# Patient Record
Sex: Male | Born: 1940
Health system: Southern US, Community
[De-identification: ages and names within clinical notes are randomized; demographics above are authoritative.]

## PROBLEM LIST (undated history)

## (undated) DIAGNOSIS — D126 Benign neoplasm of colon, unspecified: Secondary | ICD-10-CM

## (undated) DIAGNOSIS — M069 Rheumatoid arthritis, unspecified: Secondary | ICD-10-CM

## (undated) DIAGNOSIS — K449 Diaphragmatic hernia without obstruction or gangrene: Secondary | ICD-10-CM

## (undated) DIAGNOSIS — Z9889 Other specified postprocedural states: Secondary | ICD-10-CM

## (undated) DIAGNOSIS — K5790 Diverticulosis of intestine, part unspecified, without perforation or abscess without bleeding: Secondary | ICD-10-CM

## (undated) DIAGNOSIS — K219 Gastro-esophageal reflux disease without esophagitis: Secondary | ICD-10-CM

## (undated) DIAGNOSIS — I10 Essential (primary) hypertension: Secondary | ICD-10-CM

## (undated) DIAGNOSIS — G4733 Obstructive sleep apnea (adult) (pediatric): Secondary | ICD-10-CM

## (undated) DIAGNOSIS — J849 Interstitial pulmonary disease, unspecified: Secondary | ICD-10-CM

## (undated) DIAGNOSIS — K649 Unspecified hemorrhoids: Secondary | ICD-10-CM

## (undated) DIAGNOSIS — J9611 Chronic respiratory failure with hypoxia: Secondary | ICD-10-CM

## (undated) DIAGNOSIS — Z8719 Personal history of other diseases of the digestive system: Secondary | ICD-10-CM

## (undated) DIAGNOSIS — Z9989 Dependence on other enabling machines and devices: Secondary | ICD-10-CM

## (undated) HISTORY — DX: Gastro-esophageal reflux disease without esophagitis: K21.9

## (undated) HISTORY — PX: CIRCUMCISION: SUR203

## (undated) HISTORY — DX: Other specified postprocedural states: Z98.890

## (undated) HISTORY — DX: Personal history of other diseases of the digestive system: Z87.19

## (undated) HISTORY — DX: Essential (primary) hypertension: I10

## (undated) HISTORY — DX: Unspecified hemorrhoids: K64.9

## (undated) HISTORY — DX: Diaphragmatic hernia without obstruction or gangrene: K44.9

## (undated) HISTORY — DX: Benign neoplasm of colon, unspecified: D12.6

## (undated) HISTORY — DX: Rheumatoid arthritis, unspecified: M06.9

## (undated) HISTORY — DX: Diverticulosis of intestine, part unspecified, without perforation or abscess without bleeding: K57.90

---

## 1984-02-25 ENCOUNTER — Encounter: Payer: Self-pay | Admitting: Internal Medicine

## 1985-08-24 ENCOUNTER — Encounter: Payer: Self-pay | Admitting: Internal Medicine

## 1999-05-03 ENCOUNTER — Encounter: Payer: Self-pay | Admitting: General Surgery

## 1999-05-03 ENCOUNTER — Encounter: Admission: RE | Admit: 1999-05-03 | Discharge: 1999-05-03 | Payer: Self-pay | Admitting: General Surgery

## 1999-05-03 ENCOUNTER — Encounter: Payer: Self-pay | Admitting: Internal Medicine

## 1999-05-07 ENCOUNTER — Encounter: Payer: Self-pay | Admitting: General Surgery

## 1999-05-07 ENCOUNTER — Encounter: Admission: RE | Admit: 1999-05-07 | Discharge: 1999-05-07 | Payer: Self-pay | Admitting: General Surgery

## 1999-05-08 ENCOUNTER — Encounter (INDEPENDENT_AMBULATORY_CARE_PROVIDER_SITE_OTHER): Payer: Self-pay | Admitting: *Deleted

## 1999-05-08 ENCOUNTER — Ambulatory Visit (HOSPITAL_BASED_OUTPATIENT_CLINIC_OR_DEPARTMENT_OTHER): Admission: RE | Admit: 1999-05-08 | Discharge: 1999-05-08 | Payer: Self-pay | Admitting: General Surgery

## 2000-04-29 HISTORY — PX: HEMORRHOID SURGERY: SHX153

## 2001-02-09 ENCOUNTER — Encounter: Payer: Self-pay | Admitting: Internal Medicine

## 2001-02-10 ENCOUNTER — Other Ambulatory Visit: Admission: RE | Admit: 2001-02-10 | Discharge: 2001-02-10 | Payer: Self-pay | Admitting: Internal Medicine

## 2001-02-10 ENCOUNTER — Encounter (INDEPENDENT_AMBULATORY_CARE_PROVIDER_SITE_OTHER): Payer: Self-pay | Admitting: Specialist

## 2001-02-10 ENCOUNTER — Encounter: Payer: Self-pay | Admitting: Internal Medicine

## 2001-02-10 DIAGNOSIS — D126 Benign neoplasm of colon, unspecified: Secondary | ICD-10-CM | POA: Insufficient documentation

## 2001-02-18 ENCOUNTER — Ambulatory Visit (HOSPITAL_COMMUNITY): Admission: RE | Admit: 2001-02-18 | Discharge: 2001-02-18 | Payer: Self-pay | Admitting: Internal Medicine

## 2001-02-18 ENCOUNTER — Encounter: Payer: Self-pay | Admitting: Internal Medicine

## 2001-02-18 DIAGNOSIS — K210 Gastro-esophageal reflux disease with esophagitis: Secondary | ICD-10-CM

## 2004-10-14 ENCOUNTER — Emergency Department (HOSPITAL_COMMUNITY): Admission: EM | Admit: 2004-10-14 | Discharge: 2004-10-14 | Payer: Self-pay | Admitting: Emergency Medicine

## 2004-10-24 ENCOUNTER — Ambulatory Visit: Payer: Self-pay | Admitting: Internal Medicine

## 2005-12-18 ENCOUNTER — Ambulatory Visit: Payer: Self-pay | Admitting: Internal Medicine

## 2005-12-24 ENCOUNTER — Ambulatory Visit: Payer: Self-pay | Admitting: Internal Medicine

## 2005-12-24 DIAGNOSIS — K219 Gastro-esophageal reflux disease without esophagitis: Secondary | ICD-10-CM | POA: Insufficient documentation

## 2005-12-24 DIAGNOSIS — K5730 Diverticulosis of large intestine without perforation or abscess without bleeding: Secondary | ICD-10-CM | POA: Insufficient documentation

## 2005-12-24 DIAGNOSIS — K222 Esophageal obstruction: Secondary | ICD-10-CM

## 2005-12-24 DIAGNOSIS — K649 Unspecified hemorrhoids: Secondary | ICD-10-CM | POA: Insufficient documentation

## 2005-12-24 DIAGNOSIS — K449 Diaphragmatic hernia without obstruction or gangrene: Secondary | ICD-10-CM | POA: Insufficient documentation

## 2007-02-19 ENCOUNTER — Ambulatory Visit: Payer: Self-pay | Admitting: Internal Medicine

## 2007-06-25 DIAGNOSIS — M069 Rheumatoid arthritis, unspecified: Secondary | ICD-10-CM | POA: Insufficient documentation

## 2008-04-25 ENCOUNTER — Telehealth: Payer: Self-pay | Admitting: Internal Medicine

## 2008-04-27 ENCOUNTER — Encounter: Payer: Self-pay | Admitting: Internal Medicine

## 2008-05-02 ENCOUNTER — Encounter: Payer: Self-pay | Admitting: Internal Medicine

## 2008-06-08 ENCOUNTER — Telehealth: Payer: Self-pay | Admitting: Internal Medicine

## 2009-10-03 ENCOUNTER — Ambulatory Visit: Payer: Self-pay | Admitting: Internal Medicine

## 2009-10-03 DIAGNOSIS — R141 Gas pain: Secondary | ICD-10-CM

## 2009-10-03 DIAGNOSIS — R143 Flatulence: Secondary | ICD-10-CM

## 2009-10-03 DIAGNOSIS — R142 Eructation: Secondary | ICD-10-CM

## 2010-05-31 NOTE — Procedures (Signed)
Summary: Colonoscopy   Colonoscopy  Procedure date:  02/10/2001  Findings:      Location:  [facility].  Results: Polyp.  Results: Hemorrhoids.     Results: Diverticulosis.         Procedures Next Due Date:    Colonoscopy: 01/2006  Colonoscopy  Procedure date:  02/10/2001  Findings:      [facility]:   [facility]  Results: Polyp.  Results: Hemorrhoids.     Results: Diverticulosis.         Procedures Next Due Date:    Colonoscopy: 01/2006 Patient Name: [name], [name] MRN: [id number] Procedure Procedures: Colonoscopy CPT: 54098.    with polypectomy. CPT: A3573898.  Personnel: Endoscopist: Wilhemina Bonito. Marina Goodell, MD.  Exam Location: Exam performed in Outpatient Clinic. Outpatient  Patient Consent: Procedure, Alternatives, Risks and Benefits discussed, consent obtained, from patient.  Indications  Average Risk Screening Routine.  History  Pre-Exam Physical: Performed Feb 10, 2001. Cardio-pulmonary exam, Rectal exam, HEENT exam , Abdominal exam, Extremity exam, Neurological exam, Mental status exam WNL.  Exam Exam: Extent of exam reached: Cecum, extent intended: Cecum.  The cecum was identified by appendiceal orifice and IC valve. Patient position: on left side. Colon retroflexion performed. Images taken. ASA Classification: I. Tolerance: excellent.  Monitoring: Pulse and BP monitoring, Oximetry used. Supplemental O2 given.  Colon Prep Used Golytely for colon prep. Prep results: excellent.  Sedation Meds: Patient assessed and found to be appropriate for moderate (conscious) sedation. Fentanyl 100 mcg. Versed 5 mg.  Findings NORMAL EXAM: Cecum.  - DIVERTICULOSIS: Sigmoid Colon. ICD9: Diverticulosis, Colon: 562.10.  POLYP: Rectum, Maximum size: 3 mm. sessile polyp. Procedure:  snare with cautery, removed, retrieved, Polyp sent to pathology. ICD9: Colon Polyps: 211.3.  HEMORRHOIDS: Internal. ICD9: Hemorrhoids, Internal: 455.0.    Assessment Abnormal examination, see findings above.  Diagnoses: 562.10: Diverticulosis, Colon.  455.0: Hemorrhoids, Internal.  211.3: Colon Polyps.   Events  Unplanned Interventions: No intervention was required.  Unplanned Events: There were no complications. Plans Patient Education: Patient given standard instructions for: Polyps.  Disposition: After procedure patient sent to recovery. After recovery patient sent home.  Scheduling/Referral: Await pathology to schedule patient. Colonoscopy, to Wilhemina Bonito. Marina Goodell, MD, in 3 or 5 years pending path.,    This report was created from the original endoscopy report, which was reviewed and signed by the above listed endoscopist.   cc:  Andi Devon, MD

## 2010-05-31 NOTE — Assessment & Plan Note (Signed)
Summary: gas and belching    History of Present Illness Visit Type: Follow-up Visit Primary GI MD: Pedro Flemings MD Primary Provider: Arvella Nigh, MD Chief Complaint: Severe GERD, off/on for years, increased frequency and duration History of Present Illness:   70 year old with rheumatoid arthritis, dyslipidemia, hypertension, and GERD. He also has a history of peptic stricture of the esophagus requiring dilation. He presents today with the chief complaint of increased intestinal gas as manifested by belching and bloating. He reports having had this problem intermittently for years. However, worse over the past 5 months. Sometimes has soreness in the back. Reliably, symptoms are improved with belching. Niaspan and Diovan are new medications over the past 5 months. He cannot identify any particular factors that exacerbated or relieved gas.Marland Kitchen He takes omeprazole for his GERD. No pyrosis, dysphagia, or abdominal pain. His weight has fluctuated. For these symptoms, an abdominal ultrasound performed Sep 18, 2009(reviewed) was essentially unremarkable. Specifically, no gallstones or abnormalities of the visualized pancreas. He denies change in bowel habits. His screening colonoscopy is up-to-date.   GI Review of Systems    Reports acid reflux, belching, bloating, chest pain, and  nausea.      Denies abdominal pain, dysphagia with liquids, dysphagia with solids, heartburn, loss of appetite, vomiting, vomiting blood, weight loss, and  weight gain.      Reports diverticulosis.     Denies anal fissure, black tarry stools, change in bowel habit, constipation, diarrhea, fecal incontinence, heme positive stool, hemorrhoids, irritable bowel syndrome, jaundice, light color stool, liver problems, rectal bleeding, and  rectal pain. Preventive Screening-Counseling & Management  Alcohol-Tobacco     Smoking Status: never      Drug Use:  no.      Current Medications (verified): 1)  Omeprazole 20 Mg Cpdr  (Omeprazole) .Marland Kitchen.. 1 Capsule By Mouth Once Daily 2)  Methotrexate 2.5 Mg Tabs (Methotrexate Sodium) .... Take 8 Tablets Once Weekly 3)  Folic Acid 400 Mcg Tabs (Folic Acid) .... Once Daily 4)  Niaspan 500 Mg Cr-Tabs (Niacin (Antihyperlipidemic)) .... Once Daily 5)  Diovan 40 Mg Tabs (Valsartan) .... Take 1/2 Tablet By Mouth Daily  Allergies (verified): No Known Drug Allergies  Past History:  Past Medical History: Reviewed history from 06/25/2007 and no changes required. Current Problems:  ARTHRITIS, RHEUMATOID (ICD-714.0) COLONIC POLYPS, HYPERPLASTIC (ICD-211.3) ESOPHAGITIS, REFLUX (ICD-530.11) HEMORRHOIDS (ICD-455.6) DIVERTICULOSIS, COLON (ICD-562.10) ESOPHAGEAL STRICTURE (ICD-530.3) HIATAL HERNIA (ICD-553.3) GERD (ICD-530.81)  Past Surgical History: Unremarkable  Family History: No FH of Colon Cancer:  Social History: Occupation: Retired Patient has never smoked.  Alcohol Use - no Illicit Drug Use - no Smoking Status:  never Drug Use:  no  Review of Systems       The patient complains of allergy/sinus, arthritis/joint pain, back pain, change in vision, cough, and muscle pains/cramps.  The patient denies anemia, anxiety-new, blood in urine, breast changes/lumps, confusion, coughing up blood, depression-new, fainting, fatigue, fever, headaches-new, hearing problems, heart murmur, heart rhythm changes, itching, menstrual pain, night sweats, nosebleeds, pregnancy symptoms, shortness of breath, skin rash, sleeping problems, sore throat, swelling of feet/legs, swollen lymph glands, thirst - excessive , urination - excessive , urination changes/pain, urine leakage, vision changes, and voice change.    Vital Signs:  Patient profile:   70 year old male Height:      69 inches Weight:      202 pounds BMI:     29.94 Pulse rate:   68 / minute Pulse rhythm:   regular BP sitting:   120 /  74  (left arm) Cuff size:   regular  Vitals Entered By: [name] CMA Duncan Dull) (Pedro  7,  2011 8:37 AM)  Physical Exam  General:  Well developed, well nourished, no acute distress. Head:  Normocephalic and atraumatic. Eyes:  PERRLA, no icterus. Nose:  No deformity, discharge,  or lesions. Mouth:  No deformity or lesions,  Neck:  Supple; no masses or thyromegaly. Chest Wall:  Symmetrical,  no deformities . Lungs:  Clear throughout to auscultation. Heart:  Regular rate and rhythm; no murmurs, rubs,  or bruits. Abdomen:  Soft, nontender and nondistended. No masses, hepatosplenomegaly or hernias noted. Normal bowel sounds. Msk:  Symmetrical with no gross deformities. Normal posture. Pulses:  Normal pulses noted. Extremities:  No clubbing, cyanosis, edema or deformities noted. Neurologic:  Alert and  oriented x4;  Skin:  Intact without significant lesions or rashes. Psych:  Alert and cooperative. Normal mood and affect.   Impression & Recommendations:  Problem # 1:  FLATULENCE-GAS-BLOATING (ICD-787.3) chronic but somewhat worsening problems with intestinal gas as manifested principally by belching. No alarm features. Prior endoscopy and colonoscopy as documented as well as recent ultrasound. Symptoms may be secondary to diet or recent medication additions.  Plan: #1. Anti-gas and flatulence dietary sheet reviewed and then provided #2. Educational session on intestinal gas. In addition, gas brochure provided for review #3. Prescribe probiotic Align one daily for 2 weeks. Samples given to cover this treatment. If this is helpful, he may use the probiotic on demand #4. Followup p.r.n.  Problem # 2:  GERD (ICD-530.81) GERD. Symptoms controlled with omeprazole  Plan: #1. Continue omeprazole 20 mg daily #2. Continue reflux precautions  Problem # 3:  ESOPHAGEAL STRICTURE (ICD-530.3) history of esophageal stricture requiring dilation. Currently asymptomatic post dilation on PPI.  Plan: #1. Continue PPI #2. Repeat esophageal dilation p.r.n. recurrent dysphagia  Patient  Instructions: 1)  Take Align 1 by mouth once daily x 2 weeks.   2)  Gas brochure and prevention diet given to patient. 3)  Please schedule a follow-up appointment as needed.  4)  The medication list was reviewed and reconciled.  All changed / newly prescribed medications were explained.  A complete medication list was provided to the patient / caregiver.

## 2010-05-31 NOTE — Procedures (Signed)
Summary: EGD   EGD  Procedure date:  02/18/2001  Findings:      Findings: Esophagitis  Findings: Stricture:  GERD  Hiatal Hernia Location: [facility]   Patient Name: [name], [name] MRN: [id number] Procedure Procedures: Panendoscopy (EGD) CPT: 43235.    with esophageal dilation. CPT: G9296129.  Personnel: Endoscopist: Wilhemina Bonito. Marina Goodell, MD.  Exam Location: Exam performed in Endoscopy Suite.  Patient Consent: Procedure, Alternatives, Risks and Benefits discussed, consent obtained,  Indications Symptoms: Dysphagia.  History  Pre-Exam Physical: Performed Feb 18, 2001  Cardio-pulmonary exam, HEENT exam, Abdominal exam, Extremity exam, Neurological exam, Mental status exam WNL.  Exam Exam Info: Maximum depth of insertion Duodenum, intended Duodenum. Patient position: on left side. Vocal cords visualized. Gastric retroflexion performed. Images taken. ASA Classification: I. Tolerance: excellent.  Sedation Meds: Demerol 100 mg. Versed 10 mg.  Monitoring: BP and pulse monitoring done. Oximetry used. Supplemental O2 given  Fluoroscopy: Fluoroscopy was used.  Findings STRICTURE / STENOSIS: Stricture in Distal Esophagus.  Constriction: partial. Etiology: benign due to reflux. 38 cm from mouth. ICD9: Esophageal Stricture: 530.3. Comment: associated esophagitis (mild) present.  - Dilation: Cardia. Procedure was performed under Fluoroscopy. Wire Guided/Savary (Wilson-Cook) dilator used, Diameter: 18 mm, No Resistance, No Heme present on extraction. Patient tolerance excellent. Outcome: successful.  HIATAL HERNIA: Regular, 3 cms. in length. ICD9: Hernia, Hiatal: 553.3.  Assessment Abnormal examination, see findings above.  Diagnoses: 530.11: Esophagitis, Reflux.  530.81: GERD.  553.3: Hernia, Hiatal.  530.3: Esophageal Stricture.   Events  Unplanned Intervention: No unplanned interventions were required.  Unplanned Events: There were no  complications. Plans Instructions: Nothing to eat or drink for 2 hrs.  Clear or full liquids: 2 hrs. Resume previous diet: am.  Medication(s): PPI: Lansoprazole/Prevacid 30 mg QD, starting Feb 18, 2001   Patient Education: Patient given standard instructions for: Reflux. Stenosis / Stricture.  Disposition: After procedure patient sent to recovery. After recovery patient sent home.  Scheduling: Call office for appointment, to Wilhemina Bonito. Marina Goodell, MD, in about 6 weeks    This report was created from the original endoscopy report, which was reviewed and signed by the above listed endoscopist.   cc:  Andi Devon, MD

## 2010-09-11 NOTE — Assessment & Plan Note (Signed)
[facility]                         GASTROENTEROLOGY OFFICE NOTE   NAME:Mills, Pedro                       MRN:          [id number]  DATE:02/19/2007                            DOB:          24-Dec-1940    HISTORY:  [name] presents today for followup regarding management  of his reflux disease. He is a 70 year old gentleman with a history of  reflux disease complicated by peptic stricture for which he underwent  upper endoscopy with esophageal dilation on December 24, 2005. Post  dilation he has remained on Prevacid 30 mg daily. Since that time, he  has done well with no heartburn, indigestion or recurrent dysphagia. He  requests medication refill. No obvious appreciable medication side  effects. His other medical problem is rheumatoid arthritis. This has  been under good control.   CURRENT MEDICATIONS:  1. Folic acid.  2. Methotrexate.  3. Prevacid.  4. Aspirin.   PHYSICAL EXAMINATION:  Is a well-appearing male in no acute distress.  Blood pressure 132/70, heart rate 70, weight 218.4 pounds.  HEENT: Sclerae anicteric.  LUNGS:  Are clear.  HEART: Is regular.  ABDOMEN: Soft without tenderness, mass or hernia.   IMPRESSION:  1. Gastroesophageal reflux disease complicated by peptic stricture.      Currently asymptomatic post dilation on Prevacid.  2. Screening colonoscopy. Surveillance colonoscopy for history of      colon polyps. He is up-to-date as of December 24, 2005 as his polyp      was actually hyperplastic, followup in 10 years from that date      recommended.     Wilhemina Bonito. Marina Goodell, MD  Electronically Signed    JNP/MedQ  DD: 02/19/2007  DT: 02/19/2007  Job #: [id number]

## 2010-09-14 NOTE — Assessment & Plan Note (Signed)
[facility]                           GASTROENTEROLOGY OFFICE NOTE   NAME:[name], [name]                       MRN:          [id number]  DATE:12/18/2005                            DOB:          [birth year]    REFERRING PHYSICIAN:  Gabriel Earing, MD   REASON FOR CONSULTATION:  Dysphagia.   HISTORY OF PRESENT ILLNESS:  This is a 70 year old gentleman with a history  of gastroesophageal reflux disease with associated peptic stricture  requiring esophageal dilation, and a history of colon polyps.  He presents  today upon referral from Dr. Andi Devon regarding dysphagia.  The patient last  underwent upper endoscopy with esophageal dilation in October of 2002.  At  that time he was noted to have esophagitis and a sliding hiatal hernia.  In  addition, an esophageal stricture which was dilated to 18 mm.  He did well  until the past 2 to 3 months, when he has had intermittent dysphagia,  principally to large pills.  No problems with foods or meats.  He has  continued on Prevacid 30 mg daily.  No significant heartburn or indigestion.  He also reports to me that he has had some problems with some lower back  discomfort, nausea, sweats, for which he saw Dr. Andi Devon.  This has  resolved.  Also some lower abdominal cramping discomfort.  The bowel  movements have been regular without bleeding.  Last colonoscopy was in  October of 2002.  This revealed hemorrhoids, diverticulosis and a diminutive  cecal polyp which was removed, and found to be hyperplastic.   PAST MEDICAL HISTORY:  1. Reflux disease.  2. Rheumatoid arthritis.   PAST SURGICAL HISTORY:  None.   FAMILY HISTORY/SOCIAL HISTORY:  As previously recorded.   CURRENT MEDICATIONS:  1. Folic acid 1 mg daily.  2. Methotrexate 20 mg weekly.  3. Prevacid 30 mg daily.   PHYSICAL EXAMINATION:  GENERAL:  Well-appearing male in no acute distress.  He is alert and oriented.  VITAL SIGNS:  Blood pressure  146/86, heart rate is 74 and regular, weight is  213.8 pounds.  HEENT:  Sclerae are anicteric, conjunctivae are pink, oral mucosa is intact,  no adenopathy.  LUNGS:  Clear.  HEART:  Regular.  ABDOMEN:  Soft without tenderness, mass or hernia.  Good bowel sounds heard.   LABORATORY:  Laboratories from October 07, 2005, reveal normal comprehensive  metabolic panel, H. pylori antibody, and serum lipase.   IMPRESSION:  1. Gastroesophageal reflux disease.  The patient now with recurrent      dysphagia, likely due to recurrent esophageal stricture.  2. History of colon polyps.  Also diverticulosis on colonoscopy in 2002,      some intermittent lower abdominal cramping discomfort.  Nonspecific.   RECOMMENDATIONS:  1. Upper endoscopy with possible esophageal dilation.  The nature of the      procedure as well as the risks, benefits and alternatives have been      reviewed.  He understood and agreed to proceed.  2. Continue Prevacid 30 mg daily.  3. Schedule a screening colonoscopy.  The nature of the  procedure as well      as the risks, benefits and alternatives were reviewed.  He understood      and agreed to proceed.  He prefers OsmoPrep.                                   Wilhemina Bonito. Eda Keys., MD   JNP/MedQ  DD:  12/18/2005  DT:  12/19/2005  Job #:  [id number]   cc:   Gabriel Earing, MD

## 2010-11-14 ENCOUNTER — Other Ambulatory Visit: Payer: Self-pay | Admitting: Internal Medicine

## 2011-05-22 ENCOUNTER — Other Ambulatory Visit: Payer: Self-pay | Admitting: Internal Medicine

## 2011-11-17 ENCOUNTER — Other Ambulatory Visit: Payer: Self-pay | Admitting: Internal Medicine

## 2012-02-12 ENCOUNTER — Other Ambulatory Visit: Payer: Self-pay | Admitting: Rheumatology

## 2012-02-12 DIAGNOSIS — IMO0002 Reserved for concepts with insufficient information to code with codable children: Secondary | ICD-10-CM

## 2012-02-19 ENCOUNTER — Ambulatory Visit
Admission: RE | Admit: 2012-02-19 | Discharge: 2012-02-19 | Disposition: A | Discharge status: Home / Self Care | Payer: Medicare Other | Source: Ambulatory Visit | Attending: Rheumatology | Admitting: Rheumatology

## 2012-02-19 DIAGNOSIS — IMO0002 Reserved for concepts with insufficient information to code with codable children: Secondary | ICD-10-CM

## 2012-05-15 ENCOUNTER — Other Ambulatory Visit: Payer: Self-pay | Admitting: Internal Medicine

## 2012-05-30 ENCOUNTER — Other Ambulatory Visit: Payer: Self-pay | Admitting: Internal Medicine

## 2012-09-16 ENCOUNTER — Other Ambulatory Visit: Payer: Self-pay | Admitting: Internal Medicine

## 2013-03-11 ENCOUNTER — Other Ambulatory Visit: Payer: Self-pay | Admitting: Internal Medicine

## 2013-03-16 ENCOUNTER — Encounter: Payer: Self-pay | Admitting: Internal Medicine

## 2013-04-14 ENCOUNTER — Encounter: Payer: Self-pay | Admitting: Internal Medicine

## 2013-04-14 ENCOUNTER — Ambulatory Visit (INDEPENDENT_AMBULATORY_CARE_PROVIDER_SITE_OTHER): Payer: Medicare Other | Admitting: Internal Medicine

## 2013-04-14 VITALS — BP 116/70 | HR 76 | Ht 69.0 in | Wt 212.4 lb

## 2013-04-14 DIAGNOSIS — K222 Esophageal obstruction: Secondary | ICD-10-CM

## 2013-04-14 DIAGNOSIS — K219 Gastro-esophageal reflux disease without esophagitis: Secondary | ICD-10-CM

## 2013-04-14 MED ORDER — OMEPRAZOLE 20 MG PO CPDR: 20.0000 mg | DELAYED_RELEASE_CAPSULE | Freq: Every day | ORAL | Status: DC

## 2013-04-14 NOTE — Progress Notes (Signed)
HISTORY OF PRESENT ILLNESS:  [name] is a 72 y.o. male with hypertension, rheumatoid arthritis, and GERD complicated by peptic stricture for which he is undergone prior esophageal dilation in August 2007. He presents today for followup regarding management of his chronic GERD. He was last evaluated June 2011. For his reflux he takes omeprazole 20 mg daily. On medication he denies any reflux symptoms such as pyrosis or regurgitation. No recurrent dysphagia. No appreciable medication side effects. He is accompanied today by his wife. His last colonoscopy in 2007 was negative for neoplasia. Routine followup in 2017 recommended. His only GI complaint is that of belching, which is more problematic to his wife.  REVIEW OF SYSTEMS:  All non-GI ROS negative upon comprehensive review except for throat clearing  Past Medical History  Diagnosis Date  . GERD (gastroesophageal reflux disease)   . Rheumatoid arthritis   . Hypertension     History reviewed. No pertinent past surgical history.  Social History [name]  reports that he has never smoked. He has never used smokeless tobacco. He reports that he does not drink alcohol or use illicit drugs.  family history is negative for Colon cancer, Throat cancer, Diabetes, Heart disease, Kidney disease, and Liver disease.  No Known Allergies     PHYSICAL EXAMINATION: Vital signs: BP 116/70  Pulse 76  Ht 5\' 9"  (1.753 m)  Wt 212 lb 6.4 oz (96.344 kg)  BMI 31.35 kg/m2 General: Well-developed, well-nourished, no acute distress HEENT: Sclerae are anicteric, conjunctiva pink. Oral mucosa intact Lungs: Clear Heart: Regular Abdomen: soft, nontender, nondistended, no obvious ascites, no peritoneal signs, normal bowel sounds. No organomegaly. Extremities: No edema Psychiatric: alert and oriented x3. Cooperative   ASSESSMENT:  #1. GERD. Good control of reflux symptoms on omeprazole #2. History of peptic stricture status post dilation. No  recurrent dysphagia #3. Colon cancer screening. Hyperplastic polyp 2002. Colonoscopy in 2007 with mild diverticulosis and hemorrhoids.  PLAN:  #1. Reflux precautions #2. Refill omeprazole 20 mg daily #3. Followup as needed for recurrent dysphagia. Otherwise, routine followup for GERD management in 2 years #4. Routine repeat screening colonoscopy around August 2007

## 2013-04-14 NOTE — Patient Instructions (Signed)
We have sent the following medications to your pharmacy for you to pick up at your convenience:  Prilosec  Please follow up with Dr. Marina Goodell in 2 years

## 2013-09-28 ENCOUNTER — Emergency Department (HOSPITAL_COMMUNITY): Payer: Medicare Other

## 2013-09-28 ENCOUNTER — Emergency Department (HOSPITAL_COMMUNITY)
Admission: EM | Admit: 2013-09-28 | Discharge: 2013-09-28 | Disposition: A | Discharge status: Home / Self Care | Payer: Medicare Other | Attending: Emergency Medicine | Admitting: Emergency Medicine

## 2013-09-28 ENCOUNTER — Encounter (HOSPITAL_COMMUNITY): Payer: Self-pay | Admitting: Emergency Medicine

## 2013-09-28 DIAGNOSIS — Z79899 Other long term (current) drug therapy: Secondary | ICD-10-CM | POA: Insufficient documentation

## 2013-09-28 DIAGNOSIS — M069 Rheumatoid arthritis, unspecified: Secondary | ICD-10-CM | POA: Insufficient documentation

## 2013-09-28 DIAGNOSIS — I10 Essential (primary) hypertension: Secondary | ICD-10-CM | POA: Insufficient documentation

## 2013-09-28 DIAGNOSIS — Z792 Long term (current) use of antibiotics: Secondary | ICD-10-CM | POA: Insufficient documentation

## 2013-09-28 DIAGNOSIS — J159 Unspecified bacterial pneumonia: Secondary | ICD-10-CM | POA: Insufficient documentation

## 2013-09-28 DIAGNOSIS — K219 Gastro-esophageal reflux disease without esophagitis: Secondary | ICD-10-CM | POA: Insufficient documentation

## 2013-09-28 DIAGNOSIS — J189 Pneumonia, unspecified organism: Secondary | ICD-10-CM

## 2013-09-28 LAB — BASIC METABOLIC PANEL
BUN: 17 mg/dL (ref 6–23)
CHLORIDE: 99 meq/L (ref 96–112)
CO2: 24 mEq/L (ref 19–32)
CREATININE: 1.15 mg/dL (ref 0.50–1.35)
Calcium: 9.3 mg/dL (ref 8.4–10.5)
GFR calc non Af Amer: 61 mL/min — ABNORMAL LOW (ref >90)
GFR, EST AFRICAN AMERICAN: 71 mL/min — AB (ref >90)
Glucose, Bld: 143 mg/dL — ABNORMAL HIGH (ref 70–99)
Potassium: 4.8 mEq/L (ref 3.7–5.3)
Sodium: 136 mEq/L — ABNORMAL LOW (ref 137–147)

## 2013-09-28 LAB — CBC
HEMATOCRIT: 38.9 % — AB (ref 39.0–52.0)
Hemoglobin: 13.1 g/dL (ref 13.0–17.0)
MCH: 30.5 pg (ref 26.0–34.0)
MCHC: 33.7 g/dL (ref 30.0–36.0)
MCV: 90.5 fL (ref 78.0–100.0)
Platelets: 275 10*3/uL (ref 150–400)
RBC: 4.3 MIL/uL (ref 4.22–5.81)
RDW: 15.1 % (ref 11.5–15.5)
WBC: 10.5 10*3/uL (ref 4.0–10.5)

## 2013-09-28 MED ORDER — LEVOFLOXACIN 500 MG PO TABS
500.0000 mg | ORAL_TABLET | Freq: Once | ORAL | Status: AC
  Administered 2013-09-28: 500 mg via ORAL
  Filled 2013-09-28: qty 1

## 2013-09-28 MED ORDER — LEVOFLOXACIN 500 MG PO TABS: 500.0000 mg | ORAL_TABLET | Freq: Every day | ORAL | Status: DC

## 2013-09-28 MED ORDER — ACETAMINOPHEN 325 MG PO TABS
650.0000 mg | ORAL_TABLET | Freq: Once | ORAL | Status: AC
  Administered 2013-09-28: 650 mg via ORAL
  Filled 2013-09-28: qty 2

## 2013-09-28 NOTE — Discharge Instructions (Signed)

## 2013-09-28 NOTE — ED Provider Notes (Signed)
CSN: [id number]     Arrival date & time 09/28/13  1626 History   First MD Initiated Contact with Patient 09/28/13 1652     Chief Complaint  Patient presents with  . Cough  . Shortness of Breath  . Fever     (Consider location/radiation/quality/duration/timing/severity/associated sxs/prior Treatment) HPI  This is a 73 year old male who presents with cough and shortness of breath.  Patient reports approximately 10 days the symptoms. Patient reports a dry cough that has become progressively worse. He was seen in urgent care last weekend and given doxycycline. He did not have a chest x-ray at that time. He states that since that time he has had fevers at home to 100.4. He last took Tylenol this morning at 8:30 AM. He reports associated shortness of breath and pain with coughing that is in his neck and shoulder blade. He reports generalized fatigue. He denies any chest pain.  Past Medical History  Diagnosis Date  . GERD (gastroesophageal reflux disease)   . Rheumatoid arthritis   . Hypertension    History reviewed. No pertinent past surgical history. Family History  Problem Relation Age of Onset  . Colon cancer Neg Hx   . Throat cancer Neg Hx   . Diabetes Neg Hx   . Heart disease Neg Hx   . Kidney disease Neg Hx   . Liver disease Neg Hx    History  Substance Use Topics  . Smoking status: Never Smoker   . Smokeless tobacco: Never Used  . Alcohol Use: No    Review of Systems  Constitutional: Positive for fever and fatigue.  Respiratory: Positive for cough and shortness of breath. Negative for chest tightness.   Cardiovascular: Negative.  Negative for chest pain and leg swelling.  Gastrointestinal: Negative.  Negative for nausea, vomiting and abdominal pain.  Genitourinary: Negative.  Negative for dysuria.  Musculoskeletal: Negative for back pain.  Skin: Negative for rash.  Neurological: Negative for headaches.  Psychiatric/Behavioral: Negative for confusion.  All other  systems reviewed and are negative.     Allergies  Review of patient's allergies indicates no known allergies.  Home Medications   Prior to Admission medications   Medication Sig Start Date End Date Taking? Authorizing Provider  acetaminophen (TYLENOL) 325 MG tablet Take 325 mg by mouth every 6 (six) hours as needed (pain).   Yes Historical Provider, MD  doxycycline (DORYX) 100 MG EC tablet Take 100 mg by mouth 2 (two) times daily.   Yes Historical Provider, MD  latanoprost (XALATAN) 0.005 % ophthalmic solution Place 1 drop into both eyes at bedtime.    Yes Historical Provider, MD  methotrexate (RHEUMATREX) 2.5 MG tablet Take 4 tablets every Thursday and Friday   Yes Historical Provider, MD  montelukast (SINGULAIR) 10 MG tablet Take 10 mg by mouth at bedtime.   Yes Historical Provider, MD  omeprazole (PRILOSEC) 20 MG capsule Take 1 capsule (20 mg total) by mouth daily. 04/14/13  Yes Irene Shipper, MD  valsartan-hydrochlorothiazide (DIOVAN-HCT) 80-12.5 MG per tablet Take 1 tablet by mouth daily.   Yes Historical Provider, MD  levofloxacin (LEVAQUIN) 500 MG tablet Take 1 tablet (500 mg total) by mouth daily. 09/28/13   Merryl Hacker, MD   BP 146/63  Pulse 100  Temp(Src) 100.4 F (38 C) (Oral)  Resp 18  SpO2 95% Physical Exam  Nursing note and vitals reviewed. Constitutional: He is oriented to person, place, and time. No distress.  Elderly  HENT:  Head: Normocephalic  and atraumatic.  Mouth/Throat: Oropharynx is clear and moist.  Eyes: Pupils are equal, round, and reactive to light.  Neck: Neck supple.  Cardiovascular: Normal rate, regular rhythm and normal heart sounds.   No murmur heard. Pulmonary/Chest: Effort normal and breath sounds normal. No respiratory distress. He has no wheezes.  Fine crackles noted at the bilateral bases  Abdominal: Soft. Bowel sounds are normal. There is no tenderness. There is no rebound.  Musculoskeletal: He exhibits no edema.  Neurological: He is  alert and oriented to person, place, and time.  Skin: Skin is warm and dry.  Psychiatric: He has a normal mood and affect.    ED Course  Procedures (including critical care time) Labs Review Labs Reviewed  BASIC METABOLIC PANEL - Abnormal; Notable for the following:    Sodium 136 (*)    Glucose, Bld 143 (*)    GFR calc non Af Amer 61 (*)    GFR calc Af Amer 71 (*)    All other components within normal limits  CBC - Abnormal; Notable for the following:    HCT 38.9 (*)    All other components within normal limits    Imaging Review Dg Chest 2 View (if Patient Has Fever And/or Copd)  09/28/2013   CLINICAL DATA:  Cough, shortness of breath, fever.  EXAM: CHEST  2 VIEW  COMPARISON:  None.  FINDINGS: Trachea is midline. Heart size normal. There is bibasilar airspace disease, left greater than right. No pleural fluid.  IMPRESSION: Bibasilar airspace disease, left greater than right, worrisome for pneumonia. Consider follow up to clearing as malignancy at the left lung base cannot be definitively excluded.   Electronically Signed   By: Lorin Picket M.D.   On: 09/28/2013 17:32     EKG Interpretation   Date/Time:  Tuesday September 28 2013 17:12:12 EDT Ventricular Rate:  96 PR Interval:  188 QRS Duration: 75 QT Interval:  319 QTC Calculation: 403 R Axis:   75 Text Interpretation:  Sinus rhythm Consider left atrial enlargement  Anteroseptal infarct, old No prior for comparison Confirmed by HORTON  MD,  Loma Sousa ([id number]) on 09/28/2013 6:14:30 PM      MDM   Final diagnoses:  Community acquired pneumonia    Patient presents with shortness of breath, cough, and low-grade fevers. He is nontoxic on exam. He does have a temperature 100.4. Fine crackles at the bases. No significant tachypnea work of breathing. EKG reassuring. Basic labwork obtained and no evidence of leukocytosis. Chest x-ray suggestive of pneumonia; however malignancy cannot be ruled out. Patient is a nonsmoker and has never  smoked. They recommend followup x-ray for clearing. Patient has been on doxycycline as an outpatient but this does not have the best coverage. Discussed with the patient and his wife switching him to Levaquin for a second trial of outpatient management given that his labs are reassuring and he is well-appearing on exam. He was given one dose of Levaquin here. Port score is 51 which makes him rest class III. I think it is reasonable at this time to discharge with new antibiotic. He ambulated without difficulty and maintained oxygen saturations. He is to followup with his primary physician for repeat chest x-ray in 3-4 weeks to ensure clearing. Patient stated understanding. He was given strict return precautions.  After history, exam, and medical workup I feel the patient has been appropriately medically screened and is safe for discharge home. Pertinent diagnoses were discussed with the patient. Patient was given return precautions.  Merryl Hacker, MD 09/28/13 [phone]

## 2013-09-28 NOTE — ED Notes (Signed)
Pt ambulated in hallway without difficulty. O2 saturation maintained above 94%, HR 95.

## 2013-09-28 NOTE — ED Notes (Signed)
Pt reports cough started last Tuesday, has gotten progressively worse, went to urgent care last weekend. Prescribe abx, still taking abx, abx has helped but "has not cleared everything all the way up".  Reports neck and shoulder blade/back area soreness 5/10 from coughing. Cough at present non productive. Reports some SOB.  Pt febrile, pt took took tylenol 500mg  PO at 0830.

## 2013-10-25 ENCOUNTER — Other Ambulatory Visit: Payer: Self-pay | Admitting: Family Medicine

## 2013-10-25 ENCOUNTER — Ambulatory Visit
Admission: RE | Admit: 2013-10-25 | Discharge: 2013-10-25 | Disposition: A | Discharge status: Home / Self Care | Payer: Medicare Other | Source: Ambulatory Visit | Attending: Family Medicine | Admitting: Family Medicine

## 2013-10-25 DIAGNOSIS — J189 Pneumonia, unspecified organism: Secondary | ICD-10-CM

## 2014-04-15 ENCOUNTER — Other Ambulatory Visit: Payer: Self-pay | Admitting: Internal Medicine

## 2014-06-02 DIAGNOSIS — I10 Essential (primary) hypertension: Secondary | ICD-10-CM | POA: Diagnosis not present

## 2014-06-02 DIAGNOSIS — E0800 Diabetes mellitus due to underlying condition with hyperosmolarity without nonketotic hyperglycemic-hyperosmolar coma (NKHHC): Secondary | ICD-10-CM | POA: Diagnosis not present

## 2014-06-02 DIAGNOSIS — E782 Mixed hyperlipidemia: Secondary | ICD-10-CM | POA: Diagnosis not present

## 2014-06-10 DIAGNOSIS — H401222 Low-tension glaucoma, left eye, moderate stage: Secondary | ICD-10-CM | POA: Diagnosis not present

## 2014-06-10 DIAGNOSIS — H401211 Low-tension glaucoma, right eye, mild stage: Secondary | ICD-10-CM | POA: Diagnosis not present

## 2014-10-11 DIAGNOSIS — Z79899 Other long term (current) drug therapy: Secondary | ICD-10-CM | POA: Diagnosis not present

## 2014-10-11 DIAGNOSIS — M059 Rheumatoid arthritis with rheumatoid factor, unspecified: Secondary | ICD-10-CM | POA: Diagnosis not present

## 2014-11-09 DIAGNOSIS — E782 Mixed hyperlipidemia: Secondary | ICD-10-CM | POA: Diagnosis not present

## 2014-11-09 DIAGNOSIS — I10 Essential (primary) hypertension: Secondary | ICD-10-CM | POA: Diagnosis not present

## 2014-11-09 DIAGNOSIS — R7309 Other abnormal glucose: Secondary | ICD-10-CM | POA: Diagnosis not present

## 2014-11-10 ENCOUNTER — Encounter: Payer: Self-pay | Admitting: Nurse Practitioner

## 2014-11-10 DIAGNOSIS — D582 Other hemoglobinopathies: Secondary | ICD-10-CM | POA: Diagnosis not present

## 2014-11-25 ENCOUNTER — Encounter: Payer: Self-pay | Admitting: Gastroenterology

## 2014-12-01 ENCOUNTER — Ambulatory Visit (INDEPENDENT_AMBULATORY_CARE_PROVIDER_SITE_OTHER): Payer: Medicare PPO | Admitting: Nurse Practitioner

## 2014-12-01 ENCOUNTER — Other Ambulatory Visit (INDEPENDENT_AMBULATORY_CARE_PROVIDER_SITE_OTHER): Payer: Medicare PPO

## 2014-12-01 ENCOUNTER — Encounter: Payer: Self-pay | Admitting: Nurse Practitioner

## 2014-12-01 VITALS — BP 116/68 | HR 72 | Ht 68.5 in | Wt 212.1 lb

## 2014-12-01 DIAGNOSIS — Z79899 Other long term (current) drug therapy: Secondary | ICD-10-CM

## 2014-12-01 DIAGNOSIS — D649 Anemia, unspecified: Secondary | ICD-10-CM | POA: Insufficient documentation

## 2014-12-01 LAB — CBC
HEMATOCRIT: 40.8 % (ref 39.0–52.0)
Hemoglobin: 13.9 g/dL (ref 13.0–17.0)
MCHC: 34.2 g/dL (ref 30.0–36.0)
MCV: 90.7 fl (ref 78.0–100.0)
Platelets: 199 10*3/uL (ref 150.0–400.0)
RBC: 4.5 Mil/uL (ref 4.22–5.81)
RDW: 15.6 % — ABNORMAL HIGH (ref 11.5–15.5)
WBC: 4.1 10*3/uL (ref 4.0–10.5)

## 2014-12-01 LAB — IBC PANEL
IRON: 102 ug/dL (ref 42–165)
Saturation Ratios: 31.5 % (ref 20.0–50.0)
Transferrin: 231 mg/dL (ref 212.0–360.0)

## 2014-12-01 LAB — FOLATE: FOLATE: 13 ng/mL (ref >5.9)

## 2014-12-01 LAB — FERRITIN: Ferritin: 195.6 ng/mL (ref 22.0–322.0)

## 2014-12-01 LAB — VITAMIN B12: Vitamin B-12: 543 pg/mL (ref 211–911)

## 2014-12-01 NOTE — Progress Notes (Signed)
    HPI :   Patient is a 74 year old male known to Dr. Henrene Pastor. He has history of adenomatous colon polyps, surveillance colonoscopy due Aug 2017. Patient referred by PCP for evaluation of anemia. Recent hemoglobin 12.7. I called [facility] to see what baseline hgb is. Hemoglobin February was 14, October 2015 it was 13.8. His MCV is normal. Patient has no overt gastrointestinal bleeding. No gastrointestinal complaints such as bowel changes, nausea, abdominal pain, or weight loss. No NSAID use.  Past Medical History  Diagnosis Date  . GERD (gastroesophageal reflux disease)   . Rheumatoid arthritis   . Hypertension   . Diverticulosis   . Hemorrhoids     internal and external  . Adenomatous colon polyp   . Hiatal hernia   . S/P dilatation of esophageal stricture     Family History  Problem Relation Age of Onset  . Colon cancer Neg Hx   . Throat cancer Neg Hx   . Diabetes Neg Hx   . Heart disease Neg Hx   . Kidney disease Neg Hx   . Liver disease Neg Hx    History  Substance Use Topics  . Smoking status: Never Smoker   . Smokeless tobacco: Never Used  . Alcohol Use: No   Current Outpatient Prescriptions  Medication Sig Dispense Refill  . acetaminophen (TYLENOL) 325 MG tablet Take 325 mg by mouth every 6 (six) hours as needed (pain).    Marland Kitchen latanoprost (XALATAN) 0.005 % ophthalmic solution Place 1 drop into both eyes at bedtime.     . methotrexate (RHEUMATREX) 2.5 MG tablet Take 4 tablets every Thursday and Friday    . montelukast (SINGULAIR) 10 MG tablet Take 10 mg by mouth at bedtime.    Marland Kitchen omeprazole (PRILOSEC) 20 MG capsule TAKE 1 CAPSULE (20 MG TOTAL) BY MOUTH DAILY. 90 capsule 3  . valsartan-hydrochlorothiazide (DIOVAN-HCT) 80-12.5 MG per tablet Take 1 tablet by mouth daily.     No current facility-administered medications for this visit.   No Known Allergies   Review of Systems: All systems reviewed and negative except where noted in HPI.   Physical Exam: BP 116/68  mmHg  Pulse 72  Ht 5' 8.5" (1.74 m)  Wt 212 lb 2 oz (96.219 kg)  BMI 31.78 kg/m2 Constitutional: Pleasant,well-developed, black male in no acute distress. HEENT: Normocephalic and atraumatic. Conjunctivae are normal. No scleral icterus. Neck supple.  Cardiovascular: Normal rate, regular rhythm.  Pulmonary/chest: Effort normal and breath sounds normal. No wheezing, rales or rhonchi. Abdominal: Soft, nondistended, nontender. Bowel sounds active throughout. There are no masses palpable. No hepatomegaly. Rectal: scant brown, heme negative stool in vault Extremities: no edema Lymphadenopathy: No cervical adenopathy noted. Neurological: Alert and oriented to person place and time. Skin: Skin is warm and dry. No rashes noted. Psychiatric: Normal mood and affect. Behavior is normal.   ASSESSMENT AND PLAN:   1. Pleasant 74 year old male referred for anemia. Hgb down to 12.7 from 14.0 in Feb. No overt bleeding. Heme negative today. No GI symptoms. Will recheck hgb today. In addition will check iron studies. Patient not due for surveillance colonoscopy until 2017 but if hemoglobin truly dropping then will need to consider earlier colonoscopy and possibly EGD as well.   2. RA, on chronic methotrexate. LFTs normal.

## 2014-12-01 NOTE — Progress Notes (Signed)
Agree 

## 2014-12-01 NOTE — Patient Instructions (Signed)
Please go to the basement level to have your labs drawn.   We will call you with the lab results.

## 2014-12-07 DIAGNOSIS — M059 Rheumatoid arthritis with rheumatoid factor, unspecified: Secondary | ICD-10-CM | POA: Diagnosis not present

## 2014-12-07 DIAGNOSIS — D72819 Decreased white blood cell count, unspecified: Secondary | ICD-10-CM | POA: Diagnosis not present

## 2014-12-07 DIAGNOSIS — Z79899 Other long term (current) drug therapy: Secondary | ICD-10-CM | POA: Diagnosis not present

## 2015-01-16 DIAGNOSIS — Z79899 Other long term (current) drug therapy: Secondary | ICD-10-CM | POA: Diagnosis not present

## 2015-01-16 DIAGNOSIS — M0570 Rheumatoid arthritis with rheumatoid factor of unspecified site without organ or systems involvement: Secondary | ICD-10-CM | POA: Diagnosis not present

## 2015-02-10 DIAGNOSIS — H401211 Low-tension glaucoma, right eye, mild stage: Secondary | ICD-10-CM | POA: Diagnosis not present

## 2015-02-10 DIAGNOSIS — H401222 Low-tension glaucoma, left eye, moderate stage: Secondary | ICD-10-CM | POA: Diagnosis not present

## 2015-02-10 DIAGNOSIS — H2513 Age-related nuclear cataract, bilateral: Secondary | ICD-10-CM | POA: Diagnosis not present

## 2015-02-10 DIAGNOSIS — H25013 Cortical age-related cataract, bilateral: Secondary | ICD-10-CM | POA: Diagnosis not present

## 2015-02-10 DIAGNOSIS — H353130 Nonexudative age-related macular degeneration, bilateral, stage unspecified: Secondary | ICD-10-CM | POA: Diagnosis not present

## 2015-03-07 DIAGNOSIS — E782 Mixed hyperlipidemia: Secondary | ICD-10-CM | POA: Diagnosis not present

## 2015-03-07 DIAGNOSIS — R7309 Other abnormal glucose: Secondary | ICD-10-CM | POA: Diagnosis not present

## 2015-03-07 DIAGNOSIS — I10 Essential (primary) hypertension: Secondary | ICD-10-CM | POA: Diagnosis not present

## 2015-03-10 DIAGNOSIS — I10 Essential (primary) hypertension: Secondary | ICD-10-CM | POA: Diagnosis not present

## 2015-03-10 DIAGNOSIS — Z6830 Body mass index (BMI) 30.0-30.9, adult: Secondary | ICD-10-CM | POA: Diagnosis not present

## 2015-03-10 DIAGNOSIS — E785 Hyperlipidemia, unspecified: Secondary | ICD-10-CM | POA: Diagnosis not present

## 2015-04-16 ENCOUNTER — Other Ambulatory Visit: Payer: Self-pay | Admitting: Internal Medicine

## 2015-05-09 DIAGNOSIS — M0570 Rheumatoid arthritis with rheumatoid factor of unspecified site without organ or systems involvement: Secondary | ICD-10-CM | POA: Diagnosis not present

## 2015-05-09 DIAGNOSIS — M545 Low back pain: Secondary | ICD-10-CM | POA: Diagnosis not present

## 2015-05-09 DIAGNOSIS — M5416 Radiculopathy, lumbar region: Secondary | ICD-10-CM | POA: Diagnosis not present

## 2015-05-09 DIAGNOSIS — D72819 Decreased white blood cell count, unspecified: Secondary | ICD-10-CM | POA: Diagnosis not present

## 2015-05-09 DIAGNOSIS — Z79899 Other long term (current) drug therapy: Secondary | ICD-10-CM | POA: Diagnosis not present

## 2015-06-09 DIAGNOSIS — H401211 Low-tension glaucoma, right eye, mild stage: Secondary | ICD-10-CM | POA: Diagnosis not present

## 2015-06-09 DIAGNOSIS — H401222 Low-tension glaucoma, left eye, moderate stage: Secondary | ICD-10-CM | POA: Diagnosis not present

## 2015-07-03 DIAGNOSIS — M0570 Rheumatoid arthritis with rheumatoid factor of unspecified site without organ or systems involvement: Secondary | ICD-10-CM | POA: Diagnosis not present

## 2015-07-03 DIAGNOSIS — Z79899 Other long term (current) drug therapy: Secondary | ICD-10-CM | POA: Diagnosis not present

## 2015-07-29 DIAGNOSIS — F17200 Nicotine dependence, unspecified, uncomplicated: Secondary | ICD-10-CM | POA: Diagnosis not present

## 2015-07-29 DIAGNOSIS — J111 Influenza due to unidentified influenza virus with other respiratory manifestations: Secondary | ICD-10-CM | POA: Diagnosis not present

## 2015-07-29 DIAGNOSIS — J40 Bronchitis, not specified as acute or chronic: Secondary | ICD-10-CM | POA: Diagnosis not present

## 2015-07-31 DIAGNOSIS — J09X2 Influenza due to identified novel influenza A virus with other respiratory manifestations: Secondary | ICD-10-CM | POA: Diagnosis not present

## 2015-07-31 DIAGNOSIS — M0559 Rheumatoid polyneuropathy with rheumatoid arthritis of multiple sites: Secondary | ICD-10-CM | POA: Diagnosis not present

## 2015-08-08 DIAGNOSIS — M0559 Rheumatoid polyneuropathy with rheumatoid arthritis of multiple sites: Secondary | ICD-10-CM | POA: Diagnosis not present

## 2015-08-08 DIAGNOSIS — I10 Essential (primary) hypertension: Secondary | ICD-10-CM | POA: Diagnosis not present

## 2015-08-08 DIAGNOSIS — R7309 Other abnormal glucose: Secondary | ICD-10-CM | POA: Diagnosis not present

## 2015-08-10 DIAGNOSIS — J301 Allergic rhinitis due to pollen: Secondary | ICD-10-CM | POA: Diagnosis not present

## 2015-08-10 DIAGNOSIS — I10 Essential (primary) hypertension: Secondary | ICD-10-CM | POA: Diagnosis not present

## 2015-08-10 DIAGNOSIS — E785 Hyperlipidemia, unspecified: Secondary | ICD-10-CM | POA: Diagnosis not present

## 2015-08-10 DIAGNOSIS — M0559 Rheumatoid polyneuropathy with rheumatoid arthritis of multiple sites: Secondary | ICD-10-CM | POA: Diagnosis not present

## 2015-10-04 DIAGNOSIS — R05 Cough: Secondary | ICD-10-CM | POA: Diagnosis not present

## 2015-10-04 DIAGNOSIS — M0570 Rheumatoid arthritis with rheumatoid factor of unspecified site without organ or systems involvement: Secondary | ICD-10-CM | POA: Diagnosis not present

## 2015-10-04 DIAGNOSIS — Z79899 Other long term (current) drug therapy: Secondary | ICD-10-CM | POA: Diagnosis not present

## 2015-10-04 DIAGNOSIS — D72819 Decreased white blood cell count, unspecified: Secondary | ICD-10-CM | POA: Diagnosis not present

## 2015-10-04 DIAGNOSIS — M545 Low back pain: Secondary | ICD-10-CM | POA: Diagnosis not present

## 2015-10-04 DIAGNOSIS — M5416 Radiculopathy, lumbar region: Secondary | ICD-10-CM | POA: Diagnosis not present

## 2015-10-09 ENCOUNTER — Encounter: Payer: Self-pay | Admitting: Internal Medicine

## 2015-10-10 DIAGNOSIS — M5416 Radiculopathy, lumbar region: Secondary | ICD-10-CM | POA: Diagnosis not present

## 2015-10-10 DIAGNOSIS — Z79899 Other long term (current) drug therapy: Secondary | ICD-10-CM | POA: Diagnosis not present

## 2015-10-10 DIAGNOSIS — D72819 Decreased white blood cell count, unspecified: Secondary | ICD-10-CM | POA: Diagnosis not present

## 2015-10-10 DIAGNOSIS — M0570 Rheumatoid arthritis with rheumatoid factor of unspecified site without organ or systems involvement: Secondary | ICD-10-CM | POA: Diagnosis not present

## 2015-10-22 ENCOUNTER — Other Ambulatory Visit: Payer: Self-pay | Admitting: Internal Medicine

## 2015-11-07 ENCOUNTER — Encounter (HOSPITAL_COMMUNITY): Payer: Self-pay | Admitting: Emergency Medicine

## 2015-11-07 ENCOUNTER — Observation Stay (HOSPITAL_COMMUNITY)
Admission: EM | Admit: 2015-11-07 | Discharge: 2015-11-08 | Disposition: A | Discharge status: Home / Self Care | Payer: Medicare PPO | Attending: Internal Medicine | Admitting: Internal Medicine

## 2015-11-07 DIAGNOSIS — E860 Dehydration: Secondary | ICD-10-CM | POA: Diagnosis not present

## 2015-11-07 DIAGNOSIS — T502X5A Adverse effect of carbonic-anhydrase inhibitors, benzothiadiazides and other diuretics, initial encounter: Secondary | ICD-10-CM | POA: Insufficient documentation

## 2015-11-07 DIAGNOSIS — M79606 Pain in leg, unspecified: Secondary | ICD-10-CM | POA: Diagnosis not present

## 2015-11-07 DIAGNOSIS — Z79899 Other long term (current) drug therapy: Secondary | ICD-10-CM | POA: Diagnosis not present

## 2015-11-07 DIAGNOSIS — K219 Gastro-esophageal reflux disease without esophagitis: Secondary | ICD-10-CM | POA: Insufficient documentation

## 2015-11-07 DIAGNOSIS — E875 Hyperkalemia: Secondary | ICD-10-CM | POA: Insufficient documentation

## 2015-11-07 DIAGNOSIS — I10 Essential (primary) hypertension: Secondary | ICD-10-CM | POA: Diagnosis not present

## 2015-11-07 DIAGNOSIS — M069 Rheumatoid arthritis, unspecified: Secondary | ICD-10-CM | POA: Insufficient documentation

## 2015-11-07 DIAGNOSIS — N179 Acute kidney failure, unspecified: Principal | ICD-10-CM | POA: Insufficient documentation

## 2015-11-07 DIAGNOSIS — T465X5A Adverse effect of other antihypertensive drugs, initial encounter: Secondary | ICD-10-CM | POA: Diagnosis not present

## 2015-11-07 LAB — URINALYSIS, ROUTINE W REFLEX MICROSCOPIC
BILIRUBIN URINE: NEGATIVE
Glucose, UA: NEGATIVE mg/dL
Hgb urine dipstick: NEGATIVE
KETONES UR: NEGATIVE mg/dL
LEUKOCYTES UA: NEGATIVE
NITRITE: NEGATIVE
PH: 5.5 (ref 5.0–8.0)
Protein, ur: 30 mg/dL — AB
SPECIFIC GRAVITY, URINE: 1.018 (ref 1.005–1.030)

## 2015-11-07 LAB — CBC WITH DIFFERENTIAL/PLATELET
BASOS ABS: 0 10*3/uL (ref 0.0–0.1)
Basophils Relative: 0 %
EOS ABS: 0.1 10*3/uL (ref 0.0–0.7)
Eosinophils Relative: 1 %
HEMATOCRIT: 38.1 % — AB (ref 39.0–52.0)
HEMOGLOBIN: 12.7 g/dL — AB (ref 13.0–17.0)
Lymphocytes Relative: 19 %
Lymphs Abs: 1.3 10*3/uL (ref 0.7–4.0)
MCH: 30.8 pg (ref 26.0–34.0)
MCHC: 33.3 g/dL (ref 30.0–36.0)
MCV: 92.5 fL (ref 78.0–100.0)
Monocytes Absolute: 0.6 10*3/uL (ref 0.1–1.0)
Monocytes Relative: 9 %
NEUTROS ABS: 4.8 10*3/uL (ref 1.7–7.7)
Neutrophils Relative %: 71 %
Platelets: 204 10*3/uL (ref 150–400)
RBC: 4.12 MIL/uL — ABNORMAL LOW (ref 4.22–5.81)
RDW: 15.7 % — ABNORMAL HIGH (ref 11.5–15.5)
WBC: 6.8 10*3/uL (ref 4.0–10.5)

## 2015-11-07 LAB — BASIC METABOLIC PANEL
ANION GAP: 5 (ref 5–15)
BUN: 26 mg/dL — ABNORMAL HIGH (ref 6–20)
CALCIUM: 8.6 mg/dL — AB (ref 8.9–10.3)
CO2: 24 mmol/L (ref 22–32)
CREATININE: 2.03 mg/dL — AB (ref 0.61–1.24)
Chloride: 109 mmol/L (ref 101–111)
GFR, EST AFRICAN AMERICAN: 35 mL/min — AB (ref >60)
GFR, EST NON AFRICAN AMERICAN: 30 mL/min — AB (ref >60)
Glucose, Bld: 140 mg/dL — ABNORMAL HIGH (ref 65–99)
Potassium: 5.4 mmol/L — ABNORMAL HIGH (ref 3.5–5.1)
SODIUM: 138 mmol/L (ref 135–145)

## 2015-11-07 LAB — URINE MICROSCOPIC-ADD ON

## 2015-11-07 LAB — NA AND K (SODIUM & POTASSIUM), RAND UR
POTASSIUM UR: 47 mmol/L
Sodium, Ur: 91 mmol/L

## 2015-11-07 LAB — POTASSIUM: POTASSIUM: 4.2 mmol/L (ref 3.5–5.1)

## 2015-11-07 LAB — CK: Total CK: 245 U/L (ref 49–397)

## 2015-11-07 MED ORDER — SODIUM CHLORIDE 0.9 % IV BOLUS (SEPSIS)
1000.0000 mL | Freq: Once | INTRAVENOUS | Status: AC
  Administered 2015-11-07: 1000 mL via INTRAVENOUS

## 2015-11-07 MED ORDER — SODIUM CHLORIDE 0.9 % IV SOLN
INTRAVENOUS | Status: DC
  Administered 2015-11-07 – 2015-11-08 (×2): via INTRAVENOUS

## 2015-11-07 MED ORDER — MONTELUKAST SODIUM 10 MG PO TABS
10.0000 mg | ORAL_TABLET | Freq: Every day | ORAL | Status: DC
  Administered 2015-11-07: 10 mg via ORAL
  Filled 2015-11-07: qty 1

## 2015-11-07 MED ORDER — METHOTREXATE 2.5 MG PO TABS: 10.0000 mg | ORAL_TABLET | ORAL | Status: DC

## 2015-11-07 MED ORDER — LATANOPROST 0.005 % OP SOLN
1.0000 [drp] | Freq: Every day | OPHTHALMIC | Status: DC
  Administered 2015-11-07: 1 [drp] via OPHTHALMIC
  Filled 2015-11-07: qty 2.5

## 2015-11-07 MED ORDER — HEPARIN SODIUM (PORCINE) 5000 UNIT/ML IJ SOLN
5000.0000 [IU] | Freq: Three times a day (TID) | INTRAMUSCULAR | Status: DC
  Administered 2015-11-07 – 2015-11-08 (×2): 5000 [IU] via SUBCUTANEOUS
  Filled 2015-11-07 (×2): qty 1

## 2015-11-07 MED ORDER — METHOTREXATE 2.5 MG PO TABS: 7.5000 mg | ORAL_TABLET | ORAL | Status: DC

## 2015-11-07 NOTE — Progress Notes (Signed)
PHARMACY BRIEF NOTE: Methotrexate  By [facility] policy, methotrexate is automatically held for any of the following:   Hgb < 8  WBC < 3  Pltc < 100K  SCr > 1.5x baseline (or > 2 if baseline unknown)  AST or ALT >3x ULN  Bili > 1.5x ULN  Ascites or pleural effusion  Diarrhea - Grade 2 or higher  Ulcerative stomatitis  Unexplained pneumonitis / hypoxemia  Active infection  The order for methotrexate has been discontinued.  Please call pharmacy with any questions ([phone]).  Reuel Boom, PharmD, BCPS Pager: [phone] 11/07/2015, 8:58 PM

## 2015-11-07 NOTE — ED Notes (Signed)
Pt denies any muscle cramping at this time.

## 2015-11-07 NOTE — ED Notes (Signed)
Per EMS patient comes from home c/o cramping of feet, hands and calves.  Patient was working outside today when symptoms started.  Patient had approx 415mLNS in route via 20g in left AC.

## 2015-11-07 NOTE — ED Provider Notes (Signed)
CSN: [id number]     Arrival date & time 11/07/15  1616 History   First MD Initiated Contact with Patient 11/07/15 1626     Chief Complaint  Patient presents with  . muscle cramps   . Heat Exposure   [name] is a 75 y.o. male Who presents the emergency department complaining of muscle cramping while working outside today. Patient reports he was outside working on a fence when he began having cramping in his right hand, left leg and right leg. EMS was called and he reports on my exam but is now feeling back to normal. He reports this has happened previously when he was working outside in the heat. He denies feeling lightheaded or dizzy. He denies syncopal episode. He currently reports feeling back to normal. Patient denies fevers, numbness, tingling, weakness, abdominal pain, nausea, vomiting, diarrhea, rashes, headache, lightheadedness, dizziness.   The history is provided by the patient. No language interpreter was used.    Past Medical History  Diagnosis Date  . GERD (gastroesophageal reflux disease)   . Rheumatoid arthritis ([place])   . Hypertension   . Diverticulosis   . Hemorrhoids     internal and external  . Adenomatous colon polyp   . Hiatal hernia   . S/P dilatation of esophageal stricture    History reviewed. No pertinent past surgical history. Family History  Problem Relation Age of Onset  . Colon cancer Neg Hx   . Throat cancer Neg Hx   . Diabetes Neg Hx   . Heart disease Neg Hx   . Kidney disease Neg Hx   . Liver disease Neg Hx    Social History  Substance Use Topics  . Smoking status: Never Smoker   . Smokeless tobacco: Never Used  . Alcohol Use: No    Review of Systems  Constitutional: Negative for fever and chills.  HENT: Negative for congestion and sore throat.   Eyes: Negative for visual disturbance.  Respiratory: Negative for cough and shortness of breath.   Cardiovascular: Negative for chest pain and palpitations.  Gastrointestinal: Negative for  nausea, vomiting, abdominal pain and diarrhea.  Genitourinary: Negative for dysuria.  Musculoskeletal: Positive for myalgias. Negative for back pain, neck pain and neck stiffness.  Skin: Negative for rash and wound.  Neurological: Negative for dizziness, syncope, weakness, numbness and headaches.      Allergies  Review of patient's allergies indicates no known allergies.  Home Medications   Prior to Admission medications   Medication Sig Start Date End Date Taking? Authorizing Provider  acetaminophen (TYLENOL) 325 MG tablet Take 325 mg by mouth every 6 (six) hours as needed (pain).   Yes Historical Provider, MD  cetirizine (ZYRTEC) 10 MG tablet Take 10 mg by mouth daily as needed for allergies.   Yes Historical Provider, MD  IRON PO Take 2 capsules by mouth daily.   Yes Historical Provider, MD  latanoprost (XALATAN) 0.005 % ophthalmic solution Place 1 drop into both eyes at bedtime.    Yes Historical Provider, MD  LUTEIN PO Take 1 tablet by mouth daily.   Yes Historical Provider, MD  methotrexate (RHEUMATREX) 2.5 MG tablet Take 4 tablets every Thursday and Friday   Yes Historical Provider, MD  montelukast (SINGULAIR) 10 MG tablet Take 10 mg by mouth at bedtime.   Yes Historical Provider, MD  Multiple Vitamin (MULTIVITAMIN WITH MINERALS) TABS tablet Take 1 tablet by mouth daily.   Yes Historical Provider, MD  Omega-3 Fatty Acids (OMEGA 3 PO) Take 1  tablet by mouth daily.   Yes Historical Provider, MD  omeprazole (PRILOSEC) 20 MG capsule TAKE 1 CAPSULE (20 MG TOTAL) BY MOUTH DAILY. 10/23/15  Yes Irene Shipper, MD  valsartan-hydrochlorothiazide (DIOVAN-HCT) 80-12.5 MG per tablet Take 1 tablet by mouth daily.   Yes Historical Provider, MD   BP 147/82 mmHg  Pulse 83  Temp(Src) 98 F (36.7 C) (Oral)  Resp 18  SpO2 100% Physical Exam  Constitutional: He is oriented to person, place, and time. He appears well-developed and well-nourished. No distress.  Nontoxic appearing.  HENT:  Head:  Normocephalic and atraumatic.  Mouth/Throat: Oropharynx is clear and moist.  Eyes: Conjunctivae are normal. Pupils are equal, round, and reactive to light. Right eye exhibits no discharge. Left eye exhibits no discharge.  Neck: Neck supple. No JVD present.  Cardiovascular: Normal rate, regular rhythm, normal heart sounds and intact distal pulses.  Exam reveals no gallop and no friction rub.   No murmur heard. Pulmonary/Chest: Effort normal and breath sounds normal. No respiratory distress. He has no wheezes. He has no rales.  Abdominal: Soft. There is no tenderness. There is no guarding.  Musculoskeletal: Normal range of motion. He exhibits no edema or tenderness.  Good strength and range of motion of his bilateral upper and lower extremities. No lower extremity edema or tenderness.  Lymphadenopathy:    He has no cervical adenopathy.  Neurological: He is alert and oriented to person, place, and time. No cranial nerve deficit. Coordination normal.  Patient is alert and oriented 3. Cranial nerves are intact. Speech is clear coherent. Sensation is intact in his bilateral upper and lower extremities.  Skin: Skin is warm and dry. No rash noted. He is not diaphoretic. No erythema. No pallor.  Psychiatric: He has a normal mood and affect. His behavior is normal.  Nursing note and vitals reviewed.   ED Course  Procedures (including critical care time) Labs Review Labs Reviewed  BASIC METABOLIC PANEL - Abnormal; Notable for the following:    Potassium 5.4 (*)    Glucose, Bld 140 (*)    BUN 26 (*)    Creatinine, Ser 2.03 (*)    Calcium 8.6 (*)    GFR calc non Af Amer 30 (*)    GFR calc Af Amer 35 (*)    All other components within normal limits  CBC WITH DIFFERENTIAL/PLATELET - Abnormal; Notable for the following:    RBC 4.12 (*)    Hemoglobin 12.7 (*)    HCT 38.1 (*)    RDW 15.7 (*)    All other components within normal limits  URINALYSIS, ROUTINE W REFLEX MICROSCOPIC (NOT AT [facility]) -  Abnormal; Notable for the following:    APPearance CLOUDY (*)    Protein, ur 30 (*)    All other components within normal limits  URINE MICROSCOPIC-ADD ON - Abnormal; Notable for the following:    Squamous Epithelial / LPF 0-5 (*)    Bacteria, UA RARE (*)    Casts HYALINE CASTS (*)    All other components within normal limits  CK  POTASSIUM  CBC  CREATININE, SERUM  BASIC METABOLIC PANEL  CBC  NA AND K (SODIUM & POTASSIUM), RAND UR    Imaging Review No results found. I have personally reviewed and evaluated these images and lab results as part of my medical decision-making.   EKG Interpretation   Date/Time:  Tuesday November 07 2015 16:35:47 EDT Ventricular Rate:  81 PR Interval:    QRS Duration: 90 QT  Interval:  367 QTC Calculation: 426 R Axis:   72 Text Interpretation:  Sinus rhythm Prolonged PR interval Confirmed by  Alvino Chapel  MD, NATHAN [phone]) on 11/07/2015 5:31:40 PM      Filed Vitals:   11/07/15 1628 11/07/15 1635 11/07/15 1858  BP:  119/57 147/82  Pulse:  84 83  Temp: 97.4 F (36.3 C)  98 F (36.7 C)  TempSrc: Oral  Oral  Resp:   18  SpO2:  100% 100%     MDM   Meds given in ED:  Medications  latanoprost (XALATAN) 0.005 % ophthalmic solution 1 drop (not administered)  methotrexate (RHEUMATREX) tablet 10 mg (not administered)  montelukast (SINGULAIR) tablet 10 mg (not administered)  heparin injection 5,000 Units (not administered)  0.9 %  sodium chloride infusion (not administered)  sodium chloride 0.9 % bolus 1,000 mL (1,000 mLs Intravenous New Bag/Given 11/07/15 1707)  sodium chloride 0.9 % bolus 1,000 mL (1,000 mLs Intravenous New Bag/Given 11/07/15 1754)    Current Discharge Medication List      Final diagnoses:  AKI (acute kidney injury) ([place])  Hyperkalemia  Dehydration   This  is a 75 y.o. male Who presents the emergency department complaining of muscle cramping while working outside today. Patient reports he was outside working on a fence  when he began having cramping in his right hand, left leg and right leg. EMS was called and he reports on my exam but is now feeling back to normal. He reports this has happened previously when he was working outside in the heat. He denies feeling lightheaded or dizzy. He denies syncopal episode. He currently reports feeling back to normal. On exam the patient is afebrile nontoxic appearing. His abdomen is soft and nontender. Lungs are clear to auscultation bilaterally. He has no focal neurological deficits. BMP reveals hyperkalemia with a potassium of 5.4. Creatinine is elevated to 2.03. This is an increase from 1.15 from his most recent blood work. Patient with acute kidney injury. Urinalysis shows no hemoglobin or ketones. CK is 245. Patient received 2 L fluid bolus in the emergency department. Will admit for AKI and dehydration. Patient and family are in agreement with plan.  I consulted with Dr. Cathlean Sauer who accepted the patient for admission.  Temporary admission orders were placed for tele bed.   Repeat potassium was 4.2 after 2 L fluid bolus. No further intervention required in the ED for potassium.   This patient was discussed with Dr. Alvino Chapel who agrees with assessment and plan.   Waynetta Pean, PA-C 11/07/15 1933  Davonna Belling, MD 11/08/15 [phone]

## 2015-11-07 NOTE — ED Notes (Signed)
Informed Patient were waiting on UA

## 2015-11-07 NOTE — Progress Notes (Signed)
History and Physical    [name] Mills DOB: February 16, 1941 DOA: 11/07/2015  PCP: Elyn Peers, MD   Patient coming from: Home  Chief Complaint: Lower extremity Cramps.  HPI: [name] is a 75 y.o. male patient was brought into the hospital due to lower extremity cramps. Patient had developed significant pain in his lower extremities after being outdoors doing light work in his yard. When he got inside his home he felt weak, after getting a shower he developed significant pain on his lower extremities, it was crampy in nature, 10 out of 10 intensity, no improving factors, worse with movement, no associated chest pain or dyspnea.  Patient is on valsartan hydrochlorothiazide for blood pressure control. He takes methotrexate twice weekly for arthritis.  ED Course: IV fluids.  Review of Systems:  Gen. awake and alert, Pulmonary no shortness of breath, cough or hemoptysis Cardiovascular. No angina, claudication or syncope. Gastrointestinal no nausea, vomiting or diarrhea. Skin no rashes Hematology no easy bruisability or frequent infections Urology no dysuria or increased urinary frequency Endocrinology no tremors, heat or cold intolerance Psych no depression or anxiety Neurology no seizures or paresthesias  Past Medical History  Diagnosis Date  . GERD (gastroesophageal reflux disease)   . Rheumatoid arthritis ([place])   . Hypertension   . Diverticulosis   . Hemorrhoids     internal and external  . Adenomatous colon polyp   . Hiatal hernia   . S/P dilatation of esophageal stricture     History reviewed. No pertinent past surgical history.   reports that he has never smoked. He has never used smokeless tobacco. He reports that he does not drink alcohol or use illicit drugs.  No Known Allergies  Family History  Problem Relation Age of Onset  . Colon cancer Neg Hx   . Throat cancer Neg Hx   . Diabetes Neg Hx   . Heart disease Neg Hx   . Kidney disease Neg Hx     . Liver disease Neg Hx     Family history was reviewed and found to be noncontributory  Prior to Admission medications   Medication Sig Start Date End Date Taking? Authorizing Provider  acetaminophen (TYLENOL) 325 MG tablet Take 325 mg by mouth every 6 (six) hours as needed (pain).   Yes Historical Provider, MD  cetirizine (ZYRTEC) 10 MG tablet Take 10 mg by mouth daily as needed for allergies.   Yes Historical Provider, MD  IRON PO Take 2 capsules by mouth daily.   Yes Historical Provider, MD  latanoprost (XALATAN) 0.005 % ophthalmic solution Place 1 drop into both eyes at bedtime.    Yes Historical Provider, MD  LUTEIN PO Take 1 tablet by mouth daily.   Yes Historical Provider, MD  methotrexate (RHEUMATREX) 2.5 MG tablet Take 4 tablets every Thursday and Friday   Yes Historical Provider, MD  montelukast (SINGULAIR) 10 MG tablet Take 10 mg by mouth at bedtime.   Yes Historical Provider, MD  Multiple Vitamin (MULTIVITAMIN WITH MINERALS) TABS tablet Take 1 tablet by mouth daily.   Yes Historical Provider, MD  Omega-3 Fatty Acids (OMEGA 3 PO) Take 1 tablet by mouth daily.   Yes Historical Provider, MD  omeprazole (PRILOSEC) 20 MG capsule TAKE 1 CAPSULE (20 MG TOTAL) BY MOUTH DAILY. 10/23/15  Yes Irene Shipper, MD  valsartan-hydrochlorothiazide (DIOVAN-HCT) 80-12.5 MG per tablet Take 1 tablet by mouth daily.   Yes Historical Provider, MD    Physical Exam: Filed Vitals:   11/07/15 1628  11/07/15 1635  BP:  119/57  Pulse:  84  Temp: 97.4 F (36.3 C)   TempSrc: Oral   SpO2:  100%      Constitutional: NAD, calm, comfortable Filed Vitals:   11/07/15 1628 11/07/15 1635  BP:  119/57  Pulse:  84  Temp: 97.4 F (36.3 C)   TempSrc: Oral   SpO2:  100%   Eyes: PERRL, lids and conjunctivae normal, no pallor or icterus ENMT: Mucous membranes are dry. Posterior pharynx clear of any exudate or lesions.Normal dentition.  Neck: normal, supple, no masses, no thyromegaly Respiratory: clear to  auscultation bilaterally, no wheezing, no crackles. Normal respiratory effort. No accessory muscle use.  Cardiovascular: Regular rate and rhythm, no murmurs / rubs / gallops. No extremity edema. 2+ pedal pulses. No carotid bruits.  Abdomen: no tenderness, no masses palpated. No hepatosplenomegaly. Bowel sounds positive.  Musculoskeletal: no clubbing / cyanosis. No joint deformity upper and lower extremities. Good ROM, no contractures. Normal muscle tone.  Skin: no rashes, lesions, ulcers. No induration Neurologic: CN 2-12 grossly intact. Sensation intact, DTR normal. Strength 5/5 in all 4.  Psychiatric: Normal judgment and insight. Alert and oriented x 3. Normal mood.   Labs on Admission: I have personally reviewed following labs and imaging studies  CBC:  Recent Labs Lab 11/07/15 1641  WBC 6.8  NEUTROABS 4.8  HGB 12.7*  HCT 38.1*  MCV 92.5  PLT 0000000   Basic Metabolic Panel:  Recent Labs Lab 11/07/15 1641 11/07/15 1803  NA 138  --   K 5.4* 4.2  CL 109  --   CO2 24  --   GLUCOSE 140*  --   BUN 26*  --   CREATININE 2.03*  --   CALCIUM 8.6*  --    GFR: CrCl cannot be calculated (Unknown ideal weight.). Liver Function Tests: No results for input(s): AST, ALT, ALKPHOS, BILITOT, PROT, ALBUMIN in the last 168 hours. No results for input(s): LIPASE, AMYLASE in the last 168 hours. No results for input(s): AMMONIA in the last 168 hours. Coagulation Profile: No results for input(s): INR, PROTIME in the last 168 hours. Cardiac Enzymes:  Recent Labs Lab 11/07/15 1641  CKTOTAL 245   BNP (last 3 results) No results for input(s): PROBNP in the last 8760 hours. HbA1C: No results for input(s): HGBA1C in the last 72 hours. CBG: No results for input(s): GLUCAP in the last 168 hours. Lipid Profile: No results for input(s): CHOL, HDL, LDLCALC, TRIG, CHOLHDL, LDLDIRECT in the last 72 hours. Thyroid Function Tests: No results for input(s): TSH, T4TOTAL, FREET4, T3FREE, THYROIDAB  in the last 72 hours. Anemia Panel: No results for input(s): VITAMINB12, FOLATE, FERRITIN, TIBC, IRON, RETICCTPCT in the last 72 hours. Urine analysis:    Component Value Date/Time   COLORURINE YELLOW 11/07/2015 1804   APPEARANCEUR CLOUDY* 11/07/2015 1804   LABSPEC 1.018 11/07/2015 1804   PHURINE 5.5 11/07/2015 1804   GLUCOSEU NEGATIVE 11/07/2015 1804   HGBUR NEGATIVE 11/07/2015 1804   BILIRUBINUR NEGATIVE 11/07/2015 1804   KETONESUR NEGATIVE 11/07/2015 1804   PROTEINUR 30* 11/07/2015 1804   NITRITE NEGATIVE 11/07/2015 1804   LEUKOCYTESUR NEGATIVE 11/07/2015 1804   Sepsis Labs: !!!!!!!!!!!!!!!!!!!!!!!!!!!!!!!!!!!!!!!!!!!! @LABRCNTIP (procalcitonin:4,lacticidven:4) )No results found for this or any previous visit (from the past 240 hour(s)).   Radiological Exams on Admission: No results found.  EKG: Personally reviewed shows sinus rhythm with positive repolarization changes  Assessment/Plan Active Problems:   AKI (acute kidney injury) [facility])   This is a 75 year old gentleman who presents  to hospital with the chief complaint of lower extremity cramps. Apparently he did light work outdoors for about 4 hours. On initial physical examination he was found dehydrated. Sodium was 138, potassium was 5.4, his creatinine was 2.03, his glucose 140, his white count 6.8, hemoglobin 12.7, hematocrit 38. His EKG showing repolarization changes  Working diagnoses lower extremity the cramps 2 to hyperkalemia related to acute kidney injury  1. Cardiovascular. Hypertension we'll hold patient's antihypertensive agents, valsartan hydrochlorothiazide, to avoid hypotension or further kidney injury.  2. Pulmonary. No signs of volume overload. Continue oximetry monitor and supplemental oxygen per nasal cannula.  3. Nephrology. Acute kidney injury. We will continue normal saline at 100 ml per hour, will avoid hypotension or nephrotoxic agents. Repeat potassium is down to 4.2, will check urinary  electrolytes.  Patient is high risk of worsening kidney injury.  DVT prophylaxis: Heparin Code Status: Full Family Communication: I spoke with patient's family at the bedside and all questions were addressed. Key Information for patient's care was obtained   Tawni Millers MD [facility] Pager [phone]  If 7PM-7AM, please contact night-coverage [email or web address] Password [facility]  11/07/2015, 6:48 PM

## 2015-11-08 DIAGNOSIS — E860 Dehydration: Secondary | ICD-10-CM | POA: Diagnosis not present

## 2015-11-08 DIAGNOSIS — E875 Hyperkalemia: Secondary | ICD-10-CM | POA: Diagnosis not present

## 2015-11-08 DIAGNOSIS — N179 Acute kidney failure, unspecified: Secondary | ICD-10-CM | POA: Diagnosis not present

## 2015-11-08 LAB — CBC
HEMATOCRIT: 34 % — AB (ref 39.0–52.0)
HEMOGLOBIN: 11.7 g/dL — AB (ref 13.0–17.0)
MCH: 31.5 pg (ref 26.0–34.0)
MCHC: 34.4 g/dL (ref 30.0–36.0)
MCV: 91.4 fL (ref 78.0–100.0)
Platelets: 196 10*3/uL (ref 150–400)
RBC: 3.72 MIL/uL — ABNORMAL LOW (ref 4.22–5.81)
RDW: 16 % — AB (ref 11.5–15.5)
WBC: 6.2 10*3/uL (ref 4.0–10.5)

## 2015-11-08 LAB — BASIC METABOLIC PANEL
ANION GAP: 5 (ref 5–15)
BUN: 21 mg/dL — AB (ref 6–20)
CHLORIDE: 110 mmol/L (ref 101–111)
CO2: 24 mmol/L (ref 22–32)
Calcium: 8.5 mg/dL — ABNORMAL LOW (ref 8.9–10.3)
Creatinine, Ser: 1.13 mg/dL (ref 0.61–1.24)
GFR calc Af Amer: >60 mL/min (ref >60)
GFR calc non Af Amer: >60 mL/min (ref >60)
GLUCOSE: 102 mg/dL — AB (ref 65–99)
POTASSIUM: 3.7 mmol/L (ref 3.5–5.1)
Sodium: 139 mmol/L (ref 135–145)

## 2015-11-08 MED ORDER — AMLODIPINE BESYLATE 10 MG PO TABS
10.0000 mg | ORAL_TABLET | Freq: Every day | ORAL | Status: AC
Start: 2015-11-08 — End: ?

## 2015-11-08 MED ORDER — AMLODIPINE BESYLATE 10 MG PO TABS
10.0000 mg | ORAL_TABLET | Freq: Every day | ORAL | Status: DC
  Administered 2015-11-08: 10 mg via ORAL
  Filled 2015-11-08: qty 1

## 2015-11-08 NOTE — Discharge Summary (Signed)
[name] [id number] DOB: [birth year] DOA: 11/07/2015  PCP: Elyn Peers, MD  Admit date: 11/07/2015  Discharge date: 11/08/2015  Admitted From: Home   Disposition:  Home   Recommendations for Outpatient Follow-up:   Follow up with PCP in 1-2 weeks  PCP Please obtain BMP/CBC, 2 view CXR in 1week,  (see Discharge instructions)   PCP Please follow up on the following pending results: None   Home Health: None   Equipment/Devices: None  Discharge Condition: Stable  CODE STATUS: Full   Diet Recommendation: Heart healthy Consultations: None  Chief Complaint  Patient presents with  . muscle cramps   . Heat Exposure     Brief history of present illness from the day of admission and additional interim summary    [name] is a 75 y.o. male patient was brought into the hospital due to lower extremity cramps. Patient had developed significant pain in his lower extremities after being outdoors doing light work in his yard. When he got inside his home he felt weak, after getting a shower he developed significant pain on his lower extremities, it was crampy in nature, 10 out of 10 intensity, no improving factors, worse with movement, no associated chest pain or dyspnea.  Patient is on valsartan hydrochlorothiazide for blood pressure control. He takes methotrexate twice weekly for arthritis.  Hospital issues addressed    1.ARF due to dehydration from exposure to elements outdoors and being on HCTZ and ARB. Resolved after offending medication was held and patient was hydrated, creatinine back to baseline patient was symptom-free eager to go home. We will discontinue HCTZ ARB combination and place him on Norvasc which patient and wife both request, requested patient to follow with PCP in 2-3 days to get repeat  BMP and blood pressure check.  2. Hypertension. Switch to Norvasc at this time. PCP to monitor.  3. History of arthritis. Continue home medications unchanged.  4. GERD. On PPI  Discharge diagnosis     Active Problems:   AKI (acute kidney injury) ([place])   ARF (acute renal failure) ([place])    Discharge instructions    Discharge Instructions    Diet - low sodium heart healthy    Complete by:  As directed      Discharge instructions    Complete by:  As directed   Follow with Primary MD Elyn Peers, MD in 2-3 days   Get CBC, CMP, 2 view Chest X ray checked  by Primary MD or SNF MD in 5-7 days ( we routinely change or add medications that can affect your baseline labs and fluid status, therefore we recommend that you get the mentioned basic workup next visit with your PCP, your PCP may decide not to get them or add new tests based on their clinical decision)   Activity: As tolerated with Full fall precautions use walker/cane & assistance as needed   Disposition Home     Diet:   Heart Healthy    For Heart failure patients - Check your Weight same time  everyday, if you gain over 2 pounds, or you develop in leg swelling, experience more shortness of breath or chest pain, call your Primary MD immediately. Follow Cardiac Low Salt Diet and 1.5 lit/day fluid restriction.   On your next visit with your primary care physician please Get Medicines reviewed and adjusted.   Please request your Prim.MD to go over all Hospital Tests and Procedure/Radiological results at the follow up, please get all Hospital records sent to your Prim MD by signing hospital release before you go home.   If you experience worsening of your admission symptoms, develop shortness of breath, life threatening emergency, suicidal or homicidal thoughts you must seek medical attention immediately by calling 911 or calling your MD immediately  if symptoms less severe.  You Must read complete instructions/literature  along with all the possible adverse reactions/side effects for all the Medicines you take and that have been prescribed to you. Take any new Medicines after you have completely understood and accpet all the possible adverse reactions/side effects.   Do not drive, operate heavy machinery, perform activities at heights, swimming or participation in water activities or provide baby sitting services if your were admitted for syncope or siezures until you have seen by Primary MD or a Neurologist and advised to do so again.  Do not drive when taking Pain medications.    Do not take more than prescribed Pain, Sleep and Anxiety Medications  Special Instructions: If you have smoked or chewed Tobacco  in the last 2 yrs please stop smoking, stop any regular Alcohol  and or any Recreational drug use.  Wear Seat belts while driving.   Please note  You were cared for by a hospitalist during your hospital stay. If you have any questions about your discharge medications or the care you received while you were in the hospital after you are discharged, you can call the unit and asked to speak with the hospitalist on call if the hospitalist that took care of you is not available. Once you are discharged, your primary care physician will handle any further medical issues. Please note that NO REFILLS for any discharge medications will be authorized once you are discharged, as it is imperative that you return to your primary care physician (or establish a relationship with a primary care physician if you do not have one) for your aftercare needs so that they can reassess your need for medications and monitor your lab values.     Increase activity slowly    Complete by:  As directed            Discharge Medications     Medication List    STOP taking these medications        valsartan-hydrochlorothiazide 80-12.5 MG tablet  Commonly known as:  DIOVAN-HCT      TAKE these medications        acetaminophen  325 MG tablet  Commonly known as:  TYLENOL  Take 325 mg by mouth every 6 (six) hours as needed (pain).     amLODipine 10 MG tablet  Commonly known as:  NORVASC  Take 1 tablet (10 mg total) by mouth daily.     cetirizine 10 MG tablet  Commonly known as:  ZYRTEC  Take 10 mg by mouth daily as needed for allergies.     IRON PO  Take 2 capsules by mouth daily.     latanoprost 0.005 % ophthalmic solution  Commonly known as:  XALATAN  Place 1 drop into both  eyes at bedtime.     LUTEIN PO  Take 1 tablet by mouth daily.     methotrexate 2.5 MG tablet  Commonly known as:  RHEUMATREX  7.5 mg. Take 4 tablets every Thursday and Friday     montelukast 10 MG tablet  Commonly known as:  SINGULAIR  Take 10 mg by mouth at bedtime.     multivitamin with minerals Tabs tablet  Take 1 tablet by mouth daily.     OMEGA 3 PO  Take 1 tablet by mouth daily.     omeprazole 20 MG capsule  Commonly known as:  PRILOSEC  TAKE 1 CAPSULE (20 MG TOTAL) BY MOUTH DAILY.        No Known Allergies      Follow-up Information    Follow up with Elyn Peers, MD. Schedule an appointment as soon as possible for a visit in 3 days.   Specialty:  Family Medicine   Contact information:   [address] [place] [address] [phone]       Major procedures and Radiology Reports - PLEASE review detailed and final reports thoroughly  -         No results found.  Micro Results      No results found for this or any previous visit (from the past 240 hour(s)).  Mills   Subjective    Shashwat Meldrum Mills has no headache,no chest abdominal pain,no new weakness tingling or numbness, feels much better wants to go home Mills.     Objective   Blood pressure 131/59, pulse 70, temperature 98.3 F (36.8 C), temperature source Oral, resp. rate 16, height 5\' 8"  (1.727 m), weight 96.2 kg (212 lb 1.3 oz), SpO2 100 %.   Intake/Output Summary (Last 24 hours) at 11/08/15 1018 Last data filed at  11/08/15 0854  Gross per 24 hour  Intake 1083.33 ml  Output   1050 ml  Net  33.33 ml    Exam Awake Alert, Oriented x 3, No new F.N deficits, Normal affect [place].AT,PERRAL Supple Neck,No JVD, No cervical lymphadenopathy appriciated.  Symmetrical Chest wall movement, Good air movement bilaterally, CTAB RRR,No Gallops,Rubs or new Murmurs, No Parasternal Heave +ve B.Sounds, Abd Soft, Non tender, No organomegaly appriciated, No rebound -guarding or rigidity. No Cyanosis, Clubbing or edema, No new Rash or bruise   Data Review   CBC w Diff: Lab Results  Component Value Date   WBC 6.2 11/08/2015   HGB 11.7* 11/08/2015   HCT 34.0* 11/08/2015   PLT 196 11/08/2015   LYMPHOPCT 19 11/07/2015   MONOPCT 9 11/07/2015   EOSPCT 1 11/07/2015   BASOPCT 0 11/07/2015    CMP: Lab Results  Component Value Date   NA 139 11/08/2015   K 3.7 11/08/2015   CL 110 11/08/2015   CO2 24 11/08/2015   BUN 21* 11/08/2015   CREATININE 1.13 11/08/2015  .   Total Time in preparing paper work, data evaluation and todays exam - 35 minutes  Thurnell Lose M.D on 11/08/2015 at 10:18 AM  [facility]   Office  [phone]

## 2015-11-08 NOTE — Care Management Note (Signed)
Case Management Note  Patient Details  Name: [name] MRN: [id number] Date of Birth: January 21, 1941  Subjective/Objective: 75 y/o m admitted w/AKI. From home. Condition code 44 given-patient voiced understanding.                   Action/Plan:d/c home no needs or orders.   Expected Discharge Date:   (unknown)               Expected Discharge Plan:  Home/Self Care  In-House Referral:     Discharge planning Services     Post Acute Care Choice:    Choice offered to:     DME Arranged:    DME Agency:     HH Arranged:    [place] Agency:     Status of Service:  Completed, signed off  If discussed at [employer] of Stay Meetings, dates discussed:    Additional Comments:  Dessa Phi, RN 11/08/2015, 11:34 AM

## 2015-11-09 DIAGNOSIS — I10 Essential (primary) hypertension: Secondary | ICD-10-CM | POA: Diagnosis not present

## 2015-11-09 DIAGNOSIS — R7309 Other abnormal glucose: Secondary | ICD-10-CM | POA: Diagnosis not present

## 2015-11-09 DIAGNOSIS — E782 Mixed hyperlipidemia: Secondary | ICD-10-CM | POA: Diagnosis not present

## 2015-11-09 DIAGNOSIS — M0559 Rheumatoid polyneuropathy with rheumatoid arthritis of multiple sites: Secondary | ICD-10-CM | POA: Diagnosis not present

## 2015-11-20 ENCOUNTER — Ambulatory Visit (AMBULATORY_SURGERY_CENTER): Payer: Self-pay | Admitting: *Deleted

## 2015-11-20 VITALS — Ht 68.0 in | Wt 214.0 lb

## 2015-11-20 DIAGNOSIS — Z1211 Encounter for screening for malignant neoplasm of colon: Secondary | ICD-10-CM

## 2015-11-20 MED ORDER — NA SULFATE-K SULFATE-MG SULF 17.5-3.13-1.6 GM/177ML PO SOLN: ORAL | 0 refills | Status: DC

## 2015-11-20 NOTE — Progress Notes (Signed)
Patient denies any allergies to eggs or soy. Patient denies any problems with anesthesia/sedation. Patient denies any oxygen use at home and does not take any diet/weight loss medications.  

## 2015-11-23 DIAGNOSIS — E785 Hyperlipidemia, unspecified: Secondary | ICD-10-CM | POA: Diagnosis not present

## 2015-11-23 DIAGNOSIS — I10 Essential (primary) hypertension: Secondary | ICD-10-CM | POA: Diagnosis not present

## 2015-11-23 DIAGNOSIS — M0559 Rheumatoid polyneuropathy with rheumatoid arthritis of multiple sites: Secondary | ICD-10-CM | POA: Diagnosis not present

## 2015-11-30 DIAGNOSIS — Z79899 Other long term (current) drug therapy: Secondary | ICD-10-CM | POA: Diagnosis not present

## 2015-11-30 DIAGNOSIS — M0570 Rheumatoid arthritis with rheumatoid factor of unspecified site without organ or systems involvement: Secondary | ICD-10-CM | POA: Diagnosis not present

## 2015-12-01 ENCOUNTER — Encounter: Payer: Self-pay | Admitting: Internal Medicine

## 2015-12-01 ENCOUNTER — Ambulatory Visit (AMBULATORY_SURGERY_CENTER): Payer: Medicare PPO | Admitting: Internal Medicine

## 2015-12-01 VITALS — BP 131/77 | HR 62 | Temp 97.5°F | Resp 12 | Ht 68.0 in | Wt 214.0 lb

## 2015-12-01 DIAGNOSIS — K635 Polyp of colon: Secondary | ICD-10-CM | POA: Diagnosis not present

## 2015-12-01 DIAGNOSIS — Z1211 Encounter for screening for malignant neoplasm of colon: Secondary | ICD-10-CM | POA: Diagnosis not present

## 2015-12-01 DIAGNOSIS — M069 Rheumatoid arthritis, unspecified: Secondary | ICD-10-CM | POA: Diagnosis not present

## 2015-12-01 DIAGNOSIS — D122 Benign neoplasm of ascending colon: Secondary | ICD-10-CM

## 2015-12-01 DIAGNOSIS — D123 Benign neoplasm of transverse colon: Secondary | ICD-10-CM | POA: Diagnosis not present

## 2015-12-01 DIAGNOSIS — Z86010 Personal history of colonic polyps: Secondary | ICD-10-CM | POA: Diagnosis not present

## 2015-12-01 MED ORDER — SODIUM CHLORIDE 0.9 % IV SOLN: 500.0000 mL | INTRAVENOUS | Status: DC

## 2015-12-01 NOTE — Progress Notes (Signed)
Called to room to assist during endoscopic procedure.  Patient ID and intended procedure confirmed with present staff. Received instructions for my participation in the procedure from the performing physician.  

## 2015-12-01 NOTE — Progress Notes (Signed)
To recovery, report to [place], RN, VSS 

## 2015-12-01 NOTE — Op Note (Signed)
[facility] Patient Name: [name] Procedure Date: 12/01/2015 8:37 AM MRN: [id number] Endoscopist: Docia Chuck. Henrene Pastor , MD Age: 75 Referring MD:  Date of Birth: [birth year] Gender: Male Account #: [id number] Procedure:                Colonoscopy, with cold snare polypectomy x 2 Indications:              Screening for colorectal malignant neoplasm.                            Previous examinations 2002 in 2007 negative for                            neoplasia Medicines:                Monitored Anesthesia Care Procedure:                Pre-Anesthesia Assessment:                           - Prior to the procedure, a History and Physical                            was performed, and patient medications and                            allergies were reviewed. The patient's tolerance of                            previous anesthesia was also reviewed. The risks                            and benefits of the procedure and the sedation                            options and risks were discussed with the patient.                            All questions were answered, and informed consent                            was obtained. Prior Anticoagulants: The patient has                            taken no previous anticoagulant or antiplatelet                            agents. ASA Grade Assessment: II - A patient with                            mild systemic disease. After reviewing the risks                            and benefits, the patient was deemed in  satisfactory condition to undergo the procedure.                           After obtaining informed consent, the colonoscope                            was passed under direct vision. Throughout the                            procedure, the patient's blood pressure, pulse, and                            oxygen saturations were monitored continuously. The                            Model CF-HQ190L [id number])  scope was introduced                            through the anus and advanced to the the cecum,                            identified by appendiceal orifice and ileocecal                            valve. The ileocecal valve, appendiceal orifice,                            and rectum were photographed. The quality of the                            bowel preparation was excellent. The colonoscopy                            was performed without difficulty. The patient                            tolerated the procedure well. The bowel preparation                            used was SUPREP. Scope In: 8:47:33 AM Scope Out: 9:00:24 AM Scope Withdrawal Time: 0 hours 10 minutes 28 seconds  Total Procedure Duration: 0 hours 12 minutes 51 seconds  Findings:                 Two polyps were found in the transverse colon and                            ascending colon. The polyps were 1 to 2 mm in size.                            These polyps were removed with a cold snare.                            Resection and retrieval were complete.  A few diverticula were found in the sigmoid colon.                           Internal hemorrhoids were found during retroflexion.                           The exam was otherwise without abnormality on                            direct and retroflexion views. Complications:            No immediate complications. Estimated blood loss:                            None. Estimated Blood Loss:     Estimated blood loss: none. Impression:               - Two 1 to 2 mm polyps in the transverse colon and                            in the ascending colon, removed with a cold snare.                            Resected and retrieved.                           - Diverticulosis in the sigmoid colon.                           - Internal hemorrhoids.                           - The examination was otherwise normal on direct                            and  retroflexion views. Recommendation:           - Repeat colonoscopy is not recommended for                            screening purposes.                           - Patient has a contact number available for                            emergencies. The signs and symptoms of potential                            delayed complications were discussed with the                            patient. Return to normal activities tomorrow.                            Written discharge instructions were provided to the  patient.                           - Resume previous diet.                           - Continue present medications.                           - Await pathology results. Docia Chuck. Henrene Pastor, MD 12/01/2015 9:05:05 AM This report has been signed electronically.

## 2015-12-01 NOTE — Patient Instructions (Signed)

## 2015-12-04 ENCOUNTER — Telehealth: Payer: Self-pay

## 2015-12-04 NOTE — Telephone Encounter (Signed)
  Follow up Call-  Call back number 12/01/2015  Post procedure Call Back phone  # [phone]  Permission to leave phone message Yes  Some recent data might be hidden     Patient questions:  Do you have a fever, pain , or abdominal swelling? No. Pain Score  0 *  Have you tolerated food without any problems? Yes.    Have you been able to return to your normal activities? Yes.    Do you have any questions about your discharge instructions: Diet   No. Medications  No. Follow up visit  No.  Do you have questions or concerns about your Care? No.  Actions: * If pain score is 4 or above: No action needed, pain <4.

## 2015-12-12 ENCOUNTER — Encounter: Payer: Self-pay | Admitting: Internal Medicine

## 2015-12-25 DIAGNOSIS — I10 Essential (primary) hypertension: Secondary | ICD-10-CM | POA: Diagnosis not present

## 2015-12-25 DIAGNOSIS — E785 Hyperlipidemia, unspecified: Secondary | ICD-10-CM | POA: Diagnosis not present

## 2015-12-25 DIAGNOSIS — Z6831 Body mass index (BMI) 31.0-31.9, adult: Secondary | ICD-10-CM | POA: Diagnosis not present

## 2016-02-16 DIAGNOSIS — H401222 Low-tension glaucoma, left eye, moderate stage: Secondary | ICD-10-CM | POA: Diagnosis not present

## 2016-02-16 DIAGNOSIS — H401211 Low-tension glaucoma, right eye, mild stage: Secondary | ICD-10-CM | POA: Diagnosis not present

## 2016-02-16 DIAGNOSIS — H25013 Cortical age-related cataract, bilateral: Secondary | ICD-10-CM | POA: Diagnosis not present

## 2016-02-16 DIAGNOSIS — H35033 Hypertensive retinopathy, bilateral: Secondary | ICD-10-CM | POA: Diagnosis not present

## 2016-02-28 DIAGNOSIS — D72819 Decreased white blood cell count, unspecified: Secondary | ICD-10-CM | POA: Diagnosis not present

## 2016-02-28 DIAGNOSIS — M0570 Rheumatoid arthritis with rheumatoid factor of unspecified site without organ or systems involvement: Secondary | ICD-10-CM | POA: Diagnosis not present

## 2016-02-28 DIAGNOSIS — Z79899 Other long term (current) drug therapy: Secondary | ICD-10-CM | POA: Diagnosis not present

## 2016-03-04 DIAGNOSIS — H15102 Unspecified episcleritis, left eye: Secondary | ICD-10-CM | POA: Diagnosis not present

## 2016-03-04 DIAGNOSIS — M545 Low back pain: Secondary | ICD-10-CM | POA: Diagnosis not present

## 2016-03-04 DIAGNOSIS — M0570 Rheumatoid arthritis with rheumatoid factor of unspecified site without organ or systems involvement: Secondary | ICD-10-CM | POA: Diagnosis not present

## 2016-03-04 DIAGNOSIS — M5416 Radiculopathy, lumbar region: Secondary | ICD-10-CM | POA: Diagnosis not present

## 2016-03-06 DIAGNOSIS — M545 Low back pain: Secondary | ICD-10-CM | POA: Diagnosis not present

## 2016-03-12 DIAGNOSIS — M545 Low back pain: Secondary | ICD-10-CM | POA: Diagnosis not present

## 2016-03-14 DIAGNOSIS — M545 Low back pain: Secondary | ICD-10-CM | POA: Diagnosis not present

## 2016-03-18 DIAGNOSIS — M545 Low back pain: Secondary | ICD-10-CM | POA: Diagnosis not present

## 2016-03-20 DIAGNOSIS — M545 Low back pain: Secondary | ICD-10-CM | POA: Diagnosis not present

## 2016-03-25 DIAGNOSIS — M0559 Rheumatoid polyneuropathy with rheumatoid arthritis of multiple sites: Secondary | ICD-10-CM | POA: Diagnosis not present

## 2016-03-25 DIAGNOSIS — E785 Hyperlipidemia, unspecified: Secondary | ICD-10-CM | POA: Diagnosis not present

## 2016-03-25 DIAGNOSIS — I10 Essential (primary) hypertension: Secondary | ICD-10-CM | POA: Diagnosis not present

## 2016-04-08 ENCOUNTER — Other Ambulatory Visit: Payer: Self-pay | Admitting: Internal Medicine

## 2016-05-02 DIAGNOSIS — Z79899 Other long term (current) drug therapy: Secondary | ICD-10-CM | POA: Diagnosis not present

## 2016-05-02 DIAGNOSIS — M79641 Pain in right hand: Secondary | ICD-10-CM | POA: Diagnosis not present

## 2016-05-02 DIAGNOSIS — M0570 Rheumatoid arthritis with rheumatoid factor of unspecified site without organ or systems involvement: Secondary | ICD-10-CM | POA: Diagnosis not present

## 2016-05-02 DIAGNOSIS — D72819 Decreased white blood cell count, unspecified: Secondary | ICD-10-CM | POA: Diagnosis not present

## 2016-05-02 DIAGNOSIS — M542 Cervicalgia: Secondary | ICD-10-CM | POA: Diagnosis not present

## 2016-05-02 DIAGNOSIS — M79642 Pain in left hand: Secondary | ICD-10-CM | POA: Diagnosis not present

## 2016-05-02 DIAGNOSIS — M79602 Pain in left arm: Secondary | ICD-10-CM | POA: Diagnosis not present

## 2016-06-03 DIAGNOSIS — Z79899 Other long term (current) drug therapy: Secondary | ICD-10-CM | POA: Diagnosis not present

## 2016-06-03 DIAGNOSIS — M0570 Rheumatoid arthritis with rheumatoid factor of unspecified site without organ or systems involvement: Secondary | ICD-10-CM | POA: Diagnosis not present

## 2016-06-03 DIAGNOSIS — D72819 Decreased white blood cell count, unspecified: Secondary | ICD-10-CM | POA: Diagnosis not present

## 2016-07-24 DIAGNOSIS — E785 Hyperlipidemia, unspecified: Secondary | ICD-10-CM | POA: Diagnosis not present

## 2016-07-24 DIAGNOSIS — M0559 Rheumatoid polyneuropathy with rheumatoid arthritis of multiple sites: Secondary | ICD-10-CM | POA: Diagnosis not present

## 2016-07-24 DIAGNOSIS — I10 Essential (primary) hypertension: Secondary | ICD-10-CM | POA: Diagnosis not present

## 2016-07-25 DIAGNOSIS — M0559 Rheumatoid polyneuropathy with rheumatoid arthritis of multiple sites: Secondary | ICD-10-CM | POA: Diagnosis not present

## 2016-07-25 DIAGNOSIS — E785 Hyperlipidemia, unspecified: Secondary | ICD-10-CM | POA: Diagnosis not present

## 2016-07-25 DIAGNOSIS — I10 Essential (primary) hypertension: Secondary | ICD-10-CM | POA: Diagnosis not present

## 2016-08-15 DIAGNOSIS — M25512 Pain in left shoulder: Secondary | ICD-10-CM | POA: Diagnosis not present

## 2016-08-15 DIAGNOSIS — M0570 Rheumatoid arthritis with rheumatoid factor of unspecified site without organ or systems involvement: Secondary | ICD-10-CM | POA: Diagnosis not present

## 2016-08-15 DIAGNOSIS — M542 Cervicalgia: Secondary | ICD-10-CM | POA: Diagnosis not present

## 2016-08-23 DIAGNOSIS — H401222 Low-tension glaucoma, left eye, moderate stage: Secondary | ICD-10-CM | POA: Diagnosis not present

## 2016-08-23 DIAGNOSIS — H401211 Low-tension glaucoma, right eye, mild stage: Secondary | ICD-10-CM | POA: Diagnosis not present

## 2016-10-29 ENCOUNTER — Other Ambulatory Visit: Payer: Self-pay | Admitting: Internal Medicine

## 2016-11-20 DIAGNOSIS — M0559 Rheumatoid polyneuropathy with rheumatoid arthritis of multiple sites: Secondary | ICD-10-CM | POA: Diagnosis not present

## 2016-11-20 DIAGNOSIS — R7309 Other abnormal glucose: Secondary | ICD-10-CM | POA: Diagnosis not present

## 2016-11-20 DIAGNOSIS — I10 Essential (primary) hypertension: Secondary | ICD-10-CM | POA: Diagnosis not present

## 2016-11-20 DIAGNOSIS — E785 Hyperlipidemia, unspecified: Secondary | ICD-10-CM | POA: Diagnosis not present

## 2016-11-20 DIAGNOSIS — M2011 Hallux valgus (acquired), right foot: Secondary | ICD-10-CM | POA: Diagnosis not present

## 2016-11-25 DIAGNOSIS — M2011 Hallux valgus (acquired), right foot: Secondary | ICD-10-CM | POA: Diagnosis not present

## 2016-11-25 DIAGNOSIS — I10 Essential (primary) hypertension: Secondary | ICD-10-CM | POA: Diagnosis not present

## 2016-11-25 DIAGNOSIS — E782 Mixed hyperlipidemia: Secondary | ICD-10-CM | POA: Diagnosis not present

## 2016-12-05 DIAGNOSIS — Z6832 Body mass index (BMI) 32.0-32.9, adult: Secondary | ICD-10-CM | POA: Diagnosis not present

## 2016-12-05 DIAGNOSIS — E669 Obesity, unspecified: Secondary | ICD-10-CM | POA: Diagnosis not present

## 2016-12-05 DIAGNOSIS — M0579 Rheumatoid arthritis with rheumatoid factor of multiple sites without organ or systems involvement: Secondary | ICD-10-CM | POA: Diagnosis not present

## 2017-01-01 ENCOUNTER — Other Ambulatory Visit: Payer: Self-pay | Admitting: Internal Medicine

## 2017-01-03 ENCOUNTER — Telehealth: Payer: Self-pay | Admitting: Internal Medicine

## 2017-01-06 MED ORDER — OMEPRAZOLE 20 MG PO CPDR: 20.0000 mg | DELAYED_RELEASE_CAPSULE | Freq: Every day | ORAL | 1 refills | Status: DC

## 2017-01-06 NOTE — Telephone Encounter (Signed)
Omeprazole sent to pharmacy.

## 2017-01-06 NOTE — Telephone Encounter (Signed)
Appointment has been scheduled. Patient is requesting omeprazole refill to be sent to [facility] on [address].

## 2017-02-12 DIAGNOSIS — Z0000 Encounter for general adult medical examination without abnormal findings: Secondary | ICD-10-CM | POA: Diagnosis not present

## 2017-02-21 DIAGNOSIS — H401211 Low-tension glaucoma, right eye, mild stage: Secondary | ICD-10-CM | POA: Diagnosis not present

## 2017-02-21 DIAGNOSIS — H401222 Low-tension glaucoma, left eye, moderate stage: Secondary | ICD-10-CM | POA: Diagnosis not present

## 2017-02-21 DIAGNOSIS — H25013 Cortical age-related cataract, bilateral: Secondary | ICD-10-CM | POA: Diagnosis not present

## 2017-02-21 DIAGNOSIS — H35033 Hypertensive retinopathy, bilateral: Secondary | ICD-10-CM | POA: Diagnosis not present

## 2017-02-25 DIAGNOSIS — E669 Obesity, unspecified: Secondary | ICD-10-CM | POA: Diagnosis not present

## 2017-02-25 DIAGNOSIS — M7989 Other specified soft tissue disorders: Secondary | ICD-10-CM | POA: Diagnosis not present

## 2017-02-25 DIAGNOSIS — M0579 Rheumatoid arthritis with rheumatoid factor of multiple sites without organ or systems involvement: Secondary | ICD-10-CM | POA: Diagnosis not present

## 2017-02-25 DIAGNOSIS — Z6832 Body mass index (BMI) 32.0-32.9, adult: Secondary | ICD-10-CM | POA: Diagnosis not present

## 2017-03-03 ENCOUNTER — Ambulatory Visit: Payer: Medicare PPO | Admitting: Internal Medicine

## 2017-03-03 ENCOUNTER — Encounter: Payer: Self-pay | Admitting: Internal Medicine

## 2017-03-03 VITALS — BP 130/68 | HR 66 | Ht 66.5 in | Wt 218.4 lb

## 2017-03-03 DIAGNOSIS — K219 Gastro-esophageal reflux disease without esophagitis: Secondary | ICD-10-CM

## 2017-03-03 DIAGNOSIS — K222 Esophageal obstruction: Secondary | ICD-10-CM | POA: Diagnosis not present

## 2017-03-03 MED ORDER — OMEPRAZOLE 20 MG PO CPDR: 20.0000 mg | DELAYED_RELEASE_CAPSULE | Freq: Every day | ORAL | 3 refills | Status: DC

## 2017-03-03 NOTE — Progress Notes (Signed)
HISTORY OF PRESENT ILLNESS:  [name] is a 76 y.o. male with hypertension, rheumatoid arthritis, and GERD complicated by peptic stricture for which he is undergone prior esophageal dilation in August 2007. He also has a history of adenomatous colon polyps. The patient presents today for routine follow-up regarding management of his chronic GERD. He was last seen in this office December 2014. [facility] for surveillance colonoscopy. He was found to have diminutive colon polyps which were removed and sigmoid diverticulosis as well as internal hemorrhoids. No routine follow-up surveillance recommended given his age and favorable findings. [name] tells me that he has been doing well with regards to his reflux. He continues on omeprazole 20 mg daily. On medication no reflux symptoms. Off medication he will experience breakthrough. He has had no issues with recurrent dysphagia. No appreciable medication side effects. His chronic medical problems are stable. He does request medication refill  REVIEW OF SYSTEMS:  All non-GI ROS negative unless otherwise stated in the history of present illness except for arthritis, cough, ankle swelling, sinus and allergy  Past Medical History:  Diagnosis Date  . Adenomatous colon polyp   . Diverticulosis   . GERD (gastroesophageal reflux disease)   . Hemorrhoids    internal and external  . Hiatal hernia   . Hypertension   . Rheumatoid arthritis ([place])   . S/P dilatation of esophageal stricture     Past Surgical History:  Procedure Laterality Date  . CIRCUMCISION    . [place]  2002    Social History [name]  reports that  has never smoked. he has never used smokeless tobacco. He reports that he does not drink alcohol or use drugs.  family history includes Heart disease in his brother, father, and mother; Prostate cancer in his paternal uncle.  No Known Allergies     PHYSICAL  EXAMINATION: Vital signs: BP 130/68   Pulse 66   Ht 5' 6.5" (1.689 m)   Wt 218 lb 6 oz (99.1 kg)   BMI 34.72 kg/m   Constitutional: generally well-appearing, no acute distress Psychiatric: alert and oriented x3, cooperative Eyes: extraocular movements intact, anicteric, conjunctiva pink Mouth: oral pharynx moist, no lesions Neck: supple no lymphadenopathy Cardiovascular: heart regular rate and rhythm, no murmur Lungs: clear to auscultation bilaterally Abdomen: soft, obese, nontender, nondistended, no obvious ascites, no peritoneal signs, normal bowel sounds, no organomegaly Rectal: Omitted Extremities: no, clubbing, cyanosis, or lower extremity edema bilaterally Skin: no lesions on visible extremities Neuro: No focal deficits. Cranial nerves intact  ASSESSMENT:  #1. GERD complicated by peptic stricture. Asymptomatic on PPI. Status post remote dilation #2. History of diminutive adenoma. Aged out of surveillance   PLAN:  #1. Reflux precautions #2. Continue omeprazole 20 mg daily. Prescription refilled #3. Routine office follow-up one year. Sooner if needed  15 minutes spent face-to-face with the patient. Greater than 50% a time use for counseling regarding management of his chronic GERD and follow-up recommendations

## 2017-03-03 NOTE — Patient Instructions (Signed)
We have sent the following medications to your pharmacy for you to pick up at your convenience:  Prilosec  Please follow up in one year  

## 2017-03-10 ENCOUNTER — Other Ambulatory Visit: Payer: Self-pay | Admitting: Pharmacist

## 2017-03-10 NOTE — Patient Outreach (Signed)
Incoming call from Wynelle Fanny in response to the [facility] Medication Adherence Campaign. Speak with patient. HIPAA identifiers verified and verbal consent received.  Mr. Tursi reports that he has been taking his amlodipine and valsartan-hydrochlorothiazide each once daily as directed. Denies any missed doses or barrier to taking his medication. Reports that about a year ago, Dr. Criss Rosales decreased his valsartan-hydrochlorothiazide dose from 1 tablet daily to 1/2 tablet daily. However, patient reports that Dr. Criss Rosales did not change his prescription so the bottle continues to reflect the full tablet once daily dosing. Note that a lower dose of this combination medication does not exist on the market. Let Mr. Cuffe know that I will call Dr. Fransico Setters office to request that she update the prescription to reflect his current 1/2 tablet daily dosing.  PLAN  Will call Dr. Fransico Setters office to verify this dose/directions and, if correct, request that she update the patient's valsartan-hydrochlorothiazide 80-12.5 mg prescription to reflect his current 1/2 tablet daily dosing.  Harlow Asa, PharmD, [place] [facility] [phone]

## 2017-03-10 NOTE — Patient Outreach (Signed)
Call Dr. Fransico Setters office to verify patient's dose/directions for his valsartan-hydrochlorothiazide and, if appropriate, request that Dr. Criss Rosales update the patient's valsartan-hydrochlorothiazide 80-12.5 mg prescription to reflect his current 1/2 tablet daily dosing. Speak with Ubaldo Glassing in the office. Ubaldo Glassing reports that Dr. Criss Rosales did change the patient's dose to 1/2 tablet daily about a year ago. Reports that she is unable to update the prescription with the pharmacy today as Dr. Criss Rosales is not in the clinic today. However, reports that she will follow up with Dr. Criss Rosales tomorrow regarding updating this dosing with the pharmacy and then also call me back at that time. If have not heard back from the [facility] by 03/12/17, will follow up again at that time.  Harlow Asa, PharmD, [place] [facility] [phone]

## 2017-03-12 ENCOUNTER — Other Ambulatory Visit: Payer: Self-pay | Admitting: Pharmacist

## 2017-03-12 NOTE — Patient Outreach (Signed)
Call again to Dr. Fransico Setters office to request that Dr. Criss Rosales update the patient's valsartan-hydrochlorothiazide 80-12.5 mg prescription to reflect his current 1/2 tablet daily dosing. Ubaldo Glassing reports that she did confirm the dosing with Dr. Criss Rosales and that she will send a prescription with the correct dosing over the [facility]'s [facility] today.  Will close pharmacy episode at this time.  Harlow Asa, PharmD, [place] [facility] [phone]

## 2017-03-31 DIAGNOSIS — I10 Essential (primary) hypertension: Secondary | ICD-10-CM | POA: Diagnosis not present

## 2017-03-31 DIAGNOSIS — E118 Type 2 diabetes mellitus with unspecified complications: Secondary | ICD-10-CM | POA: Diagnosis not present

## 2017-03-31 DIAGNOSIS — Z0000 Encounter for general adult medical examination without abnormal findings: Secondary | ICD-10-CM | POA: Diagnosis not present

## 2017-03-31 DIAGNOSIS — Z125 Encounter for screening for malignant neoplasm of prostate: Secondary | ICD-10-CM | POA: Diagnosis not present

## 2017-03-31 DIAGNOSIS — M2011 Hallux valgus (acquired), right foot: Secondary | ICD-10-CM | POA: Diagnosis not present

## 2017-03-31 DIAGNOSIS — E782 Mixed hyperlipidemia: Secondary | ICD-10-CM | POA: Diagnosis not present

## 2017-04-17 DIAGNOSIS — E785 Hyperlipidemia, unspecified: Secondary | ICD-10-CM | POA: Diagnosis not present

## 2017-04-17 DIAGNOSIS — J682 Upper respiratory inflammation due to chemicals, gases, fumes and vapors, not elsewhere classified: Secondary | ICD-10-CM | POA: Diagnosis not present

## 2017-04-17 DIAGNOSIS — I10 Essential (primary) hypertension: Secondary | ICD-10-CM | POA: Diagnosis not present

## 2017-05-28 DIAGNOSIS — M7989 Other specified soft tissue disorders: Secondary | ICD-10-CM | POA: Diagnosis not present

## 2017-05-28 DIAGNOSIS — E669 Obesity, unspecified: Secondary | ICD-10-CM | POA: Diagnosis not present

## 2017-05-28 DIAGNOSIS — Z6832 Body mass index (BMI) 32.0-32.9, adult: Secondary | ICD-10-CM | POA: Diagnosis not present

## 2017-05-28 DIAGNOSIS — M0579 Rheumatoid arthritis with rheumatoid factor of multiple sites without organ or systems involvement: Secondary | ICD-10-CM | POA: Diagnosis not present

## 2017-06-07 DIAGNOSIS — J398 Other specified diseases of upper respiratory tract: Secondary | ICD-10-CM | POA: Diagnosis not present

## 2017-08-14 DIAGNOSIS — E782 Mixed hyperlipidemia: Secondary | ICD-10-CM | POA: Diagnosis not present

## 2017-08-14 DIAGNOSIS — E118 Type 2 diabetes mellitus with unspecified complications: Secondary | ICD-10-CM | POA: Diagnosis not present

## 2017-08-14 DIAGNOSIS — I10 Essential (primary) hypertension: Secondary | ICD-10-CM | POA: Diagnosis not present

## 2017-08-14 DIAGNOSIS — Z125 Encounter for screening for malignant neoplasm of prostate: Secondary | ICD-10-CM | POA: Diagnosis not present

## 2017-08-14 DIAGNOSIS — M0559 Rheumatoid polyneuropathy with rheumatoid arthritis of multiple sites: Secondary | ICD-10-CM | POA: Diagnosis not present

## 2017-08-18 DIAGNOSIS — J301 Allergic rhinitis due to pollen: Secondary | ICD-10-CM | POA: Diagnosis not present

## 2017-08-18 DIAGNOSIS — M0559 Rheumatoid polyneuropathy with rheumatoid arthritis of multiple sites: Secondary | ICD-10-CM | POA: Diagnosis not present

## 2017-08-18 DIAGNOSIS — E785 Hyperlipidemia, unspecified: Secondary | ICD-10-CM | POA: Diagnosis not present

## 2017-08-18 DIAGNOSIS — J398 Other specified diseases of upper respiratory tract: Secondary | ICD-10-CM | POA: Diagnosis not present

## 2017-08-18 DIAGNOSIS — I10 Essential (primary) hypertension: Secondary | ICD-10-CM | POA: Diagnosis not present

## 2017-08-22 DIAGNOSIS — H401222 Low-tension glaucoma, left eye, moderate stage: Secondary | ICD-10-CM | POA: Diagnosis not present

## 2017-08-22 DIAGNOSIS — H401211 Low-tension glaucoma, right eye, mild stage: Secondary | ICD-10-CM | POA: Diagnosis not present

## 2017-08-22 DIAGNOSIS — H2513 Age-related nuclear cataract, bilateral: Secondary | ICD-10-CM | POA: Diagnosis not present

## 2017-08-25 DIAGNOSIS — Z6832 Body mass index (BMI) 32.0-32.9, adult: Secondary | ICD-10-CM | POA: Diagnosis not present

## 2017-08-25 DIAGNOSIS — E669 Obesity, unspecified: Secondary | ICD-10-CM | POA: Diagnosis not present

## 2017-08-25 DIAGNOSIS — M7989 Other specified soft tissue disorders: Secondary | ICD-10-CM | POA: Diagnosis not present

## 2017-08-25 DIAGNOSIS — M0579 Rheumatoid arthritis with rheumatoid factor of multiple sites without organ or systems involvement: Secondary | ICD-10-CM | POA: Diagnosis not present

## 2017-11-25 DIAGNOSIS — M0579 Rheumatoid arthritis with rheumatoid factor of multiple sites without organ or systems involvement: Secondary | ICD-10-CM | POA: Diagnosis not present

## 2017-11-25 DIAGNOSIS — Z6831 Body mass index (BMI) 31.0-31.9, adult: Secondary | ICD-10-CM | POA: Diagnosis not present

## 2017-11-25 DIAGNOSIS — M7989 Other specified soft tissue disorders: Secondary | ICD-10-CM | POA: Diagnosis not present

## 2017-11-25 DIAGNOSIS — E669 Obesity, unspecified: Secondary | ICD-10-CM | POA: Diagnosis not present

## 2017-12-02 DIAGNOSIS — H2512 Age-related nuclear cataract, left eye: Secondary | ICD-10-CM | POA: Diagnosis not present

## 2017-12-02 DIAGNOSIS — H25013 Cortical age-related cataract, bilateral: Secondary | ICD-10-CM | POA: Diagnosis not present

## 2017-12-02 DIAGNOSIS — H401211 Low-tension glaucoma, right eye, mild stage: Secondary | ICD-10-CM | POA: Diagnosis not present

## 2017-12-02 DIAGNOSIS — H2513 Age-related nuclear cataract, bilateral: Secondary | ICD-10-CM | POA: Diagnosis not present

## 2017-12-02 DIAGNOSIS — H401222 Low-tension glaucoma, left eye, moderate stage: Secondary | ICD-10-CM | POA: Diagnosis not present

## 2017-12-10 DIAGNOSIS — H2512 Age-related nuclear cataract, left eye: Secondary | ICD-10-CM | POA: Diagnosis not present

## 2017-12-10 DIAGNOSIS — H401222 Low-tension glaucoma, left eye, moderate stage: Secondary | ICD-10-CM | POA: Diagnosis not present

## 2017-12-10 DIAGNOSIS — H25812 Combined forms of age-related cataract, left eye: Secondary | ICD-10-CM | POA: Diagnosis not present

## 2017-12-15 DIAGNOSIS — H25011 Cortical age-related cataract, right eye: Secondary | ICD-10-CM | POA: Diagnosis not present

## 2017-12-15 DIAGNOSIS — H2511 Age-related nuclear cataract, right eye: Secondary | ICD-10-CM | POA: Diagnosis not present

## 2017-12-16 DIAGNOSIS — E785 Hyperlipidemia, unspecified: Secondary | ICD-10-CM | POA: Diagnosis not present

## 2017-12-16 DIAGNOSIS — I10 Essential (primary) hypertension: Secondary | ICD-10-CM | POA: Diagnosis not present

## 2017-12-16 DIAGNOSIS — J301 Allergic rhinitis due to pollen: Secondary | ICD-10-CM | POA: Diagnosis not present

## 2017-12-16 DIAGNOSIS — R7309 Other abnormal glucose: Secondary | ICD-10-CM | POA: Diagnosis not present

## 2017-12-18 DIAGNOSIS — Z6830 Body mass index (BMI) 30.0-30.9, adult: Secondary | ICD-10-CM | POA: Diagnosis not present

## 2017-12-18 DIAGNOSIS — I10 Essential (primary) hypertension: Secondary | ICD-10-CM | POA: Diagnosis not present

## 2017-12-18 DIAGNOSIS — E782 Mixed hyperlipidemia: Secondary | ICD-10-CM | POA: Diagnosis not present

## 2017-12-31 DIAGNOSIS — H401111 Primary open-angle glaucoma, right eye, mild stage: Secondary | ICD-10-CM | POA: Diagnosis not present

## 2017-12-31 DIAGNOSIS — H25811 Combined forms of age-related cataract, right eye: Secondary | ICD-10-CM | POA: Diagnosis not present

## 2017-12-31 DIAGNOSIS — H2511 Age-related nuclear cataract, right eye: Secondary | ICD-10-CM | POA: Diagnosis not present

## 2017-12-31 DIAGNOSIS — H401211 Low-tension glaucoma, right eye, mild stage: Secondary | ICD-10-CM | POA: Diagnosis not present

## 2018-01-12 DIAGNOSIS — I10 Essential (primary) hypertension: Secondary | ICD-10-CM | POA: Diagnosis not present

## 2018-01-12 DIAGNOSIS — E782 Mixed hyperlipidemia: Secondary | ICD-10-CM | POA: Diagnosis not present

## 2018-02-25 DIAGNOSIS — M0579 Rheumatoid arthritis with rheumatoid factor of multiple sites without organ or systems involvement: Secondary | ICD-10-CM | POA: Diagnosis not present

## 2018-04-08 ENCOUNTER — Other Ambulatory Visit: Payer: Self-pay | Admitting: Internal Medicine

## 2018-04-29 DIAGNOSIS — J9819 Other pulmonary collapse: Secondary | ICD-10-CM

## 2018-04-29 HISTORY — DX: Other pulmonary collapse: J98.19

## 2018-05-28 DIAGNOSIS — M0579 Rheumatoid arthritis with rheumatoid factor of multiple sites without organ or systems involvement: Secondary | ICD-10-CM | POA: Diagnosis not present

## 2018-05-28 DIAGNOSIS — E669 Obesity, unspecified: Secondary | ICD-10-CM | POA: Diagnosis not present

## 2018-05-28 DIAGNOSIS — M7989 Other specified soft tissue disorders: Secondary | ICD-10-CM | POA: Diagnosis not present

## 2018-05-28 DIAGNOSIS — Z6830 Body mass index (BMI) 30.0-30.9, adult: Secondary | ICD-10-CM | POA: Diagnosis not present

## 2018-07-09 DIAGNOSIS — J398 Other specified diseases of upper respiratory tract: Secondary | ICD-10-CM | POA: Diagnosis not present

## 2018-07-09 DIAGNOSIS — E782 Mixed hyperlipidemia: Secondary | ICD-10-CM | POA: Diagnosis not present

## 2018-07-09 DIAGNOSIS — M0559 Rheumatoid polyneuropathy with rheumatoid arthritis of multiple sites: Secondary | ICD-10-CM | POA: Diagnosis not present

## 2018-07-09 DIAGNOSIS — I10 Essential (primary) hypertension: Secondary | ICD-10-CM | POA: Diagnosis not present

## 2018-07-09 DIAGNOSIS — M2011 Hallux valgus (acquired), right foot: Secondary | ICD-10-CM | POA: Diagnosis not present

## 2018-07-09 DIAGNOSIS — E118 Type 2 diabetes mellitus with unspecified complications: Secondary | ICD-10-CM | POA: Diagnosis not present

## 2018-07-13 DIAGNOSIS — M0609 Rheumatoid arthritis without rheumatoid factor, multiple sites: Secondary | ICD-10-CM | POA: Diagnosis not present

## 2018-07-13 DIAGNOSIS — I10 Essential (primary) hypertension: Secondary | ICD-10-CM | POA: Diagnosis not present

## 2018-07-13 DIAGNOSIS — M25511 Pain in right shoulder: Secondary | ICD-10-CM | POA: Diagnosis not present

## 2018-07-13 DIAGNOSIS — E785 Hyperlipidemia, unspecified: Secondary | ICD-10-CM | POA: Diagnosis not present

## 2018-07-14 DIAGNOSIS — M25511 Pain in right shoulder: Secondary | ICD-10-CM | POA: Diagnosis not present

## 2018-07-14 DIAGNOSIS — M6281 Muscle weakness (generalized): Secondary | ICD-10-CM | POA: Diagnosis not present

## 2018-07-14 DIAGNOSIS — M542 Cervicalgia: Secondary | ICD-10-CM | POA: Diagnosis not present

## 2018-07-15 DIAGNOSIS — H401222 Low-tension glaucoma, left eye, moderate stage: Secondary | ICD-10-CM | POA: Diagnosis not present

## 2018-07-15 DIAGNOSIS — Z961 Presence of intraocular lens: Secondary | ICD-10-CM | POA: Diagnosis not present

## 2018-07-15 DIAGNOSIS — H401211 Low-tension glaucoma, right eye, mild stage: Secondary | ICD-10-CM | POA: Diagnosis not present

## 2018-07-15 DIAGNOSIS — H35033 Hypertensive retinopathy, bilateral: Secondary | ICD-10-CM | POA: Diagnosis not present

## 2018-07-16 DIAGNOSIS — M6281 Muscle weakness (generalized): Secondary | ICD-10-CM | POA: Diagnosis not present

## 2018-07-16 DIAGNOSIS — M542 Cervicalgia: Secondary | ICD-10-CM | POA: Diagnosis not present

## 2018-07-16 DIAGNOSIS — M25511 Pain in right shoulder: Secondary | ICD-10-CM | POA: Diagnosis not present

## 2018-07-27 ENCOUNTER — Telehealth: Payer: Self-pay | Admitting: Internal Medicine

## 2018-07-27 MED ORDER — OMEPRAZOLE 20 MG PO CPDR: 20.0000 mg | DELAYED_RELEASE_CAPSULE | Freq: Every day | ORAL | 0 refills | Status: DC

## 2018-07-27 NOTE — Telephone Encounter (Signed)
Pt needs rf for [facility]ep[facility] to [facility] on [address]

## 2018-07-27 NOTE — Telephone Encounter (Signed)
Informed patient that I sent the prescription to the pharmacy.

## 2018-08-11 DIAGNOSIS — M25511 Pain in right shoulder: Secondary | ICD-10-CM | POA: Diagnosis not present

## 2018-08-11 DIAGNOSIS — J301 Allergic rhinitis due to pollen: Secondary | ICD-10-CM | POA: Diagnosis not present

## 2018-08-11 DIAGNOSIS — E782 Mixed hyperlipidemia: Secondary | ICD-10-CM | POA: Diagnosis not present

## 2018-08-11 DIAGNOSIS — I10 Essential (primary) hypertension: Secondary | ICD-10-CM | POA: Diagnosis not present

## 2018-08-14 DIAGNOSIS — M6281 Muscle weakness (generalized): Secondary | ICD-10-CM | POA: Diagnosis not present

## 2018-08-14 DIAGNOSIS — M25511 Pain in right shoulder: Secondary | ICD-10-CM | POA: Diagnosis not present

## 2018-08-14 DIAGNOSIS — M542 Cervicalgia: Secondary | ICD-10-CM | POA: Diagnosis not present

## 2018-08-17 DIAGNOSIS — M6281 Muscle weakness (generalized): Secondary | ICD-10-CM | POA: Diagnosis not present

## 2018-08-17 DIAGNOSIS — M542 Cervicalgia: Secondary | ICD-10-CM | POA: Diagnosis not present

## 2018-08-17 DIAGNOSIS — M25511 Pain in right shoulder: Secondary | ICD-10-CM | POA: Diagnosis not present

## 2018-08-19 DIAGNOSIS — M6281 Muscle weakness (generalized): Secondary | ICD-10-CM | POA: Diagnosis not present

## 2018-08-19 DIAGNOSIS — M542 Cervicalgia: Secondary | ICD-10-CM | POA: Diagnosis not present

## 2018-08-19 DIAGNOSIS — M25511 Pain in right shoulder: Secondary | ICD-10-CM | POA: Diagnosis not present

## 2018-08-24 DIAGNOSIS — M6281 Muscle weakness (generalized): Secondary | ICD-10-CM | POA: Diagnosis not present

## 2018-08-24 DIAGNOSIS — M542 Cervicalgia: Secondary | ICD-10-CM | POA: Diagnosis not present

## 2018-08-24 DIAGNOSIS — M25511 Pain in right shoulder: Secondary | ICD-10-CM | POA: Diagnosis not present

## 2018-08-26 DIAGNOSIS — M0579 Rheumatoid arthritis with rheumatoid factor of multiple sites without organ or systems involvement: Secondary | ICD-10-CM | POA: Diagnosis not present

## 2018-08-26 DIAGNOSIS — M25511 Pain in right shoulder: Secondary | ICD-10-CM | POA: Diagnosis not present

## 2018-08-26 DIAGNOSIS — M6281 Muscle weakness (generalized): Secondary | ICD-10-CM | POA: Diagnosis not present

## 2018-08-26 DIAGNOSIS — M542 Cervicalgia: Secondary | ICD-10-CM | POA: Diagnosis not present

## 2018-08-31 DIAGNOSIS — M6281 Muscle weakness (generalized): Secondary | ICD-10-CM | POA: Diagnosis not present

## 2018-08-31 DIAGNOSIS — M542 Cervicalgia: Secondary | ICD-10-CM | POA: Diagnosis not present

## 2018-08-31 DIAGNOSIS — M25511 Pain in right shoulder: Secondary | ICD-10-CM | POA: Diagnosis not present

## 2018-09-02 DIAGNOSIS — M542 Cervicalgia: Secondary | ICD-10-CM | POA: Diagnosis not present

## 2018-09-02 DIAGNOSIS — M6281 Muscle weakness (generalized): Secondary | ICD-10-CM | POA: Diagnosis not present

## 2018-09-02 DIAGNOSIS — M545 Low back pain: Secondary | ICD-10-CM | POA: Diagnosis not present

## 2018-09-02 DIAGNOSIS — M25552 Pain in left hip: Secondary | ICD-10-CM | POA: Diagnosis not present

## 2018-09-02 DIAGNOSIS — M25511 Pain in right shoulder: Secondary | ICD-10-CM | POA: Diagnosis not present

## 2018-09-07 DIAGNOSIS — M25511 Pain in right shoulder: Secondary | ICD-10-CM | POA: Diagnosis not present

## 2018-09-07 DIAGNOSIS — M542 Cervicalgia: Secondary | ICD-10-CM | POA: Diagnosis not present

## 2018-09-07 DIAGNOSIS — M6281 Muscle weakness (generalized): Secondary | ICD-10-CM | POA: Diagnosis not present

## 2018-09-09 DIAGNOSIS — M542 Cervicalgia: Secondary | ICD-10-CM | POA: Diagnosis not present

## 2018-09-09 DIAGNOSIS — M25511 Pain in right shoulder: Secondary | ICD-10-CM | POA: Diagnosis not present

## 2018-09-09 DIAGNOSIS — M6281 Muscle weakness (generalized): Secondary | ICD-10-CM | POA: Diagnosis not present

## 2018-09-14 DIAGNOSIS — M6281 Muscle weakness (generalized): Secondary | ICD-10-CM | POA: Diagnosis not present

## 2018-09-14 DIAGNOSIS — M25511 Pain in right shoulder: Secondary | ICD-10-CM | POA: Diagnosis not present

## 2018-09-14 DIAGNOSIS — M542 Cervicalgia: Secondary | ICD-10-CM | POA: Diagnosis not present

## 2018-09-17 DIAGNOSIS — M25511 Pain in right shoulder: Secondary | ICD-10-CM | POA: Diagnosis not present

## 2018-09-17 DIAGNOSIS — M6281 Muscle weakness (generalized): Secondary | ICD-10-CM | POA: Diagnosis not present

## 2018-09-17 DIAGNOSIS — M542 Cervicalgia: Secondary | ICD-10-CM | POA: Diagnosis not present

## 2018-09-22 DIAGNOSIS — E782 Mixed hyperlipidemia: Secondary | ICD-10-CM | POA: Diagnosis not present

## 2018-09-22 DIAGNOSIS — M6281 Muscle weakness (generalized): Secondary | ICD-10-CM | POA: Diagnosis not present

## 2018-09-22 DIAGNOSIS — J302 Other seasonal allergic rhinitis: Secondary | ICD-10-CM | POA: Diagnosis not present

## 2018-09-22 DIAGNOSIS — J301 Allergic rhinitis due to pollen: Secondary | ICD-10-CM | POA: Diagnosis not present

## 2018-09-22 DIAGNOSIS — I10 Essential (primary) hypertension: Secondary | ICD-10-CM | POA: Diagnosis not present

## 2018-09-22 DIAGNOSIS — R0602 Shortness of breath: Secondary | ICD-10-CM | POA: Diagnosis not present

## 2018-09-22 DIAGNOSIS — M0609 Rheumatoid arthritis without rheumatoid factor, multiple sites: Secondary | ICD-10-CM | POA: Diagnosis not present

## 2018-09-22 DIAGNOSIS — M542 Cervicalgia: Secondary | ICD-10-CM | POA: Diagnosis not present

## 2018-09-22 DIAGNOSIS — M25511 Pain in right shoulder: Secondary | ICD-10-CM | POA: Diagnosis not present

## 2018-09-22 DIAGNOSIS — E785 Hyperlipidemia, unspecified: Secondary | ICD-10-CM | POA: Diagnosis not present

## 2018-09-24 DIAGNOSIS — M6281 Muscle weakness (generalized): Secondary | ICD-10-CM | POA: Diagnosis not present

## 2018-09-24 DIAGNOSIS — M25511 Pain in right shoulder: Secondary | ICD-10-CM | POA: Diagnosis not present

## 2018-09-24 DIAGNOSIS — M542 Cervicalgia: Secondary | ICD-10-CM | POA: Diagnosis not present

## 2018-09-28 DIAGNOSIS — M25511 Pain in right shoulder: Secondary | ICD-10-CM | POA: Diagnosis not present

## 2018-09-28 DIAGNOSIS — M6281 Muscle weakness (generalized): Secondary | ICD-10-CM | POA: Diagnosis not present

## 2018-09-28 DIAGNOSIS — M542 Cervicalgia: Secondary | ICD-10-CM | POA: Diagnosis not present

## 2018-09-30 DIAGNOSIS — M6281 Muscle weakness (generalized): Secondary | ICD-10-CM | POA: Diagnosis not present

## 2018-09-30 DIAGNOSIS — M542 Cervicalgia: Secondary | ICD-10-CM | POA: Diagnosis not present

## 2018-09-30 DIAGNOSIS — M25511 Pain in right shoulder: Secondary | ICD-10-CM | POA: Diagnosis not present

## 2018-10-05 DIAGNOSIS — M542 Cervicalgia: Secondary | ICD-10-CM | POA: Diagnosis not present

## 2018-10-05 DIAGNOSIS — M25511 Pain in right shoulder: Secondary | ICD-10-CM | POA: Diagnosis not present

## 2018-10-05 DIAGNOSIS — M6281 Muscle weakness (generalized): Secondary | ICD-10-CM | POA: Diagnosis not present

## 2018-10-06 DIAGNOSIS — J301 Allergic rhinitis due to pollen: Secondary | ICD-10-CM | POA: Diagnosis not present

## 2018-10-06 DIAGNOSIS — M25511 Pain in right shoulder: Secondary | ICD-10-CM | POA: Diagnosis not present

## 2018-10-06 DIAGNOSIS — M0609 Rheumatoid arthritis without rheumatoid factor, multiple sites: Secondary | ICD-10-CM | POA: Diagnosis not present

## 2018-10-08 DIAGNOSIS — M6281 Muscle weakness (generalized): Secondary | ICD-10-CM | POA: Diagnosis not present

## 2018-10-08 DIAGNOSIS — M25511 Pain in right shoulder: Secondary | ICD-10-CM | POA: Diagnosis not present

## 2018-10-08 DIAGNOSIS — M542 Cervicalgia: Secondary | ICD-10-CM | POA: Diagnosis not present

## 2018-10-12 DIAGNOSIS — M6281 Muscle weakness (generalized): Secondary | ICD-10-CM | POA: Diagnosis not present

## 2018-10-12 DIAGNOSIS — M25511 Pain in right shoulder: Secondary | ICD-10-CM | POA: Diagnosis not present

## 2018-10-12 DIAGNOSIS — M542 Cervicalgia: Secondary | ICD-10-CM | POA: Diagnosis not present

## 2018-10-15 DIAGNOSIS — M25511 Pain in right shoulder: Secondary | ICD-10-CM | POA: Diagnosis not present

## 2018-10-15 DIAGNOSIS — M6281 Muscle weakness (generalized): Secondary | ICD-10-CM | POA: Diagnosis not present

## 2018-10-15 DIAGNOSIS — M542 Cervicalgia: Secondary | ICD-10-CM | POA: Diagnosis not present

## 2018-10-19 DIAGNOSIS — M6281 Muscle weakness (generalized): Secondary | ICD-10-CM | POA: Diagnosis not present

## 2018-10-19 DIAGNOSIS — M25511 Pain in right shoulder: Secondary | ICD-10-CM | POA: Diagnosis not present

## 2018-10-19 DIAGNOSIS — M542 Cervicalgia: Secondary | ICD-10-CM | POA: Diagnosis not present

## 2018-10-22 DIAGNOSIS — M25511 Pain in right shoulder: Secondary | ICD-10-CM | POA: Diagnosis not present

## 2018-10-22 DIAGNOSIS — M6281 Muscle weakness (generalized): Secondary | ICD-10-CM | POA: Diagnosis not present

## 2018-10-22 DIAGNOSIS — M542 Cervicalgia: Secondary | ICD-10-CM | POA: Diagnosis not present

## 2018-10-26 DIAGNOSIS — M542 Cervicalgia: Secondary | ICD-10-CM | POA: Diagnosis not present

## 2018-10-26 DIAGNOSIS — M25511 Pain in right shoulder: Secondary | ICD-10-CM | POA: Diagnosis not present

## 2018-10-26 DIAGNOSIS — M6281 Muscle weakness (generalized): Secondary | ICD-10-CM | POA: Diagnosis not present

## 2018-10-28 ENCOUNTER — Other Ambulatory Visit: Payer: Self-pay | Admitting: Gastroenterology

## 2018-10-29 ENCOUNTER — Other Ambulatory Visit: Payer: Self-pay

## 2018-10-29 DIAGNOSIS — M25511 Pain in right shoulder: Secondary | ICD-10-CM | POA: Diagnosis not present

## 2018-10-29 DIAGNOSIS — M542 Cervicalgia: Secondary | ICD-10-CM | POA: Diagnosis not present

## 2018-10-29 DIAGNOSIS — M6281 Muscle weakness (generalized): Secondary | ICD-10-CM | POA: Diagnosis not present

## 2018-10-29 MED ORDER — OMEPRAZOLE 20 MG PO CPDR: 20.0000 mg | DELAYED_RELEASE_CAPSULE | Freq: Every day | ORAL | 0 refills | Status: DC

## 2018-11-13 DIAGNOSIS — M25511 Pain in right shoulder: Secondary | ICD-10-CM | POA: Diagnosis not present

## 2018-11-13 DIAGNOSIS — I10 Essential (primary) hypertension: Secondary | ICD-10-CM | POA: Diagnosis not present

## 2018-11-13 DIAGNOSIS — M0609 Rheumatoid arthritis without rheumatoid factor, multiple sites: Secondary | ICD-10-CM | POA: Diagnosis not present

## 2018-11-13 DIAGNOSIS — E782 Mixed hyperlipidemia: Secondary | ICD-10-CM | POA: Diagnosis not present

## 2018-11-13 DIAGNOSIS — R7309 Other abnormal glucose: Secondary | ICD-10-CM | POA: Diagnosis not present

## 2018-11-13 DIAGNOSIS — J301 Allergic rhinitis due to pollen: Secondary | ICD-10-CM | POA: Diagnosis not present

## 2018-11-16 DIAGNOSIS — I10 Essential (primary) hypertension: Secondary | ICD-10-CM | POA: Diagnosis not present

## 2018-11-16 DIAGNOSIS — J301 Allergic rhinitis due to pollen: Secondary | ICD-10-CM | POA: Diagnosis not present

## 2018-11-16 DIAGNOSIS — E782 Mixed hyperlipidemia: Secondary | ICD-10-CM | POA: Diagnosis not present

## 2018-11-25 DIAGNOSIS — E669 Obesity, unspecified: Secondary | ICD-10-CM | POA: Diagnosis not present

## 2018-11-25 DIAGNOSIS — Z6831 Body mass index (BMI) 31.0-31.9, adult: Secondary | ICD-10-CM | POA: Diagnosis not present

## 2018-11-25 DIAGNOSIS — Z6830 Body mass index (BMI) 30.0-30.9, adult: Secondary | ICD-10-CM | POA: Diagnosis not present

## 2018-11-25 DIAGNOSIS — L03011 Cellulitis of right finger: Secondary | ICD-10-CM | POA: Diagnosis not present

## 2018-11-25 DIAGNOSIS — M7989 Other specified soft tissue disorders: Secondary | ICD-10-CM | POA: Diagnosis not present

## 2018-11-25 DIAGNOSIS — M0579 Rheumatoid arthritis with rheumatoid factor of multiple sites without organ or systems involvement: Secondary | ICD-10-CM | POA: Diagnosis not present

## 2018-11-30 DIAGNOSIS — L03019 Cellulitis of unspecified finger: Secondary | ICD-10-CM | POA: Diagnosis not present

## 2018-12-04 DIAGNOSIS — I10 Essential (primary) hypertension: Secondary | ICD-10-CM | POA: Diagnosis not present

## 2018-12-04 DIAGNOSIS — L03019 Cellulitis of unspecified finger: Secondary | ICD-10-CM | POA: Diagnosis not present

## 2019-01-21 ENCOUNTER — Other Ambulatory Visit: Payer: Self-pay | Admitting: Internal Medicine

## 2019-01-25 DIAGNOSIS — H401222 Low-tension glaucoma, left eye, moderate stage: Secondary | ICD-10-CM | POA: Diagnosis not present

## 2019-01-25 DIAGNOSIS — H04211 Epiphora due to excess lacrimation, right lacrimal gland: Secondary | ICD-10-CM | POA: Diagnosis not present

## 2019-01-25 DIAGNOSIS — H401211 Low-tension glaucoma, right eye, mild stage: Secondary | ICD-10-CM | POA: Diagnosis not present

## 2019-01-25 DIAGNOSIS — H04213 Epiphora due to excess lacrimation, bilateral lacrimal glands: Secondary | ICD-10-CM | POA: Diagnosis not present

## 2019-01-25 DIAGNOSIS — H04212 Epiphora due to excess lacrimation, left lacrimal gland: Secondary | ICD-10-CM | POA: Diagnosis not present

## 2019-02-18 ENCOUNTER — Other Ambulatory Visit: Payer: Self-pay | Admitting: Internal Medicine

## 2019-02-24 ENCOUNTER — Telehealth: Payer: Self-pay | Admitting: Internal Medicine

## 2019-02-24 NOTE — Telephone Encounter (Signed)
Pt needs rf for omeprazole [facility] to [facility] on [address].

## 2019-02-25 MED ORDER — OMEPRAZOLE 20 MG PO CPDR: 20.0000 mg | DELAYED_RELEASE_CAPSULE | Freq: Every day | ORAL | 0 refills | Status: DC

## 2019-02-25 NOTE — Telephone Encounter (Signed)
Omeprazole sent - needs office visit for further refills!!!

## 2019-03-03 DIAGNOSIS — M0579 Rheumatoid arthritis with rheumatoid factor of multiple sites without organ or systems involvement: Secondary | ICD-10-CM | POA: Diagnosis not present

## 2019-03-17 DIAGNOSIS — E782 Mixed hyperlipidemia: Secondary | ICD-10-CM | POA: Diagnosis not present

## 2019-03-17 DIAGNOSIS — J301 Allergic rhinitis due to pollen: Secondary | ICD-10-CM | POA: Diagnosis not present

## 2019-03-17 DIAGNOSIS — M25511 Pain in right shoulder: Secondary | ICD-10-CM | POA: Diagnosis not present

## 2019-03-17 DIAGNOSIS — I10 Essential (primary) hypertension: Secondary | ICD-10-CM | POA: Diagnosis not present

## 2019-03-17 DIAGNOSIS — R7309 Other abnormal glucose: Secondary | ICD-10-CM | POA: Diagnosis not present

## 2019-03-19 DIAGNOSIS — E782 Mixed hyperlipidemia: Secondary | ICD-10-CM | POA: Diagnosis not present

## 2019-03-19 DIAGNOSIS — I10 Essential (primary) hypertension: Secondary | ICD-10-CM | POA: Diagnosis not present

## 2019-03-19 DIAGNOSIS — M25511 Pain in right shoulder: Secondary | ICD-10-CM | POA: Diagnosis not present

## 2019-03-22 DIAGNOSIS — Z0000 Encounter for general adult medical examination without abnormal findings: Secondary | ICD-10-CM | POA: Diagnosis not present

## 2019-04-08 ENCOUNTER — Ambulatory Visit: Payer: Medicare PPO | Admitting: Internal Medicine

## 2019-04-08 ENCOUNTER — Other Ambulatory Visit: Payer: Self-pay

## 2019-04-08 ENCOUNTER — Encounter: Payer: Self-pay | Admitting: Internal Medicine

## 2019-04-08 VITALS — BP 120/60 | HR 72 | Temp 97.6°F | Ht 68.5 in | Wt 210.2 lb

## 2019-04-08 DIAGNOSIS — K219 Gastro-esophageal reflux disease without esophagitis: Secondary | ICD-10-CM

## 2019-04-08 MED ORDER — OMEPRAZOLE 20 MG PO CPDR: 20.0000 mg | DELAYED_RELEASE_CAPSULE | Freq: Every day | ORAL | 0 refills | Status: DC

## 2019-04-08 NOTE — Progress Notes (Signed)
HISTORY OF PRESENT ILLNESS:  [name] is a 78 y.o. male with past medical history as listed below who is followed in this office for GERD complicated by peptic stricture requiring esophageal dilation and adenomatous colon polyps.  Last evaluated March 03, 2017.  See that dictation for details.  He presents today for follow-up.  Patient reports that he has continued on omeprazole 20 mg daily.  This controls his GERD symptoms quite well.  He has not had recurrent problems with dysphagia.  His only new complaint is that of intestinal gas as manifested by bloating which has bothered him for about 2 weeks.  However, this has improved recently.  No change in bowel habits or bleeding.  His appetite and weight are good.  His last complete colonoscopy August 2017 revealed diminutive polyps and diverticulosis as well as internal hemorrhoids.  No future follow-up recommended due to his age.  Review of interval blood work and [place] reveals no relevant abnormalities  REVIEW OF SYSTEMS:  All non-GI ROS negative unless otherwise stated in the HPI except for arthritis  Past Medical History:  Diagnosis Date  . Adenomatous colon polyp   . Diverticulosis   . GERD (gastroesophageal reflux disease)   . Hemorrhoids    internal and external  . Hiatal hernia   . Hypertension   . Rheumatoid arthritis ([place])   . S/P dilatation of esophageal stricture     Past Surgical History:  Procedure Laterality Date  . CIRCUMCISION    . [place]  2002    Social History [name]  reports that he has never smoked. He has never used smokeless tobacco. He reports that he does not drink alcohol or use drugs.  family history includes Heart disease in his brother, father, and mother; Prostate cancer in his paternal uncle.  No Known Allergies     PHYSICAL EXAMINATION: Vital signs: BP 120/60 (BP Location: Left Arm, Patient Position: Sitting, Cuff Size: Normal)   Pulse 72   Temp 97.6 F (36.4 C)    Ht 5' 8.5" (1.74 m)   Wt 210 lb 4 oz (95.4 kg)   BMI 31.50 kg/m   Constitutional: generally well-appearing, no acute distress Psychiatric: alert and oriented x3, cooperative Eyes: extraocular movements intact, anicteric, conjunctiva pink Mouth: oral pharynx moist, no lesions Neck: supple no lymphadenopathy Cardiovascular: heart regular rate and rhythm, no murmur Lungs: clear to auscultation bilaterally Abdomen: soft, nontender, nondistended, no obvious ascites, no peritoneal signs, normal bowel sounds, no organomegaly Rectal: Omitted Extremities: no clubbing, cyanosis, or lower extremity edema bilaterally Skin: no lesions on visible extremities Neuro: No focal deficits.  Cranial nerves intact  ASSESSMENT:  1.  GERD complicated by peptic stricture.  The patient remains asymptomatic post dilation on PPI 2.  History of adenomatous colon polyps.  Aged out of surveillance 3.  Transient problems with bloating.  Improved  PLAN:  1.  Reflux precautions 2.  Refill omeprazole 20 mg daily.  Medication risks reviewed 3.  Discussion on intestinal gas.  Anti-gas measures reviewed. 4.  Routine office follow-up 1 year.  Sooner if needed

## 2019-04-08 NOTE — Patient Instructions (Addendum)
If you are age 79 or older, your body mass index should be between 23-30. Your Body mass index is 31.5 kg/m. If this is out of the aforementioned range listed, please consider follow up with your Primary Care Provider.  If you are age 64 or younger, your body mass index should be between 19-25. Your Body mass index is 31.5 kg/m. If this is out of the aformentioned range listed, please consider follow up with your Primary Care Provider.   Follow up in 1 year or sooner if needed.   It was a pleasure to see you today!  Dr. Perry 

## 2019-05-03 DIAGNOSIS — M0609 Rheumatoid arthritis without rheumatoid factor, multiple sites: Secondary | ICD-10-CM | POA: Diagnosis not present

## 2019-05-03 DIAGNOSIS — E785 Hyperlipidemia, unspecified: Secondary | ICD-10-CM | POA: Diagnosis not present

## 2019-05-03 DIAGNOSIS — I10 Essential (primary) hypertension: Secondary | ICD-10-CM | POA: Diagnosis not present

## 2019-05-03 DIAGNOSIS — R351 Nocturia: Secondary | ICD-10-CM | POA: Diagnosis not present

## 2019-05-27 DIAGNOSIS — E669 Obesity, unspecified: Secondary | ICD-10-CM | POA: Diagnosis not present

## 2019-05-27 DIAGNOSIS — M0579 Rheumatoid arthritis with rheumatoid factor of multiple sites without organ or systems involvement: Secondary | ICD-10-CM | POA: Diagnosis not present

## 2019-05-27 DIAGNOSIS — Z6831 Body mass index (BMI) 31.0-31.9, adult: Secondary | ICD-10-CM | POA: Diagnosis not present

## 2019-05-27 DIAGNOSIS — M7989 Other specified soft tissue disorders: Secondary | ICD-10-CM | POA: Diagnosis not present

## 2019-05-28 ENCOUNTER — Inpatient Hospital Stay (HOSPITAL_COMMUNITY): Payer: Medicare HMO

## 2019-05-28 ENCOUNTER — Other Ambulatory Visit: Payer: Self-pay

## 2019-05-28 ENCOUNTER — Emergency Department (HOSPITAL_COMMUNITY): Payer: Medicare HMO

## 2019-05-28 ENCOUNTER — Inpatient Hospital Stay (HOSPITAL_COMMUNITY)
Admission: EM | Admit: 2019-05-28 | Discharge: 2019-06-03 | DRG: 199 | Disposition: A | Discharge status: Home / Self Care | Payer: Medicare HMO | Attending: Pulmonary Disease | Admitting: Pulmonary Disease

## 2019-05-28 ENCOUNTER — Encounter (HOSPITAL_COMMUNITY): Payer: Self-pay | Admitting: Internal Medicine

## 2019-05-28 DIAGNOSIS — J438 Other emphysema: Secondary | ICD-10-CM | POA: Diagnosis not present

## 2019-05-28 DIAGNOSIS — J90 Pleural effusion, not elsewhere classified: Secondary | ICD-10-CM | POA: Diagnosis not present

## 2019-05-28 DIAGNOSIS — K219 Gastro-esophageal reflux disease without esophagitis: Secondary | ICD-10-CM | POA: Diagnosis not present

## 2019-05-28 DIAGNOSIS — J849 Interstitial pulmonary disease, unspecified: Secondary | ICD-10-CM | POA: Diagnosis not present

## 2019-05-28 DIAGNOSIS — I2510 Atherosclerotic heart disease of native coronary artery without angina pectoris: Secondary | ICD-10-CM | POA: Diagnosis not present

## 2019-05-28 DIAGNOSIS — Z8042 Family history of malignant neoplasm of prostate: Secondary | ICD-10-CM

## 2019-05-28 DIAGNOSIS — N183 Chronic kidney disease, stage 3 unspecified: Secondary | ICD-10-CM | POA: Diagnosis present

## 2019-05-28 DIAGNOSIS — I700 Atherosclerosis of aorta: Secondary | ICD-10-CM | POA: Diagnosis not present

## 2019-05-28 DIAGNOSIS — E876 Hypokalemia: Secondary | ICD-10-CM | POA: Diagnosis not present

## 2019-05-28 DIAGNOSIS — R059 Cough, unspecified: Secondary | ICD-10-CM

## 2019-05-28 DIAGNOSIS — M069 Rheumatoid arthritis, unspecified: Secondary | ICD-10-CM | POA: Diagnosis not present

## 2019-05-28 DIAGNOSIS — K5790 Diverticulosis of intestine, part unspecified, without perforation or abscess without bleeding: Secondary | ICD-10-CM | POA: Diagnosis present

## 2019-05-28 DIAGNOSIS — J9601 Acute respiratory failure with hypoxia: Secondary | ICD-10-CM | POA: Diagnosis present

## 2019-05-28 DIAGNOSIS — Z4682 Encounter for fitting and adjustment of non-vascular catheter: Secondary | ICD-10-CM | POA: Diagnosis not present

## 2019-05-28 DIAGNOSIS — K449 Diaphragmatic hernia without obstruction or gangrene: Secondary | ICD-10-CM | POA: Diagnosis present

## 2019-05-28 DIAGNOSIS — Z79899 Other long term (current) drug therapy: Secondary | ICD-10-CM | POA: Diagnosis not present

## 2019-05-28 DIAGNOSIS — I10 Essential (primary) hypertension: Secondary | ICD-10-CM | POA: Diagnosis not present

## 2019-05-28 DIAGNOSIS — R55 Syncope and collapse: Secondary | ICD-10-CM

## 2019-05-28 DIAGNOSIS — Z1211 Encounter for screening for malignant neoplasm of colon: Secondary | ICD-10-CM

## 2019-05-28 DIAGNOSIS — R0602 Shortness of breath: Secondary | ICD-10-CM | POA: Diagnosis not present

## 2019-05-28 DIAGNOSIS — J939 Pneumothorax, unspecified: Secondary | ICD-10-CM | POA: Diagnosis present

## 2019-05-28 DIAGNOSIS — I129 Hypertensive chronic kidney disease with stage 1 through stage 4 chronic kidney disease, or unspecified chronic kidney disease: Secondary | ICD-10-CM | POA: Diagnosis present

## 2019-05-28 DIAGNOSIS — R05 Cough: Secondary | ICD-10-CM | POA: Diagnosis not present

## 2019-05-28 DIAGNOSIS — Z20822 Contact with and (suspected) exposure to covid-19: Secondary | ICD-10-CM | POA: Diagnosis not present

## 2019-05-28 DIAGNOSIS — R091 Pleurisy: Secondary | ICD-10-CM | POA: Diagnosis not present

## 2019-05-28 DIAGNOSIS — J930 Spontaneous tension pneumothorax: Secondary | ICD-10-CM | POA: Diagnosis not present

## 2019-05-28 DIAGNOSIS — M0510 Rheumatoid lung disease with rheumatoid arthritis of unspecified site: Secondary | ICD-10-CM | POA: Diagnosis not present

## 2019-05-28 DIAGNOSIS — Z8249 Family history of ischemic heart disease and other diseases of the circulatory system: Secondary | ICD-10-CM | POA: Diagnosis not present

## 2019-05-28 DIAGNOSIS — Z209 Contact with and (suspected) exposure to unspecified communicable disease: Secondary | ICD-10-CM | POA: Diagnosis not present

## 2019-05-28 DIAGNOSIS — R0902 Hypoxemia: Secondary | ICD-10-CM | POA: Diagnosis not present

## 2019-05-28 DIAGNOSIS — J9312 Secondary spontaneous pneumothorax: Secondary | ICD-10-CM | POA: Diagnosis not present

## 2019-05-28 LAB — RESPIRATORY PANEL BY PCR

## 2019-05-28 LAB — COMPREHENSIVE METABOLIC PANEL
ALT: 18 U/L (ref 0–44)
AST: 23 U/L (ref 15–41)
Albumin: 4.3 g/dL (ref 3.5–5.0)
Alkaline Phosphatase: 41 U/L (ref 38–126)
Anion gap: 10 (ref 5–15)
BUN: 22 mg/dL (ref 8–23)
CO2: 24 mmol/L (ref 22–32)
Calcium: 9.3 mg/dL (ref 8.9–10.3)
Chloride: 104 mmol/L (ref 98–111)
Creatinine, Ser: 1.25 mg/dL — ABNORMAL HIGH (ref 0.61–1.24)
GFR calc Af Amer: >60 mL/min (ref >60)
GFR calc non Af Amer: 54 mL/min — ABNORMAL LOW (ref >60)
Glucose, Bld: 120 mg/dL — ABNORMAL HIGH (ref 70–99)
Potassium: 3.8 mmol/L (ref 3.5–5.1)
Sodium: 138 mmol/L (ref 135–145)
Total Bilirubin: 0.8 mg/dL (ref 0.3–1.2)
Total Protein: 8.1 g/dL (ref 6.5–8.1)

## 2019-05-28 LAB — POC SARS CORONAVIRUS 2 AG -  ED: SARS Coronavirus 2 Ag: NEGATIVE

## 2019-05-28 LAB — CBC
HCT: 42.4 % (ref 39.0–52.0)
Hemoglobin: 14.1 g/dL (ref 13.0–17.0)
MCH: 31.5 pg (ref 26.0–34.0)
MCHC: 33.3 g/dL (ref 30.0–36.0)
MCV: 94.9 fL (ref 80.0–100.0)
Platelets: 221 10*3/uL (ref 150–400)
RBC: 4.47 MIL/uL (ref 4.22–5.81)
RDW: 15.5 % (ref 11.5–15.5)
WBC: 9.8 10*3/uL (ref 4.0–10.5)
nRBC: 0 % (ref 0.0–0.2)

## 2019-05-28 LAB — CBG MONITORING, ED: Glucose-Capillary: 120 mg/dL — ABNORMAL HIGH (ref 70–99)

## 2019-05-28 LAB — PROCALCITONIN: Procalcitonin: 0.15 ng/mL

## 2019-05-28 LAB — SARS CORONAVIRUS 2 (TAT 6-24 HRS): SARS Coronavirus 2: NEGATIVE

## 2019-05-28 LAB — MRSA PCR SCREENING: MRSA by PCR: NEGATIVE

## 2019-05-28 MED ORDER — FENTANYL CITRATE (PF) 100 MCG/2ML IJ SOLN
INTRAMUSCULAR | Status: AC
  Administered 2019-05-28: 50 ug
  Filled 2019-05-28: qty 2

## 2019-05-28 MED ORDER — FENTANYL CITRATE (PF) 100 MCG/2ML IJ SOLN
INTRAMUSCULAR | Status: AC
  Administered 2019-05-28: 100 ug via INTRAVENOUS
  Filled 2019-05-28: qty 2

## 2019-05-28 MED ORDER — LIDOCAINE-EPINEPHRINE (PF) 2 %-1:200000 IJ SOLN
INTRAMUSCULAR | Status: AC
  Administered 2019-05-28: 20 mL
  Filled 2019-05-28: qty 20

## 2019-05-28 MED ORDER — CHLORHEXIDINE GLUCONATE CLOTH 2 % EX PADS
6.0000 | MEDICATED_PAD | Freq: Every day | CUTANEOUS | Status: DC
  Administered 2019-05-28 – 2019-06-03 (×5): 6 via TOPICAL

## 2019-05-28 MED ORDER — AMLODIPINE BESYLATE 10 MG PO TABS
10.0000 mg | ORAL_TABLET | Freq: Every day | ORAL | Status: DC
  Administered 2019-05-28 – 2019-06-03 (×7): 10 mg via ORAL
  Filled 2019-05-28 (×7): qty 1

## 2019-05-28 MED ORDER — SODIUM CHLORIDE 0.9 % IV SOLN
2.0000 g | Freq: Every day | INTRAVENOUS | Status: DC
  Administered 2019-05-28 – 2019-05-29 (×2): 2 g via INTRAVENOUS
  Filled 2019-05-28: qty 2
  Filled 2019-05-28: qty 20

## 2019-05-28 MED ORDER — SODIUM CHLORIDE 0.9 % IV SOLN
500.0000 mg | INTRAVENOUS | Status: DC
  Administered 2019-05-28 – 2019-05-29 (×2): 500 mg via INTRAVENOUS
  Filled 2019-05-28 (×2): qty 500

## 2019-05-28 MED ORDER — ADULT MULTIVITAMIN W/MINERALS CH
1.0000 | ORAL_TABLET | Freq: Every day | ORAL | Status: DC
  Administered 2019-05-29 – 2019-06-03 (×6): 1 via ORAL
  Filled 2019-05-28 (×7): qty 1

## 2019-05-28 MED ORDER — PANTOPRAZOLE SODIUM 40 MG PO TBEC
40.0000 mg | DELAYED_RELEASE_TABLET | Freq: Every day | ORAL | Status: DC
  Administered 2019-05-29 – 2019-06-03 (×6): 40 mg via ORAL
  Filled 2019-05-28 (×7): qty 1

## 2019-05-28 MED ORDER — OMEGA-3-ACID ETHYL ESTERS 1 G PO CAPS
ORAL_CAPSULE | Freq: Every day | ORAL | Status: DC
  Administered 2019-05-29 – 2019-06-03 (×6): 1 g via ORAL
  Filled 2019-05-28 (×7): qty 1

## 2019-05-28 MED ORDER — HEPARIN SODIUM (PORCINE) 5000 UNIT/ML IJ SOLN
5000.0000 [IU] | Freq: Three times a day (TID) | INTRAMUSCULAR | Status: DC
  Administered 2019-05-28 – 2019-06-03 (×16): 5000 [IU] via SUBCUTANEOUS
  Filled 2019-05-28 (×16): qty 1

## 2019-05-28 MED ORDER — SODIUM CHLORIDE 0.9 % IV SOLN: 500.0000 mL | INTRAVENOUS | Status: DC

## 2019-05-28 MED ORDER — ONDANSETRON HCL 4 MG/2ML IJ SOLN
4.0000 mg | Freq: Once | INTRAMUSCULAR | Status: AC
  Administered 2019-05-28: 11:00:00 4 mg via INTRAVENOUS
  Filled 2019-05-28: qty 2

## 2019-05-28 MED ORDER — GUAIFENESIN 100 MG/5ML PO SOLN: 200.0000 mg | Freq: Three times a day (TID) | ORAL | Status: DC | PRN

## 2019-05-28 MED ORDER — HYDROCODONE-ACETAMINOPHEN 5-325 MG PO TABS
1.0000 | ORAL_TABLET | ORAL | Status: DC | PRN
  Administered 2019-05-28 – 2019-06-02 (×2): 1 via ORAL
  Filled 2019-05-28 (×2): qty 1

## 2019-05-28 MED ORDER — FENTANYL CITRATE (PF) 100 MCG/2ML IJ SOLN: 100.0000 ug | Freq: Once | INTRAMUSCULAR | Status: AC

## 2019-05-28 MED ORDER — MONTELUKAST SODIUM 10 MG PO TABS
10.0000 mg | ORAL_TABLET | Freq: Every evening | ORAL | Status: DC | PRN
  Filled 2019-05-28: qty 1

## 2019-05-28 MED ORDER — MORPHINE SULFATE (PF) 2 MG/ML IV SOLN: 2.0000 mg | INTRAVENOUS | Status: DC | PRN

## 2019-05-28 MED ORDER — SODIUM CHLORIDE 0.9 % IV SOLN
500.0000 mL | INTRAVENOUS | Status: DC
  Administered 2019-05-28: 500 mL via INTRAVENOUS

## 2019-05-28 NOTE — Progress Notes (Signed)
Called to bedsie  MAP 115.  Patient denies pain At home says is on 2 BP drugs and sbp is 118 Looks stable Watching TV No distress  Plan  0start 1 of 2 home bp drugs - norvasc 10mg  per day Then reassess    SIGNATURE    Dr. Brand Males, M.D., F.C.C.P,  Pulmonary and Critical Care Medicine Staff Physician, [place] Director - Interstitial Lung Disease  Program  Pulmonary [place] at [place], [place], [address]  Pager: [phone], If no answer or between  15:00h - 7:00h: call [phone] Telephone: [phone]  6:35 PM 05/28/2019

## 2019-05-28 NOTE — H&P (Addendum)
NAME:  [name], MRN:  [id number], DOB:  03/31/1941, LOS: 0 ADMISSION DATE:  05/28/2019, CONSULTATION DATE: 1/29 REFERRING MD:  Haywood Pao, CHIEF COMPLAINT: Acute pneumothorax  Brief History   79 year old male never smoker on methotrexate in the setting of rheumatoid arthritis presented to [facility] ER am  1/29 with 2-day history of cough and syncope dx with spontaneous right pneumothorax requiring chest tube decompression.  Pulmonary asked to admit  History of present illness   This is a 79 year old male patient with history as mentioned below was in his usual state of health until 1/28 when began to develop nonproductive cough and associated chest discomfort with cough and shortness of breath.  He did not have fever, denied headache, sore throat, nasal congestion, chest pain with the exception of cough mentioned above, wheezing, nausea, vomiting, lower extremity swelling, palpitations, or sick exposures.  He awoke AM of admit, was coughing standing by his bed, and the next thing he knew he was lying on his bed with his wife shaking him after what appeared to be a syncopal event.  She called EMS, by the time EMS arrived he was reporting resting dyspnea which responded symptomatically with application of oxygen.  In the emergency room a chest x-ray was obtained this demonstrated Large right pneumothorax with what appeared to be tension component as well as right middle lung consolidation.  His initial pulse ox symmetry was 88% on room air this increased to 90% on 5 L.  White blood cell count normal, initial SARS point-of-care testing negative, no fever.  Seen by emergency room physician, a right small bore 14 French chest tube was initially inserted in the emergency room with partial reexpansion, but tip of chest tube pointed downwards, on PCCM arrival new chest tube being placed by emergency room team, at completion 1 out of 7 airleak noted with serous appearing pleural fluid in [place] drainage system.   Pulmonary asked to admit.  Past Medical History  Gastroparesis with prior esophageal stricture requiring dilation last in 2020, rheumatoid arthritis on methotrexate, hypertension.  Chronic cough.  Significant Hospital Events   1/29 admitted.  Chest tube placed, CT chest ordered, empiric antibiotics placed for possible pneumonia   Consults:  None  Procedures:  Right chest tube placed in the emergency room by EDP 1/29>>>  Significant Diagnostic Tests:  CT chest 1/29>>> Pleural cytology 1/29>>>  Micro Data:  Pleural fluid 1/29>>> BCX 2 1/29>>> u strep 1/29>>> Urine legionella Ag 1/29   Antimicrobials:  CTX 1/29>>> azith 1/29>>>  Interim history/subjective:  Feels better after chest tube placed.   Objective   Blood pressure (Abnormal) 143/66, pulse 95, temperature 98.5 F (36.9 C), temperature source Oral, resp. rate 19, height 6\' 1"  (1.854 m), weight 96 kg, SpO2 98 %.        Intake/Output Summary (Last 24 hours) at 05/28/2019 1043 Last data filed at 05/28/2019 0859 Gross per 24 hour  Intake no documentation  Output 55 ml  Net -55 ml   Filed Weights   05/28/19 0651  Weight: 96 kg    Examination: General: Otherwise healthy-appearing 79 year old black male resting in bed now following chest tube placement reports decreased work of breathing HENT: Normocephalic atraumatic no jugular venous distention is appreciated mucous membranes are moist Lungs: Clear, equal chest rise, diminished bases.  Right chest tube with 1 out of 7 airleak, dressing now intact.  Serous drainage in [place] drainage system Cardiovascular: Regular rate and rhythm without murmur rub or gallop Abdomen: Soft nontender no  organomegaly Extremities: Warm and dry, some dry chronic venous stasis otherwise no edema brisk capillary refill Neuro: Awake oriented and without focal deficits GU: Due to void  Resolved Hospital Problem list     Assessment & Plan:   Acute hypoxic respiratory failure in the  setting of spontaneous right pneumothorax, likely exacerbated by cough, rule out community-acquired pneumonia  Portable chest x-ray demonstrating partial reexpansion of right lung, right chest tube oriented towards right base, right mid lung consolidation Plan Admit to stepdown unit Supplemental oxygen with goal saturations greater than 90% Chest tube to 20 cm suction Obtaining CT chest to better identify lung abnormality A.m. chest x-ray Sending urine strep antigen, blood cultures, and trending CBC Empiric antibiotics, starting Rocephin and a azithromycin Hold methotrexate May consider esophagram at some point given history of esophageal stricture  CKD stage III Lab Results  Component Value Date   CREATININE 1.25 (H) 05/28/2019   CREATININE 1.13 11/08/2015   CREATININE 2.03 (H) 11/07/2015  Plan Gentle hydration Renal dose medications A.m. chemistry  History of rheumatoid arthritis Plan Hold methotrexate  History of GERD Plan PPI   Best practice:  Diet: regular  Pain/Anxiety/Delirium protocol (if indicated): PRN morphine for pain  VAP protocol (if indicated): NA DVT prophylaxis: [place] heparin  GI prophylaxis: PPI Glucose control: na Mobility: w/ assist  Code Status: full code  Family Communication: pending.  Disposition: Admit to stepdown unit  Labs   CBC: Recent Labs  Lab 05/28/19 0750  WBC 9.8  HGB 14.1  HCT 42.4  MCV 94.9  PLT [id number]    Basic Metabolic Panel: Recent Labs  Lab 05/28/19 0750  NA 138  K 3.8  CL 104  CO2 24  GLUCOSE 120*  BUN 22  CREATININE 1.25*  CALCIUM 9.3   GFR: Estimated Creatinine Clearance: 58.5 mL/min (A) (by C-G formula based on SCr of 1.25 mg/dL (H)). Recent Labs  Lab 05/28/19 0750  WBC 9.8    Liver Function Tests: Recent Labs  Lab 05/28/19 0750  AST 23  ALT 18  ALKPHOS 41  BILITOT 0.8  PROT 8.1  ALBUMIN 4.3   No results for input(s): LIPASE, AMYLASE in the last 168 hours. No results for input(s): AMMONIA  in the last 168 hours.  ABG No results found for: PHART, PCO2ART, PO2ART, HCO3, TCO2, ACIDBASEDEF, O2SAT   Coagulation Profile: No results for input(s): INR, PROTIME in the last 168 hours.  Cardiac Enzymes: No results for input(s): CKTOTAL, CKMB, CKMBINDEX, TROPONINI in the last 168 hours.  HbA1C: No results found for: HGBA1C  CBG: Recent Labs  Lab 05/28/19 0920  GLUCAP 120*    Review of Systems:   Review of Systems  Constitutional: Positive for malaise/fatigue. Negative for chills, fever and weight loss.  HENT: Negative.   Eyes: Negative.   Respiratory: Positive for cough and shortness of breath.   Cardiovascular: Negative.   Gastrointestinal: Negative.   Genitourinary: Negative.   Musculoskeletal: Negative.   Skin: Negative.   Neurological: Negative.   Endo/Heme/Allergies: Negative.   Psychiatric/Behavioral: Negative.      Past Medical History  He,  has a past medical history of Adenomatous colon polyp, Diverticulosis, GERD (gastroesophageal reflux disease), Hemorrhoids, Hiatal hernia, Hypertension, Rheumatoid arthritis ([place]), and S/P dilatation of esophageal stricture.   Surgical History    Past Surgical History:  Procedure Laterality Date  . CIRCUMCISION    . [place]  2002     Social History   reports that he has never smoked. He has never  used smokeless tobacco. He reports that he does not drink alcohol or use drugs.   Family History   His family history includes Heart disease in his brother, father, and mother; Prostate cancer in his paternal uncle. There is no history of Colon cancer, Throat cancer, Diabetes, Kidney disease, or Liver disease.   Allergies No Known Allergies   Home Medications  Prior to Admission medications   Medication Sig Start Date End Date Taking? Authorizing Provider  amLODipine (NORVASC) 10 MG tablet Take 1 tablet (10 mg total) by mouth daily. 11/08/15  Yes Thurnell Lose, MD  cetirizine (ZYRTEC) 10 MG tablet Take  10 mg by mouth daily as needed for allergies.   Yes [provider]  guaifenesin (ROBITUSSIN) 100 MG/5ML syrup Take 200 mg by mouth 3 (three) times daily as needed for cough.   Yes [provider]  Menthol-Methyl Salicylate (MUSCLE RUB) 10-15 % CREA Apply 1 application topically as needed for muscle pain (shoulder/neck).   Yes [provider]  methotrexate (RHEUMATREX) 2.5 MG tablet Take 10 mg by mouth See admin instructions. Take 10mg  every Thursday and Friday   Yes [provider]  montelukast (SINGULAIR) 10 MG tablet Take 10 mg by mouth at bedtime as needed (allergies/sleep).    Yes [provider]  Multiple Vitamin (MULTIVITAMIN WITH MINERALS) TABS tablet Take 1 tablet by mouth daily.   Yes [provider]  Omega-3 Fatty Acids (OMEGA 3 PO) Take 1 tablet by mouth daily.   Yes [provider]  omeprazole (PRILOSEC) 20 MG capsule Take 1 capsule (20 mg total) by mouth daily. Patient needs office visit for further refills!!! 04/08/19  Yes Irene Shipper, MD  valsartan-hydrochlorothiazide (DIOVAN-HCT) 320-25 MG tablet Take 1 tablet by mouth daily. 04/19/19  Yes [provider]     Critical care time: NA  Erick Colace ACNP-BC [place] Pager # [phone] OR # [phone] if no answer     I, Dr. Shellee Milo, have personally reviewed patient's available data, including medical history, events of note, physical examination and test results as part of my evaluation. I have discussed with NP Kary Kos   and other care providers all aspects of the patients PCCM issues as listed.  In addition,  I have personally evaluated the patient and assisted in the formulation of the management plan.    Feeling much better p R CT placed by EDP p clear hx of cough syncope in setting of acute on chronic cough ? Etiology so rx as CAP and check ct chest for evidence of blegs/ RA or MTX lung dz in progress    Christinia Gully,  MD Pulmonary and [place] [phone] After 5:30 PM or weekends, use Bee[facility] [phone]

## 2019-05-28 NOTE — ED Provider Notes (Addendum)
[facility] DEPT Provider Note   CSN: [id number] Arrival date & time: 05/28/19  [id number]     History Chief Complaint  Patient presents with  . Shortness of Breath    [name] is a 79 y.o. male.  HPI 79 yo male ho RA, hypertension presents today complaining of cough and syncope.  Patient states awoke and standing by bed and started coughing, next thing he knows he is laying on the bed with his wife shaking him.  EMS, called by her, arrived, patient felt sob and states ems took vitals and transported.  Now patient states he feels good with oxygen on but felt sob when they took it off to get in the bed.    Past Medical History:  Diagnosis Date  . Adenomatous colon polyp   . Diverticulosis   . GERD (gastroesophageal reflux disease)   . Hemorrhoids    internal and external  . Hiatal hernia   . Hypertension   . Rheumatoid arthritis ([place])   . S/P dilatation of esophageal stricture     Patient Active Problem List   Diagnosis Date Noted  . ARF (acute renal failure) ([place]) 11/08/2015  . AKI (acute kidney injury) ([place]) 11/07/2015  . Normocytic anemia 12/01/2014  . FLATULENCE-GAS-BLOATING 10/03/2009  . ARTHRITIS, RHEUMATOID 06/25/2007  . HEMORRHOIDS 12/24/2005  . ESOPHAGEAL STRICTURE 12/24/2005  . GERD 12/24/2005  . HIATAL HERNIA 12/24/2005  . DIVERTICULOSIS, COLON 12/24/2005  . ESOPHAGITIS, REFLUX 02/18/2001  . COLONIC POLYPS, HYPERPLASTIC 02/10/2001    Past Surgical History:  Procedure Laterality Date  . CIRCUMCISION    . HEMORRHOID SURGERY  2002       Family History  Problem Relation Age of Onset  . Heart disease Mother   . Heart disease Father   . Heart disease Brother   . Prostate cancer Paternal Uncle   . Colon cancer Neg Hx   . Throat cancer Neg Hx   . Diabetes Neg Hx   . Kidney disease Neg Hx   . Liver disease Neg Hx     Social History   Tobacco Use  . Smoking status: Never Smoker  . Smokeless tobacco: Never Used    Substance Use Topics  . Alcohol use: No  . Drug use: No    Home Medications Prior to Admission medications   Medication Sig Start Date End Date Taking? Authorizing Provider  acetaminophen (TYLENOL) 325 MG tablet Take 325 mg by mouth every 6 (six) hours as needed (pain).    [provider]  amLODipine (NORVASC) 10 MG tablet Take 1 tablet (10 mg total) by mouth daily. 11/08/15   Thurnell Lose, MD  cetirizine (ZYRTEC) 10 MG tablet Take 10 mg by mouth daily as needed for allergies.    [provider]  methotrexate (RHEUMATREX) 2.5 MG tablet 7.5 mg. Take 4 tablets every Thursday and Friday    [provider]  montelukast (SINGULAIR) 10 MG tablet Take 10 mg by mouth at bedtime.    [provider]  Multiple Vitamin (MULTIVITAMIN WITH MINERALS) TABS tablet Take 1 tablet by mouth daily.    [provider]  Omega-3 Fatty Acids (OMEGA 3 PO) Take 1 tablet by mouth daily.    [provider]  omeprazole (PRILOSEC) 20 MG capsule Take 1 capsule (20 mg total) by mouth daily. Patient needs office visit for further refills!!! 04/08/19   Irene Shipper, MD  valsartan-hydrochlorothiazide (DIOVAN-HCT) 80-12.5 MG tablet Take 1 tablet by mouth daily. 10/02/15  [provider]    Allergies    Patient has no known allergies.  Review of Systems   Review of Systems  Constitutional: Negative.   HENT: Negative.   Eyes: Negative.   Respiratory: Positive for cough and shortness of breath. Negative for wheezing.   Cardiovascular: Negative.  Negative for chest pain and leg swelling.  Gastrointestinal: Negative.   Endocrine: Negative.   Genitourinary: Negative.   Musculoskeletal: Positive for neck pain. Negative for arthralgias, back pain, gait problem, joint swelling, myalgias and neck stiffness.  Skin: Negative.   Allergic/Immunologic: Positive for immunocompromised state.  Neurological: Positive for dizziness and syncope.  Hematological: Negative.    Psychiatric/Behavioral: Negative.   All other systems reviewed and are negative.   Physical Exam Updated Vital Signs BP 137/76   Pulse 98   Temp 98.5 F (36.9 C) (Oral)   Resp (!) 24   Ht 1.854 m (6\' 1" )   Wt 96 kg   SpO2 98%   BMI 27.92 kg/m   Physical Exam Vitals and nursing note reviewed.  Constitutional:      General: He is in acute distress.     Appearance: He is well-developed.  HENT:     Head: Normocephalic.  Eyes:     Extraocular Movements: Extraocular movements intact.     Pupils: Pupils are equal, round, and reactive to light.  Cardiovascular:     Rate and Rhythm: Normal rate and regular rhythm.  Pulmonary:     Effort: Pulmonary effort is normal.     Breath sounds: Examination of the right-upper field reveals decreased breath sounds. Examination of the right-middle field reveals decreased breath sounds. Examination of the right-lower field reveals decreased breath sounds. Decreased breath sounds present.  Chest:     Chest wall: No mass.  Abdominal:     Palpations: Abdomen is soft.  Musculoskeletal:        General: Normal range of motion.     Cervical back: Normal range of motion and neck supple.  Skin:    General: Skin is warm and dry.     Capillary Refill: Capillary refill takes less than 2 seconds.  Neurological:     General: No focal deficit present.     Mental Status: He is alert.  Psychiatric:        Mood and Affect: Mood normal.        Behavior: Behavior normal.     ED Results / Procedures / Treatments   Labs (all labs ordered are listed, but only abnormal results are displayed) Labs Reviewed - No data to display  EKG EKG Interpretation  Date/Time:  Friday May 28 2019 07:48:01 EST Ventricular Rate:  103 PR Interval:    QRS Duration: 92 QT Interval:  335 QTC Calculation: 439 R Axis:   100 Text Interpretation: Sinus tachycardia Probable anterior infarct, old Minimal ST depression, inferior leads Confirmed by Pattricia Boss [phone])  on 05/28/2019 9:13:54 AM   Radiology DG Chest Port 1 View  Result Date: 05/28/2019 CLINICAL DATA:  Shortness of breath and syncope EXAM: PORTABLE CHEST 1 VIEW COMPARISON:  October 25, 2013 FINDINGS: There is a large pneumothorax on the right with shift of heart and mediastinum to the left, consistent with tension component. There is consolidation in the collapsed lung centrally. There is mild scarring in the left base. There is no edema or airspace opacity on the left. The heart size is normal. Pulmonary vascularity on the left is normal. There is distortion of pulmonary vascularity on the  right due to the tension pneumothorax. There is aortic atherosclerosis. There is degenerative change in the thoracic spine. IMPRESSION: Large right pneumothorax with tension component. Consolidation in central portion of collapsed lung. Stable scarring left base. Cardiac size normal. Aortic Atherosclerosis (ICD10-I70.0). Critical Value/emergent results were called by telephone at the time of interpretation on 05/28/2019 at 8:02 am to provider Domenic Moras, PA, who verbally acknowledged these results. Electronically Signed   By: Lowella Grip III M.D.   On: 05/28/2019 08:02    Procedures CHEST TUBE INSERTION  Date/Time: 05/28/2019 8:54 AM Performed by: Pattricia Boss, MD Authorized by: Pattricia Boss, MD   Consent:    Consent obtained:  Written   Consent given by:  Patient   Risks discussed:  Incomplete drainage, bleeding, pain, infection and damage to surrounding structures Pre-procedure details:    Skin preparation:  Betadine   Preparation: Patient was prepped and draped in the usual sterile fashion   Sedation:    Sedation type:  Anxiolysis Anesthesia (see MAR for exact dosages):    Anesthesia method:  Local infiltration   Local anesthetic:  Lidocaine 1% WITH epi Procedure details:    Placement location:  R lateral   Scalpel size:  11   Tube size (French): 14.   Ultrasound guidance: no     Tension  pneumothorax: yes     Tube connected to:  Heimlich valve and suction   Drainage characteristics:  Serosanguinous   Suture material:  2-0 silk   Dressing:  4x4 sterile gauze Post-procedure details:    Post-insertion x-Siennah Barrasso findings: tube repositioned     Patient tolerance of procedure:  Tolerated well, no immediate complications Comments:     Original tube low, tube pulled and replaced. Awaiting repeat x-Altheria Shadoan Patient remained hemodynamically stable  jRepeat cxr with better tube placment and lung reexpansion 11:03 AM  .Critical Care Performed by: Pattricia Boss, MD Authorized by: Pattricia Boss, MD   Critical care provider statement:    Critical care time (minutes):  45   Critical care end time:  05/28/2019 9:14 AM   Critical care was necessary to treat or prevent imminent or life-threatening deterioration of the following conditions:  Circulatory failure and respiratory failure   Critical care was time spent personally by me on the following activities:  Discussions with consultants, evaluation of patient's response to treatment, examination of patient, ordering and performing treatments and interventions, ordering and review of laboratory studies, ordering and review of radiographic studies, pulse oximetry, re-evaluation of patient's condition, obtaining history from patient or surrogate and review of old charts   (including critical care time)  Medications Ordered in ED Medications - No data to display  ED Course  I have reviewed the triage vital signs and the nursing notes.  Pertinent labs & imaging results that were available during my care of the patient were reviewed by me and considered in my medical decision making (see chart for details).    MDM Rules/Calculators/A&P                      79 yo male presents with cough and sudden onset of dyspnea.  No lung sounds on cxr- no fever or infectious symptoms.  Chest tube placed with some serosanguinous flui and air.  Patient  remained hemodynamically stable throughout.  Plan admission for observation, chest tube management.  9:12 AM  Discussed with Dr. Melvyn Novas.  We discussed sending fluid for cytology.  I discussed this with the lab.  I also put  in for cell count Gram stain, culture.  Advised the cytology tech that we would prefer cytology depending on the amount of fluid. Critical care at bedside  11:03 AM Patient feels improved hemodynamically stable Final Clinical Impression(s) / ED Diagnoses Final diagnoses:  Tension pneumothorax, spontaneous  Syncope, unspecified syncope type  Cough    Rx / DC Orders ED Discharge Orders    None       Pattricia Boss, MD 05/28/19 1103    Pattricia Boss, MD 07/07/19 1256

## 2019-05-28 NOTE — Progress Notes (Signed)
Pharmacy Antibiotic Note  [name] is a 79 y.o. male admitted on 05/28/2019 with pneumonia.  Pharmacy has been consulted for ceftriaxone dosing. R chest tube placed in the ED. AF, WBC WNL.   Plan: Ceftriaxone 2 gm IV q24 Azithromycin 500 mg IV q24  Height: 6\' 1"  (185.4 cm) Weight: 211 lb 10.3 oz (96 kg) IBW/kg (Calculated) : 79.9  Temp (24hrs), Avg:98.5 F (36.9 C), Min:98.5 F (36.9 C), Max:98.5 F (36.9 C)  Recent Labs  Lab 05/28/19 0750  WBC 9.8  CREATININE 1.25*    Estimated Creatinine Clearance: 58.5 mL/min (A) (by C-G formula based on SCr of 1.25 mg/dL (H)).    No Known Allergies  Antimicrobials this admission: 1/29 CTX>> 1/29 azith>> Dose adjustments this admission:  Microbiology results: 1/29 covid neg 1/29 BCx2: ordered 1/29 chest tube fluid cx: ip 1/29 strep pneumo Uag: ordered  Thank you for allowing pharmacy to be a part of this patient's care.  Eudelia Bunch, Pharm.D [phone] 05/28/2019 10:34 AM

## 2019-05-28 NOTE — ED Notes (Signed)
[name], wife of 24 years would like an update on her husband, [phone] from the doctor.

## 2019-05-28 NOTE — ED Provider Notes (Signed)
R chest tube insertion at the direct supervision of Dr. Jeanell Sparrow as treatment of tension pneumothorax.  Procedures CHEST TUBE INSERTION  Date/Time: 05/28/2019 9:02 AM Performed by: Domenic Moras, PA-C Authorized by: Domenic Moras, PA-C   Consent:    Consent obtained:  Written   Consent given by:  Patient   Risks discussed:  Damage to surrounding structures, infection and pain   Alternatives discussed:  Referral and delayed treatment Pre-procedure details:    Skin preparation:  ChloraPrep   Preparation: Patient was prepped and draped in the usual sterile fashion   Anesthesia (see MAR for exact dosages):    Anesthesia method:  Local infiltration   Local anesthetic:  Lidocaine 2% WITH epi Procedure details:    Placement location:  R lateral   Scalpel size:  11   Tube size ([place]): pig tail catheter.   Ultrasound guidance: no     Tension pneumothorax: yes     Tube connected to:  Suction   Drainage characteristics:  Serosanguinous   Suture material:  0 silk   Dressing:  4x4 sterile gauze Post-procedure details:    Post-insertion x-ray findings: tube in good position     Patient tolerance of procedure:  Tolerated well, no immediate complications   Domenic Moras, PA-C 05/28/19 [id number]    Pattricia Boss, MD 05/29/19 [phone]

## 2019-05-28 NOTE — Progress Notes (Signed)
Informed Dr. Chase Caller that patients blood pressures were increasing into the [id number] systolic this afternoon and that his home blood pressure medications were not started. He stated he would look at them and add them back if necessary.

## 2019-05-28 NOTE — ED Triage Notes (Signed)
Pt presents to ED via [facility] from home c/o ShOB and dry cough since this AM. EMS arrived and pt was 88% on RA. EMS placed pt on 5 LPM [place] and it only raised pts O2 to 90%. EMS then tried pt on NRB at 10 LPM and it raised pts O2 to 96%. Pt stated he has a hx of pneumonia and this feels the same. Pt is afebrile at this time, no know COVID exposures and no recent tests but did get his first COVID vaccine last week.

## 2019-05-29 ENCOUNTER — Inpatient Hospital Stay (HOSPITAL_COMMUNITY): Payer: Medicare HMO

## 2019-05-29 DIAGNOSIS — M0510 Rheumatoid lung disease with rheumatoid arthritis of unspecified site: Secondary | ICD-10-CM

## 2019-05-29 LAB — CBC
HCT: 37.1 % — ABNORMAL LOW (ref 39.0–52.0)
Hemoglobin: 12.1 g/dL — ABNORMAL LOW (ref 13.0–17.0)
MCH: 31.5 pg (ref 26.0–34.0)
MCHC: 32.6 g/dL (ref 30.0–36.0)
MCV: 96.6 fL (ref 80.0–100.0)
Platelets: 186 10*3/uL (ref 150–400)
RBC: 3.84 MIL/uL — ABNORMAL LOW (ref 4.22–5.81)
RDW: 15.6 % — ABNORMAL HIGH (ref 11.5–15.5)
WBC: 6.5 10*3/uL (ref 4.0–10.5)
nRBC: 0 % (ref 0.0–0.2)

## 2019-05-29 LAB — STREP PNEUMONIAE URINARY ANTIGEN: Strep Pneumo Urinary Antigen: NEGATIVE

## 2019-05-29 LAB — BASIC METABOLIC PANEL
Anion gap: 11 (ref 5–15)
BUN: 21 mg/dL (ref 8–23)
CO2: 24 mmol/L (ref 22–32)
Calcium: 9 mg/dL (ref 8.9–10.3)
Chloride: 103 mmol/L (ref 98–111)
Creatinine, Ser: 1.15 mg/dL (ref 0.61–1.24)
GFR calc Af Amer: >60 mL/min (ref >60)
GFR calc non Af Amer: >60 mL/min (ref >60)
Glucose, Bld: 127 mg/dL — ABNORMAL HIGH (ref 70–99)
Potassium: 3.4 mmol/L — ABNORMAL LOW (ref 3.5–5.1)
Sodium: 138 mmol/L (ref 135–145)

## 2019-05-29 MED ORDER — HYDRALAZINE HCL 50 MG PO TABS: 50.0000 mg | ORAL_TABLET | Freq: Four times a day (QID) | ORAL | Status: DC | PRN

## 2019-05-29 MED ORDER — POTASSIUM CHLORIDE CRYS ER 20 MEQ PO TBCR
40.0000 meq | EXTENDED_RELEASE_TABLET | Freq: Once | ORAL | Status: AC
  Administered 2019-05-29: 14:00:00 40 meq via ORAL
  Filled 2019-05-29: qty 2

## 2019-05-29 MED ORDER — SODIUM CHLORIDE 0.9 % IV SOLN: 500.0000 mL | INTRAVENOUS | Status: DC | PRN

## 2019-05-29 NOTE — Progress Notes (Signed)
Attempted to call pt's wife at listed number.  No answer.  Chesley Mires, MD [facility] Pulmonary/Critical Care 05/29/2019, 2:05 PM

## 2019-05-29 NOTE — Progress Notes (Signed)
   Vital Signs MEWS/VS Documentation      05/29/2019 0730 05/29/2019 1407 05/29/2019 1902 05/29/2019 2038   MEWS Score:  0  0  0  0   MEWS Score Color:  Green  Green  Green  Green   Resp:  --  20  --  18   Pulse:  --  79  --  74   BP:  --  (!) 144/61  --  (!) 142/79   Temp:  --  98.1 F (36.7 C)  --  98.5 F (36.9 C)   O2 Device:  --  Room Air  --  Room Air     Patient has history of HTN. Alert and oriented x 4. Medication was given as MD ordered. Will monitor patient closely.      Gaytha Raybourn M Kareem Cathey 05/29/2019,9:20 PM

## 2019-05-29 NOTE — Progress Notes (Signed)
[place] Progress Note Patient Name: [name] DOB: 1941/04/25 MRN: [id number]   Date of Service  05/29/2019  HPI/Events of Note  BP 214/77 HR 81. Patient seen awake and appears comfortable. Amlodipine 10 mg already restarted. Was also on valsartan/HCTZ 320/25 which has not been resumed  eICU Interventions  Due to AKI will hold off on ARB and HCTZ. Ordered hydralazine 50 mg q 6 prn for SBP > 170     Intervention Category Major Interventions: Hypertension - evaluation and management  Judd Lien 05/29/2019, 12:56 AM

## 2019-05-29 NOTE — Progress Notes (Signed)
NAME:  [name], MRN:  [id number], DOB:  Sep 12, 1940, LOS: 1 ADMISSION DATE:  05/28/2019, CONSULTATION DATE:  05/28/2019 REFERRING MD:  Rona Ravens, ER, CHIEF COMPLAINT:  Short of breath   Brief History   79 yo male with hx of RA on MTX presented with 2 days of cough and syncope.  Found to have Rt spontaneous pneumothorax and had chest tube placed in ER.  PCCM asked to admit.  Past Medical History  Esophageal stricture s/p dilation 2020, Gastroparesis, HTN, Chronic cough, RA  Significant Hospital Events   1/29 Admit  Consults:    Procedures:  Rt chest tube 1/29 >>   Significant Diagnostic Tests:  CT chest 1/29 >> atherosclerosis, small HH, mod/severe paraseptal emphysema, subpleural reticulation and mild architectural distortion b/l, areas of GGO b/l  Micro Data:  SARS CoV2 Ag 1/29 >> negative SARS CoV2 TAT 1/29 >> negative RVP 1/29 >> negative Pneumococcal Ag 1/29 >> negative Legionella Ag 1/29 >>   Antimicrobials:  Rocephin 1/29 >> 1/30  Zithromax 1/29 >> 1/30  Interim history/subjective:  Denies cough, sputum.  Breathing better.  Objective   Blood pressure (!) 153/67, pulse 81, temperature 98.1 F (36.7 C), temperature source Oral, resp. rate 16, height 6\' 1"  (1.854 m), weight 96.9 kg, SpO2 95 %.        Intake/Output Summary (Last 24 hours) at 05/29/2019 1209 Last data filed at 05/29/2019 1109 Gross per 24 hour  Intake 1279.75 ml  Output 1550 ml  Net -270.25 ml   Filed Weights   05/28/19 0651 05/29/19 0401  Weight: 96 kg 96.9 kg    Examination:  General - alert Eyes - pupils reactive ENT - no sinus tenderness, no stridor Cardiac - regular rate/rhythm, no murmur Chest - scattered rhonchi, Rt chest tube with 1+ air leak Abdomen - soft, non tender, + bowel sounds Extremities - no cyanosis, clubbing, or edema Skin - no rashes Neuro - normal strength, moves extremities, follows commands Psych - normal mood and behavior  CXR (reviewed by me) - small Rt  apical PTX   Resolved Hospital Problem list     Assessment & Plan:   Spontaneous Rt tension pneumothorax. - likely has ILD in setting of RA - continue chest tube to -20 cm H2O suction - f/u CXR - no evidence for infection >> d/c ABx  Hx of HTN. - continue norvasc  Hx of hiatal hernia, reflux. - protonix  Hx of rheumatoid arthritis. - hold outpt MTX  Hypokalemia. - replace potassium  Best practice:  Diet: regular DVT prophylaxis: SQ heparin GI prophylaxis: protonix Mobility: OOB to chair Code Status: full code Disposition: floor bed  Labs    CMP Latest Ref Rng & Units 05/29/2019 05/28/2019 11/08/2015  Glucose 70 - 99 mg/dL 127(H) 120(H) 102(H)  BUN 8 - 23 mg/dL 21 22 21(H)  Creatinine 0.61 - 1.24 mg/dL 1.15 1.25(H) 1.13  Sodium 135 - 145 mmol/L 138 138 139  Potassium 3.5 - 5.1 mmol/L 3.4(L) 3.8 3.7  Chloride 98 - 111 mmol/L 103 104 110  CO2 22 - 32 mmol/L 24 24 24   Calcium 8.9 - 10.3 mg/dL 9.0 9.3 8.5(L)  Total Protein 6.5 - 8.1 g/dL - 8.1 -  Total Bilirubin 0.3 - 1.2 mg/dL - 0.8 -  Alkaline Phos 38 - 126 U/L - 41 -  AST 15 - 41 U/L - 23 -  ALT 0 - 44 U/L - 18 -    CBC Latest Ref Rng & Units 05/29/2019 05/28/2019 11/08/2015  WBC 4.0 - 10.5 K/uL 6.5 9.8 6.2  Hemoglobin 13.0 - 17.0 g/dL 12.1(L) 14.1 11.7(L)  Hematocrit 39.0 - 52.0 % 37.1(L) 42.4 34.0(L)  Platelets 150 - 400 K/uL 186 221 196    Chesley Mires, MD [place] 05/29/2019, 12:13 PM

## 2019-05-29 NOTE — Plan of Care (Signed)
  Problem: Education: Goal: Knowledge of General Education information will improve Description: Including pain rating scale, medication(s)/side effects and non-pharmacologic comfort measures Outcome: Progressing   Problem: Health Behavior/Discharge Planning: Goal: Ability to manage health-related needs will improve Outcome: Progressing   Problem: Clinical Measurements: Goal: Respiratory complications will improve Outcome: Progressing   Problem: Nutrition: Goal: Adequate nutrition will be maintained Outcome: Progressing   Problem: Coping: Goal: Level of anxiety will decrease Outcome: Progressing   Problem: Pain Managment: Goal: General experience of comfort will improve Outcome: Progressing   

## 2019-05-30 ENCOUNTER — Inpatient Hospital Stay (HOSPITAL_COMMUNITY): Payer: Medicare HMO

## 2019-05-30 LAB — BASIC METABOLIC PANEL
Anion gap: 9 (ref 5–15)
BUN: 18 mg/dL (ref 8–23)
CO2: 22 mmol/L (ref 22–32)
Calcium: 8.8 mg/dL — ABNORMAL LOW (ref 8.9–10.3)
Chloride: 106 mmol/L (ref 98–111)
Creatinine, Ser: 1.12 mg/dL (ref 0.61–1.24)
GFR calc Af Amer: >60 mL/min (ref >60)
GFR calc non Af Amer: >60 mL/min (ref >60)
Glucose, Bld: 101 mg/dL — ABNORMAL HIGH (ref 70–99)
Potassium: 4.3 mmol/L (ref 3.5–5.1)
Sodium: 137 mmol/L (ref 135–145)

## 2019-05-30 LAB — LEGIONELLA PNEUMOPHILA SEROGP 1 UR AG: L. pneumophila Serogp 1 Ur Ag: NEGATIVE

## 2019-05-30 LAB — MAGNESIUM: Magnesium: 2 mg/dL (ref 1.7–2.4)

## 2019-05-30 NOTE — Progress Notes (Signed)
NAME:  [name], MRN:  [id number], DOB:  1940-08-29, LOS: 2 ADMISSION DATE:  05/28/2019, CONSULTATION DATE:  05/28/2019 REFERRING MD:  Rona Ravens, ER, CHIEF COMPLAINT:  Short of breath   Brief History   79 yo male with hx of RA on MTX presented with 2 days of cough and syncope.  Found to have Rt spontaneous pneumothorax and had chest tube placed in ER.  PCCM asked to admit.  Past Medical History  Esophageal stricture s/p dilation 2020, Gastroparesis, HTN, Chronic cough, RA  Significant Hospital Events   1/29 Admit 1/31 change chest tube to water seal  Consults:    Procedures:  Rt chest tube 1/29 >>   Significant Diagnostic Tests:  CT chest 1/29 >> atherosclerosis, small HH, mod/severe paraseptal emphysema, subpleural reticulation and mild architectural distortion b/l, areas of GGO b/l  Micro Data:  SARS CoV2 Ag 1/29 >> negative SARS CoV2 TAT 1/29 >> negative RVP 1/29 >> negative Pneumococcal Ag 1/29 >> negative Legionella Ag 1/29 >> negative  Antimicrobials:  Rocephin 1/29 >> 1/30  Zithromax 1/29 >> 1/30  Interim history/subjective:  Denies cough, dyspnea, or chest pain.  Objective   Blood pressure 136/66, pulse 77, temperature 98.2 F (36.8 C), temperature source Oral, resp. rate 19, height 6\' 1"  (1.854 m), weight 96.1 kg, SpO2 98 %.        Intake/Output Summary (Last 24 hours) at 05/30/2019 1049 Last data filed at 05/30/2019 0916 Gross per 24 hour  Intake 881 ml  Output 1400 ml  Net -519 ml   Filed Weights   05/28/19 0651 05/29/19 0401 05/30/19 0552  Weight: 96 kg 96.9 kg 96.1 kg    Examination:  General - alert Eyes - pupils reactive ENT - no sinus tenderness, no stridor Cardiac - regular rate/rhythm, no murmur Chest - equal breath sounds b/l, no wheezing or rales, Rt chest tube in place w/o air leak Abdomen - soft, non tender, + bowel sounds Extremities - no cyanosis, clubbing, or edema Skin - no rashes Neuro - normal strength, moves extremities,  follows commands Psych - normal mood and behavior  CXR (reviewed by me) - very tiny Rt apical PTX, ILD changes    Resolved Hospital Problem list   Community acquired pneumonia ruled out  Assessment & Plan:   Spontaneous Rt tension pneumothorax. - likely has ILD in setting of RA - change chest tube to water seal 1/31 - f/u CXR - will need outpt pulmonary follow up with ILD specialist  Hx of HTN. - continue norvasc  Hx of hiatal hernia, reflux. - continue protonix  Hx of rheumatoid arthritis. - resume MTX as outpt  Hypokalemia. - improved - f/u BMET intermittently  Best practice:  Diet: regular DVT prophylaxis: SQ heparin GI prophylaxis: protonix Mobility: OOB to chair Code Status: full code Disposition: floor bed  Labs    CMP Latest Ref Rng & Units 05/30/2019 05/29/2019 05/28/2019  Glucose 70 - 99 mg/dL 101(H) 127(H) 120(H)  BUN 8 - 23 mg/dL 18 21 22   Creatinine 0.61 - 1.24 mg/dL 1.12 1.15 1.25(H)  Sodium 135 - 145 mmol/L 137 138 138  Potassium 3.5 - 5.1 mmol/L 4.3 3.4(L) 3.8  Chloride 98 - 111 mmol/L 106 103 104  CO2 22 - 32 mmol/L 22 24 24   Calcium 8.9 - 10.3 mg/dL 8.8(L) 9.0 9.3  Total Protein 6.5 - 8.1 g/dL - - 8.1  Total Bilirubin 0.3 - 1.2 mg/dL - - 0.8  Alkaline Phos 38 - 126 U/L - - 41  AST 15 - 41 U/L - - 23  ALT 0 - 44 U/L - - 18    CBC Latest Ref Rng & Units 05/29/2019 05/28/2019 11/08/2015  WBC 4.0 - 10.5 K/uL 6.5 9.8 6.2  Hemoglobin 13.0 - 17.0 g/dL 12.1(L) 14.1 11.7(L)  Hematocrit 39.0 - 52.0 % 37.1(L) 42.4 34.0(L)  Platelets 150 - 400 K/uL 186 221 196    Chesley Mires, MD [place] 05/30/2019, 10:49 AM

## 2019-05-30 NOTE — Progress Notes (Signed)
Spoke with pt's wife.  Updated about progress regarding pneumothorax.  Also discussed concerned for ILD and possibility it could be related to rheumatoid arthritis, but this would need to be further investigated as outpt.  Chesley Mires, MD [facility] Pulmonary/Critical Care 05/30/2019, 1:51 PM

## 2019-05-31 ENCOUNTER — Inpatient Hospital Stay (HOSPITAL_COMMUNITY): Payer: Medicare HMO

## 2019-05-31 LAB — CYTOLOGY - NON PAP

## 2019-05-31 NOTE — Progress Notes (Signed)
Chest tube was clamped about noon  Chest x-ray 7 hours later-stable pneumothorax  Will leave tube clamped Repeat chest x-ray in the morning and if pneumothorax is stable will plan to remove chest tube  Instructions left for tube to be unclamped if patient were to complain of chest pain or shortness of breath

## 2019-05-31 NOTE — Progress Notes (Signed)
NAME:  [name], MRN:  [id number], DOB:  [birth year], LOS: 3 ADMISSION DATE:  05/28/2019, CONSULTATION DATE:  05/28/2019 REFERRING MD:  Rona Ravens, ER, CHIEF COMPLAINT:  Short of breath   Brief History   79 yo male with hx of RA on MTX presented with 2 days of cough and syncope.  Found to have Rt spontaneous pneumothorax and had chest tube placed in ER.  PCCM asked to admit.  Past Medical History  Esophageal stricture s/p dilation 2020, Gastroparesis, HTN, Chronic cough, RA  Significant Hospital Events   1/29 Admit 1/31 change chest tube to water seal 2/1 chest tube clamped  Consults:    Procedures:  Rt chest tube 1/29 >>   Significant Diagnostic Tests:  CT chest 1/29 >> atherosclerosis, small HH, mod/severe paraseptal emphysema, subpleural reticulation and mild architectural distortion b/l, areas of GGO b/l  Micro Data:  SARS CoV2 Ag 1/29 >> negative SARS CoV2 TAT 1/29 >> negative RVP 1/29 >> negative Pneumococcal Ag 1/29 >> negative Legionella Ag 1/29 >> negative  Antimicrobials:  Rocephin 1/29 >> 1/30  Zithromax 1/29 >> 1/30  Interim history/subjective:  Denies any significant symptoms  Objective   Blood pressure (!) 150/67, pulse 74, temperature 98.2 F (36.8 C), temperature source Oral, resp. rate 20, height 6\' 1"  (1.854 m), weight 94.8 kg, SpO2 99 %.        Intake/Output Summary (Last 24 hours) at 05/31/2019 1533 Last data filed at 05/31/2019 1449 Gross per 24 hour  Intake 1343 ml  Output 1565 ml  Net -222 ml   Filed Weights   05/29/19 0401 05/30/19 0552 05/31/19 0500  Weight: 96.9 kg 96.1 kg 94.8 kg    Examination:  General -alert and oriented  HEENT- pupils reactive, moist oral mucosa Cardiac - regular rate/rhythm, no murmur Chest - equal breath sounds b/l, no wheezing or rales, Rt chest tube in place w/o air leak Abdomen - soft, non tender, + bowel sounds Extremities - no cyanosis, clubbing, or edema Skin - no rashes Neuro - normal strength, moves  extremities, follows commands Psych - normal mood and behavior  CXR (reviewed by me) - very tiny Rt apical PTX, ILD changes    Resolved Hospital Problem list   Community acquired pneumonia ruled out  Assessment & Plan:   Spontaneous Rt tension pneumothorax. - likely has ILD in setting of RA -Chest tube clamped today - f/u CXR-chest x-ray ordered for this afternoon - Will likely be able to get rid of chest tube in a.m.  Hx of HTN. -Continue Norvasc  Hx of hiatal hernia, reflux. -Continue Protonix  Hx of rheumatoid arthritis. - resume MTX as outpt  Hypokalemia. - improved - f/u BMET intermittently  Best practice:  Diet: regular DVT prophylaxis: SQ heparin GI prophylaxis: protonix Mobility: OOB to chair Code Status: full code Disposition: floor bed  Labs    CMP Latest Ref Rng & Units 05/30/2019 05/29/2019 05/28/2019  Glucose 70 - 99 mg/dL 101(H) 127(H) 120(H)  BUN 8 - 23 mg/dL 18 21 22   Creatinine 0.61 - 1.24 mg/dL 1.12 1.15 1.25(H)  Sodium 135 - 145 mmol/L 137 138 138  Potassium 3.5 - 5.1 mmol/L 4.3 3.4(L) 3.8  Chloride 98 - 111 mmol/L 106 103 104  CO2 22 - 32 mmol/L 22 24 24   Calcium 8.9 - 10.3 mg/dL 8.8(L) 9.0 9.3  Total Protein 6.5 - 8.1 g/dL - - 8.1  Total Bilirubin 0.3 - 1.2 mg/dL - - 0.8  Alkaline Phos 38 - 126 U/L - -  41  AST 15 - 41 U/L - - 23  ALT 0 - 44 U/L - - 18    CBC Latest Ref Rng & Units 05/29/2019 05/28/2019 11/08/2015  WBC 4.0 - 10.5 K/uL 6.5 9.8 6.2  Hemoglobin 13.0 - 17.0 g/dL 12.1(L) 14.1 11.7(L)  Hematocrit 39.0 - 52.0 % 37.1(L) 42.4 34.0(L)  Platelets 150 - 400 K/uL 186 221 196   Pedro Marchio, MD [place], PCCM Cell: [phone]

## 2019-05-31 NOTE — Care Management Important Message (Signed)
Important Message  Patient Details IM Letter given to Roque Lias SW Case Manager to present to the Patient Name: [name] MRN: [id number] Date of Birth: 06/18/1940   Medicare Important Message Given:  Yes     Kerin Salen 05/31/2019, 10:52 AM

## 2019-06-01 ENCOUNTER — Inpatient Hospital Stay (HOSPITAL_COMMUNITY): Payer: Medicare HMO

## 2019-06-01 MED ORDER — ALUM & MAG HYDROXIDE-SIMETH 200-200-20 MG/5ML PO SUSP
15.0000 mL | Freq: Four times a day (QID) | ORAL | Status: DC | PRN
  Administered 2019-06-01: 23:00:00 15 mL via ORAL
  Filled 2019-06-01: qty 30

## 2019-06-01 NOTE — Progress Notes (Signed)
Chest x-ray reviewed  Stable pneumothorax  We will repeat chest x-ray in a.m. and if no increase in pneumothorax, will plan for discharge 06/02/2019

## 2019-06-01 NOTE — Progress Notes (Signed)
Chest tube was successfully removed at bedside  Tolerated well  Will obtain chest x-ray this afternoon a noon  If stable, will plan to discharge

## 2019-06-01 NOTE — Progress Notes (Signed)
NAME:  [name], MRN:  [id number], DOB:  05/05/1940, LOS: 4 ADMISSION DATE:  05/28/2019, CONSULTATION DATE:  05/28/2019 REFERRING MD:  Rona Ravens, ER, CHIEF COMPLAINT:  Short of breath   Brief History   79 yo male with hx of RA on MTX presented with 2 days of cough and syncope.  Found to have Rt spontaneous pneumothorax and had chest tube placed in ER.  PCCM asked to admit.  Chest tube clamped for the last 18 hours Past Medical History  Esophageal stricture s/p dilation 2020, Gastroparesis, HTN, Chronic cough, RA  Significant Hospital Events   1/29 Admit 1/31 change chest tube to water seal 2/1 chest tube clamped 2/2 stable pneumothorax on chest x-ray Consults:    Procedures:  Rt chest tube 1/29 >>   Significant Diagnostic Tests:  CT chest 1/29 >> atherosclerosis, small HH, mod/severe paraseptal emphysema, subpleural reticulation and mild architectural distortion b/l, areas of GGO b/l  Chest x-ray  Micro Data:  SARS CoV2 Ag 1/29 >> negative SARS CoV2 TAT 1/29 >> negative RVP 1/29 >> negative Pneumococcal Ag 1/29 >> negative Legionella Ag 1/29 >> negative  Antimicrobials:  Rocephin 1/29 >> 1/30  Zithromax 1/29 >> 1/30  Interim history/subjective:  Denies any significant symptoms  Objective   Blood pressure 137/71, pulse 70, temperature 98.1 F (36.7 C), resp. rate 20, height 6\' 1"  (1.854 m), weight 94.8 kg, SpO2 96 %.        Intake/Output Summary (Last 24 hours) at 06/01/2019 [id number] Last data filed at 06/01/2019 0544 Gross per 24 hour  Intake 1123 ml  Output 1180 ml  Net -57 ml   Filed Weights   05/29/19 0401 05/30/19 0552 05/31/19 0500  Weight: 96.9 kg 96.1 kg 94.8 kg    Examination:  General -alert and oriented  HEENT- pupils reactive, moist oral mucosa Cardiac -S1-S2 appreciated Chest -clear breath sounds  abdomen -soft, bowel sounds appreciated Extremities - no cyanosis, clubbing, or edema  CXR (reviewed by me) -stable pneumothorax, ILD changes No  change in size of pneumothorax with chest tube clamping    Resolved Hospital Problem list   Community acquired pneumonia ruled out  Assessment & Plan:   Spontaneous Rt tension pneumothorax. -Interstitial lung disease in the setting of rheumatoid arthritis -Chest x-ray has remained stable -Plan is to remove chest tube today   Hx of HTN. -Continue Norvasc  Hx of hiatal hernia, reflux. -+ Continue Protonix  Hx of rheumatoid arthritis. -To resume methotrexate as outpatient   Best practice:  Diet: regular DVT prophylaxis: SQ heparin GI prophylaxis: protonix Mobility: OOB to chair Code Status: full code Disposition: floor bed  Labs    CMP Latest Ref Rng & Units 05/30/2019 05/29/2019 05/28/2019  Glucose 70 - 99 mg/dL 101(H) 127(H) 120(H)  BUN 8 - 23 mg/dL 18 21 22   Creatinine 0.61 - 1.24 mg/dL 1.12 1.15 1.25(H)  Sodium 135 - 145 mmol/L 137 138 138  Potassium 3.5 - 5.1 mmol/L 4.3 3.4(L) 3.8  Chloride 98 - 111 mmol/L 106 103 104  CO2 22 - 32 mmol/L 22 24 24   Calcium 8.9 - 10.3 mg/dL 8.8(L) 9.0 9.3  Total Protein 6.5 - 8.1 g/dL - - 8.1  Total Bilirubin 0.3 - 1.2 mg/dL - - 0.8  Alkaline Phos 38 - 126 U/L - - 41  AST 15 - 41 U/L - - 23  ALT 0 - 44 U/L - - 18    CBC Latest Ref Rng & Units 05/29/2019 05/28/2019 11/08/2015  WBC 4.0 -  10.5 K/uL 6.5 9.8 6.2  Hemoglobin 13.0 - 17.0 g/dL 12.1(L) 14.1 11.7(L)  Hematocrit 39.0 - 52.0 % 37.1(L) 42.4 34.0(L)  Platelets 150 - 400 K/uL 186 221 196   Chozen Latulippe, MD [place], PCCM Cell: [phone]

## 2019-06-02 ENCOUNTER — Inpatient Hospital Stay (HOSPITAL_COMMUNITY): Payer: Medicare HMO

## 2019-06-02 DIAGNOSIS — J849 Interstitial pulmonary disease, unspecified: Secondary | ICD-10-CM

## 2019-06-02 LAB — CULTURE, BLOOD (ROUTINE X 2)
Culture: NO GROWTH
Culture: NO GROWTH

## 2019-06-02 MED ORDER — LIDOCAINE HCL 2 % IJ SOLN
INTRAMUSCULAR | Status: AC
  Filled 2019-06-02: qty 20

## 2019-06-02 MED ORDER — FENTANYL CITRATE (PF) 100 MCG/2ML IJ SOLN
50.0000 ug | Freq: Once | INTRAMUSCULAR | Status: AC
  Administered 2019-06-02: 10:00:00 50 ug via INTRAVENOUS
  Filled 2019-06-02: qty 2

## 2019-06-02 NOTE — Progress Notes (Signed)
NAME:  [name], MRN:  [id number], DOB:  [birth year], LOS: 5 ADMISSION DATE:  05/28/2019, CONSULTATION DATE:  05/28/2019 REFERRING MD:  Rona Ravens, ER, CHIEF COMPLAINT:  Short of breath   Brief History   79 yo male with hx of RA on MTX presented with 2 days of cough and syncope.  Found to have Rt spontaneous pneumothorax and had chest tube placed in ER.  PCCM asked to admit.  Chest tube was clamped for over 18 hours, removed on 06/01/2019 Recurrence of pneumothorax on 06/02/2019  Past Medical History  Esophageal stricture s/p dilation 2020, Gastroparesis, HTN, Chronic cough, RA  Significant Hospital Events   1/29 Admit 1/31 change chest tube to water seal 2/1 chest tube clamped 2/2 stable pneumothorax on chest x-ray Consults:    Procedures:  Rt chest tube 1/29 >>   Significant Diagnostic Tests:  CT chest 1/29 >> atherosclerosis, small HH, mod/severe paraseptal emphysema, subpleural reticulation and mild architectural distortion b/l, areas of GGO b/l  Chest x-ray 06/02/2019 shows recurrence of pneumothorax  Micro Data:  SARS CoV2 Ag 1/29 >> negative SARS CoV2 TAT 1/29 >> negative RVP 1/29 >> negative Pneumococcal Ag 1/29 >> negative Legionella Ag 1/29 >> negative  Antimicrobials:  Rocephin 1/29 >> 1/30  Zithromax 1/29 >> 1/30  Interim history/subjective:  Denies any significant symptoms Denies shortness of breath, denies any chest pain or discomfort  Objective   Blood pressure 132/73, pulse 77, temperature 98.4 F (36.9 C), temperature source Oral, resp. rate 16, height 6\' 1"  (1.854 m), weight 93.2 kg, SpO2 96 %.        Intake/Output Summary (Last 24 hours) at 06/02/2019 [id number] Last data filed at 06/02/2019 0500 Gross per 24 hour  Intake 827 ml  Output 1575 ml  Net -748 ml   Filed Weights   05/30/19 0552 05/31/19 0500 06/02/19 0500  Weight: 96.1 kg 94.8 kg 93.2 kg    Examination:  General -alert and oriented  HEENT-pupils reactive, moist oral mucosa Cardiac -S1-S2  appreciated Chest -decreased breath sounds on the right abdomen -bowel sounds appreciated, soft abdomen Extremities - no cyanosis, clubbing, or edema  CXR (reviewed by me) -recurrence of pneumothorax following chest tube removal    Resolved Hospital Problem list   Community acquired pneumonia ruled out  Assessment & Plan:   Spontaneous right tension pneumothorax -Interstitial lung disease in the setting of rheumatoid arthritis -Chest x-ray did reveal recurrence of pneumothorax following chest tube removal -Will require replacement of chest tube -Will place a one-way valve following stabilization and he may be discharged with a one-way valve to be followed up as outpatient  Hypertension -Continue Norvasc  History of rheumatoid arthritis -To resume methotrexate as outpatient   Best practice:  Diet: regular DVT prophylaxis: SQ heparin GI prophylaxis: protonix Mobility: OOB to chair Code Status: full code Disposition: MedSurg  Labs    CMP Latest Ref Rng & Units 05/30/2019 05/29/2019 05/28/2019  Glucose 70 - 99 mg/dL 101(H) 127(H) 120(H)  BUN 8 - 23 mg/dL 18 21 22   Creatinine 0.61 - 1.24 mg/dL 1.12 1.15 1.25(H)  Sodium 135 - 145 mmol/L 137 138 138  Potassium 3.5 - 5.1 mmol/L 4.3 3.4(L) 3.8  Chloride 98 - 111 mmol/L 106 103 104  CO2 22 - 32 mmol/L 22 24 24   Calcium 8.9 - 10.3 mg/dL 8.8(L) 9.0 9.3  Total Protein 6.5 - 8.1 g/dL - - 8.1  Total Bilirubin 0.3 - 1.2 mg/dL - - 0.8  Alkaline Phos 38 - 126 U/L - -  41  AST 15 - 41 U/L - - 23  ALT 0 - 44 U/L - - 18    CBC Latest Ref Rng & Units 05/29/2019 05/28/2019 11/08/2015  WBC 4.0 - 10.5 K/uL 6.5 9.8 6.2  Hemoglobin 13.0 - 17.0 g/dL 12.1(L) 14.1 11.7(L)  Hematocrit 39.0 - 52.0 % 37.1(L) 42.4 34.0(L)  Platelets 150 - 400 K/uL 186 221 196   Crestina Strike, MD [place], PCCM Cell: [phone]

## 2019-06-02 NOTE — Progress Notes (Signed)
Chest x-ray with significant resolution of pneumothorax  Heimlich valve with a bag at the other and attached  Obtain chest x-ray in a.m.

## 2019-06-02 NOTE — Procedures (Signed)
Chest Tube Insertion Procedure Note  Indications:  Clinically significant Pneumothorax  Pre-operative Diagnosis: Pneumothorax  Post-operative Diagnosis: Pneumothorax  Procedure Details  Informed consent was obtained for the procedure, including sedation.  Risks of lung perforation, hemorrhage, arrhythmia, and adverse drug reaction were discussed.   After sterile skin prep, using standard technique, a 14 French tube was placed in the right lateral 5th rib space.  Findings: Air effluent  Estimated Blood Loss:  Minimal         Specimens:  None              Complications:  None; patient tolerated the procedure well.         Disposition: MedSurg floor         Condition: stable  Attending Attestation: I performed the procedure.

## 2019-06-03 ENCOUNTER — Inpatient Hospital Stay (HOSPITAL_COMMUNITY): Payer: Medicare HMO

## 2019-06-03 NOTE — Progress Notes (Signed)
Discharge instructions given with stated understanding 

## 2019-06-03 NOTE — Plan of Care (Signed)

## 2019-06-03 NOTE — Plan of Care (Signed)
  Problem: Education: Goal: Knowledge of General Education information will improve Description: Including pain rating scale, medication(s)/side effects and non-pharmacologic comfort measures 06/03/2019 1124 by Rance Muir, RN Outcome: Adequate for Discharge 06/03/2019 1104 by Rance Muir, RN Outcome: Progressing   Problem: Health Behavior/Discharge Planning: Goal: Ability to manage health-related needs will improve 06/03/2019 1124 by Rance Muir, RN Outcome: Adequate for Discharge 06/03/2019 1104 by Rance Muir, RN Outcome: Progressing   Problem: Clinical Measurements: Goal: Ability to maintain clinical measurements within normal limits will improve 06/03/2019 1124 by Rance Muir, RN Outcome: Adequate for Discharge 06/03/2019 1104 by Rance Muir, RN Outcome: Progressing Goal: Will remain free from infection 06/03/2019 1124 by Rance Muir, RN Outcome: Adequate for Discharge 06/03/2019 1104 by Rance Muir, RN Outcome: Progressing Goal: Diagnostic test results will improve 06/03/2019 1124 by Rance Muir, RN Outcome: Adequate for Discharge 06/03/2019 1104 by Rance Muir, RN Outcome: Progressing Goal: Respiratory complications will improve 06/03/2019 1124 by Rance Muir, RN Outcome: Adequate for Discharge 06/03/2019 1104 by Rance Muir, RN Outcome: Progressing Goal: Cardiovascular complication will be avoided 06/03/2019 1124 by Rance Muir, RN Outcome: Adequate for Discharge 06/03/2019 1104 by Rance Muir, RN Outcome: Progressing   Problem: Activity: Goal: Risk for activity intolerance will decrease 06/03/2019 1124 by Rance Muir, RN Outcome: Adequate for Discharge 06/03/2019 1104 by Rance Muir, RN Outcome: Progressing   Problem: Nutrition: Goal: Adequate nutrition will be maintained 06/03/2019 1124 by Rance Muir, RN Outcome: Adequate for Discharge 06/03/2019 1104 by Rance Muir, RN Outcome: Progressing   Problem: Coping: Goal: Level of anxiety will decrease 06/03/2019 1124 by Rance Muir, RN Outcome: Adequate for Discharge 06/03/2019  1104 by Rance Muir, RN Outcome: Progressing   Problem: Elimination: Goal: Will not experience complications related to bowel motility 06/03/2019 1124 by Rance Muir, RN Outcome: Adequate for Discharge 06/03/2019 1104 by Rance Muir, RN Outcome: Progressing Goal: Will not experience complications related to urinary retention 06/03/2019 1124 by Rance Muir, RN Outcome: Adequate for Discharge 06/03/2019 1104 by Rance Muir, RN Outcome: Progressing   Problem: Pain Managment: Goal: General experience of comfort will improve 06/03/2019 1124 by Rance Muir, RN Outcome: Adequate for Discharge 06/03/2019 1104 by Rance Muir, RN Outcome: Progressing   Problem: Safety: Goal: Ability to remain free from injury will improve 06/03/2019 1124 by Rance Muir, RN Outcome: Adequate for Discharge 06/03/2019 1104 by Rance Muir, RN Outcome: Progressing   Problem: Skin Integrity: Goal: Risk for impaired skin integrity will decrease 06/03/2019 1124 by Rance Muir, RN Outcome: Adequate for Discharge 06/03/2019 1104 by Rance Muir, RN Outcome: Progressing

## 2019-06-03 NOTE — Discharge Summary (Signed)
Physician Discharge Summary  Patient ID: [name] MRN: [id number] DOB/AGE: Mar 23, 1941 79 y.o.  Admit date: 05/28/2019 Discharge date: 06/03/2019    Discharge Diagnoses:  Secondary spontaneous pneumothorax Interstitial lung disease Rheumatoid arthritis GERD                                                  DISCHARGE PLAN BY DIAGNOSIS    Pneumothorax-chest tube with a one-way valve in place -Keep tube in place -Avoid getting site wet -Follow-up in the office at 1 week -We will plan to obtain chest x-ray and determine whether the chest tube can be removed at that time  Interstitial lung disease -Follow-up with pulmonary office in 1 week  Rheumatoid arthritis -Continue with methotrexate  GERD -Continue reflux medications  Discharge Plan: Discharge home with chest tube in place, keep tube clean, follow-up in 1 week for assessment with chest x-ray and determine whether chest tube can be removed at that time  DISCHARGE SUMMARY 79 year old gentleman was admitted with shortness of breath found to have a spontaneous pneumothorax Chest tube was placed with resolution of pneumothorax Chest tube was removed on 06/01/2019 but not to be replaced on 06/02/2019 secondary to a recurrence He has a chest tube in place with resolution of pneumothorax Will be going home with a one-way valve To be reevaluated in about a week with a chest x-ray to assess whether tube can be removed at that time  -Encouraged to keep site dry -Keep one-way valve in place at all times -Call with any significant concerns -Dressing on the site itself does not need to be changed until he is seen in the office           SIGNIFICANT DIAGNOSTIC STUDIES Chest x-ray with pneumothorax Chest x-ray 06/03/2019 shows resolution of pneumothorax   MICRO DATA:  None   ANTIBIOTICS None  CONSULTS None  TUBES / LINES Chest tube in place with one-way valve   Discharge Exam: General: Elderly gentleman, does not  appear to be in distress, comfortable Neuro: Alert and oriented x3, nonfocal exam CV: S1-S2 appreciated PULM: Clear breath sounds with rales at the bases GI:  Bowel sounds appreciated extremities: No clubbing, no edema   Vitals:   06/02/19 0520 06/02/19 1343 06/02/19 1948 06/03/19 0601  BP: 132/73 139/81 (!) 152/72 (!) 141/85  Pulse: 77 79 78 72  Resp: '16 18 18 18  ' Temp: 98.4 F (36.9 C) 98 F (36.7 C) 98.1 F (36.7 C) 98 F (36.7 C)  TempSrc: Oral Oral Oral Oral  SpO2: 96% 95% 96% 97%  Weight:    93.3 kg  Height:         Discharge Labs  BMET Recent Labs  Lab 05/28/19 0750 05/28/19 0750 05/29/19 0151 05/30/19 0533  NA 138  --  138 137  K 3.8   < > 3.4* 4.3  CL 104  --  103 106  CO2 24  --  24 22  GLUCOSE 120*  --  127* 101*  BUN 22  --  21 18  CREATININE 1.25*  --  1.15 1.12  CALCIUM 9.3  --  9.0 8.8*  MG  --   --   --  2.0   < > = values in this interval not displayed.    CBC Recent Labs  Lab 05/28/19 0750 05/29/19 0151  HGB 14.1  12.1*  HCT 42.4 37.1*  WBC 9.8 6.5  PLT 221 186    Anti-Coagulation No results for input(s): INR in the last 168 hours.     Follow-up Information    [facility]. Schedule an appointment as soon as possible for a visit.   Specialty: Pulmonology Contact information: [place] [phone]           Allergies as of 06/03/2019   No Known Allergies     Medication List    STOP taking these medications   OMEGA 3 PO     TAKE these medications   amLODipine 10 MG tablet Commonly known as: NORVASC Take 1 tablet (10 mg total) by mouth daily.   cetirizine 10 MG tablet Commonly known as: ZYRTEC Take 10 mg by mouth daily as needed for allergies.   guaifenesin 100 MG/5ML syrup Commonly known as: ROBITUSSIN Take 200 mg by mouth 3 (three) times daily as needed for cough.   methotrexate 2.5 MG tablet Commonly known as: RHEUMATREX Take 10 mg by mouth  See admin instructions. Take 80m every Thursday and Friday   montelukast 10 MG tablet Commonly known as: SINGULAIR Take 10 mg by mouth at bedtime as needed (allergies/sleep).   multivitamin with minerals Tabs tablet Take 1 tablet by mouth daily.   Muscle Rub 10-15 % Crea Apply 1 application topically as needed for muscle pain (shoulder/neck).   omeprazole 20 MG capsule Commonly known as: PRILOSEC Take 1 capsule (20 mg total) by mouth daily. Patient needs office visit for further refills!!!   valsartan-hydrochlorothiazide 320-25 MG tablet Commonly known as: DIOVAN-HCT Take 1 tablet by mouth daily.         Disposition:   Discharged Condition: Pedro Standagehas met maximum benefit of inpatient care and is medically stable and cleared for discharge.  Patient is pending follow up as above.      Time spent on disposition:  35 Minutes.   Signed: ASherrilyn Rist MD [facility] [phone]

## 2019-06-10 ENCOUNTER — Inpatient Hospital Stay (HOSPITAL_COMMUNITY): Payer: Medicare HMO

## 2019-06-10 ENCOUNTER — Encounter: Payer: Self-pay | Admitting: Pulmonary Disease

## 2019-06-10 ENCOUNTER — Telehealth: Payer: Self-pay | Admitting: Pulmonary Disease

## 2019-06-10 ENCOUNTER — Inpatient Hospital Stay (HOSPITAL_COMMUNITY)
Admission: AD | Admit: 2019-06-10 | Discharge: 2019-06-18 | DRG: 164 | Disposition: A | Discharge status: Home / Self Care | Payer: Medicare HMO | Attending: Cardiothoracic Surgery | Admitting: Cardiothoracic Surgery

## 2019-06-10 ENCOUNTER — Ambulatory Visit: Payer: Medicare HMO | Admitting: Pulmonary Disease

## 2019-06-10 ENCOUNTER — Other Ambulatory Visit: Payer: Self-pay

## 2019-06-10 ENCOUNTER — Ambulatory Visit (INDEPENDENT_AMBULATORY_CARE_PROVIDER_SITE_OTHER): Payer: Medicare HMO

## 2019-06-10 VITALS — BP 112/58 | HR 93 | Temp 97.2°F | Ht 67.0 in | Wt 208.4 lb

## 2019-06-10 DIAGNOSIS — Z8249 Family history of ischemic heart disease and other diseases of the circulatory system: Secondary | ICD-10-CM | POA: Diagnosis not present

## 2019-06-10 DIAGNOSIS — Z79899 Other long term (current) drug therapy: Secondary | ICD-10-CM | POA: Diagnosis not present

## 2019-06-10 DIAGNOSIS — N179 Acute kidney failure, unspecified: Secondary | ICD-10-CM | POA: Diagnosis present

## 2019-06-10 DIAGNOSIS — Z9689 Presence of other specified functional implants: Secondary | ICD-10-CM

## 2019-06-10 DIAGNOSIS — N189 Chronic kidney disease, unspecified: Secondary | ICD-10-CM | POA: Diagnosis present

## 2019-06-10 DIAGNOSIS — I10 Essential (primary) hypertension: Secondary | ICD-10-CM | POA: Diagnosis not present

## 2019-06-10 DIAGNOSIS — J9311 Primary spontaneous pneumothorax: Secondary | ICD-10-CM | POA: Diagnosis not present

## 2019-06-10 DIAGNOSIS — Z419 Encounter for procedure for purposes other than remedying health state, unspecified: Secondary | ICD-10-CM

## 2019-06-10 DIAGNOSIS — J849 Interstitial pulmonary disease, unspecified: Secondary | ICD-10-CM | POA: Diagnosis present

## 2019-06-10 DIAGNOSIS — M0510 Rheumatoid lung disease with rheumatoid arthritis of unspecified site: Secondary | ICD-10-CM | POA: Diagnosis present

## 2019-06-10 DIAGNOSIS — D62 Acute posthemorrhagic anemia: Secondary | ICD-10-CM | POA: Diagnosis not present

## 2019-06-10 DIAGNOSIS — I459 Conduction disorder, unspecified: Secondary | ICD-10-CM | POA: Diagnosis present

## 2019-06-10 DIAGNOSIS — J9382 Other air leak: Secondary | ICD-10-CM | POA: Diagnosis not present

## 2019-06-10 DIAGNOSIS — R0602 Shortness of breath: Secondary | ICD-10-CM | POA: Diagnosis not present

## 2019-06-10 DIAGNOSIS — Z8701 Personal history of pneumonia (recurrent): Secondary | ICD-10-CM

## 2019-06-10 DIAGNOSIS — I5021 Acute systolic (congestive) heart failure: Secondary | ICD-10-CM | POA: Diagnosis not present

## 2019-06-10 DIAGNOSIS — K219 Gastro-esophageal reflux disease without esophagitis: Secondary | ICD-10-CM | POA: Diagnosis not present

## 2019-06-10 DIAGNOSIS — Z20822 Contact with and (suspected) exposure to covid-19: Secondary | ICD-10-CM | POA: Diagnosis present

## 2019-06-10 DIAGNOSIS — I129 Hypertensive chronic kidney disease with stage 1 through stage 4 chronic kidney disease, or unspecified chronic kidney disease: Secondary | ICD-10-CM | POA: Diagnosis present

## 2019-06-10 DIAGNOSIS — J9690 Respiratory failure, unspecified, unspecified whether with hypoxia or hypercapnia: Secondary | ICD-10-CM | POA: Diagnosis not present

## 2019-06-10 DIAGNOSIS — Z9889 Other specified postprocedural states: Secondary | ICD-10-CM

## 2019-06-10 DIAGNOSIS — Z4682 Encounter for fitting and adjustment of non-vascular catheter: Secondary | ICD-10-CM | POA: Diagnosis not present

## 2019-06-10 DIAGNOSIS — M069 Rheumatoid arthritis, unspecified: Secondary | ICD-10-CM | POA: Diagnosis not present

## 2019-06-10 DIAGNOSIS — J939 Pneumothorax, unspecified: Secondary | ICD-10-CM

## 2019-06-10 DIAGNOSIS — J984 Other disorders of lung: Secondary | ICD-10-CM | POA: Diagnosis not present

## 2019-06-10 DIAGNOSIS — J9383 Other pneumothorax: Principal | ICD-10-CM | POA: Diagnosis present

## 2019-06-10 DIAGNOSIS — Z86010 Personal history of colonic polyps: Secondary | ICD-10-CM | POA: Diagnosis not present

## 2019-06-10 DIAGNOSIS — J439 Emphysema, unspecified: Secondary | ICD-10-CM | POA: Diagnosis not present

## 2019-06-10 DIAGNOSIS — Z01810 Encounter for preprocedural cardiovascular examination: Secondary | ICD-10-CM

## 2019-06-10 DIAGNOSIS — R079 Chest pain, unspecified: Secondary | ICD-10-CM | POA: Diagnosis not present

## 2019-06-10 LAB — CBC WITH DIFFERENTIAL/PLATELET
Abs Immature Granulocytes: 0.05 10*3/uL (ref 0.00–0.07)
Basophils Absolute: 0 10*3/uL (ref 0.0–0.1)
Basophils Relative: 1 %
Eosinophils Absolute: 0.6 10*3/uL — ABNORMAL HIGH (ref 0.0–0.5)
Eosinophils Relative: 9 %
HCT: 38.7 % — ABNORMAL LOW (ref 39.0–52.0)
Hemoglobin: 12.8 g/dL — ABNORMAL LOW (ref 13.0–17.0)
Immature Granulocytes: 1 %
Lymphocytes Relative: 13 %
Lymphs Abs: 0.9 10*3/uL (ref 0.7–4.0)
MCH: 31.4 pg (ref 26.0–34.0)
MCHC: 33.1 g/dL (ref 30.0–36.0)
MCV: 94.9 fL (ref 80.0–100.0)
Monocytes Absolute: 0.7 10*3/uL (ref 0.1–1.0)
Monocytes Relative: 11 %
Neutro Abs: 4.5 10*3/uL (ref 1.7–7.7)
Neutrophils Relative %: 65 %
Platelets: 284 10*3/uL (ref 150–400)
RBC: 4.08 MIL/uL — ABNORMAL LOW (ref 4.22–5.81)
RDW: 15 % (ref 11.5–15.5)
WBC: 6.8 10*3/uL (ref 4.0–10.5)
nRBC: 0 % (ref 0.0–0.2)

## 2019-06-10 LAB — BASIC METABOLIC PANEL
Anion gap: 9 (ref 5–15)
BUN: 23 mg/dL (ref 8–23)
CO2: 25 mmol/L (ref 22–32)
Calcium: 9.3 mg/dL (ref 8.9–10.3)
Chloride: 103 mmol/L (ref 98–111)
Creatinine, Ser: 1.54 mg/dL — ABNORMAL HIGH (ref 0.61–1.24)
GFR calc Af Amer: 49 mL/min — ABNORMAL LOW (ref >60)
GFR calc non Af Amer: 42 mL/min — ABNORMAL LOW (ref >60)
Glucose, Bld: 100 mg/dL — ABNORMAL HIGH (ref 70–99)
Potassium: 4.7 mmol/L (ref 3.5–5.1)
Sodium: 137 mmol/L (ref 135–145)

## 2019-06-10 LAB — SARS CORONAVIRUS 2 (TAT 6-24 HRS): SARS Coronavirus 2: NEGATIVE

## 2019-06-10 MED ORDER — HYDROCHLOROTHIAZIDE 25 MG PO TABS
25.0000 mg | ORAL_TABLET | Freq: Every day | ORAL | Status: DC
  Administered 2019-06-11 – 2019-06-12 (×2): 25 mg via ORAL
  Filled 2019-06-10 (×2): qty 1

## 2019-06-10 MED ORDER — PANTOPRAZOLE SODIUM 40 MG PO TBEC
40.0000 mg | DELAYED_RELEASE_TABLET | Freq: Every day | ORAL | Status: DC
  Administered 2019-06-11 – 2019-06-13 (×3): 40 mg via ORAL
  Filled 2019-06-10 (×3): qty 1

## 2019-06-10 MED ORDER — HEPARIN SODIUM (PORCINE) 5000 UNIT/ML IJ SOLN
5000.0000 [IU] | Freq: Three times a day (TID) | INTRAMUSCULAR | Status: DC
  Administered 2019-06-10 – 2019-06-14 (×11): 5000 [IU] via SUBCUTANEOUS
  Filled 2019-06-10 (×11): qty 1

## 2019-06-10 MED ORDER — ASPIRIN EC 81 MG PO TBEC
81.0000 mg | DELAYED_RELEASE_TABLET | Freq: Every day | ORAL | Status: DC
  Administered 2019-06-11 – 2019-06-13 (×3): 81 mg via ORAL
  Filled 2019-06-10 (×3): qty 1

## 2019-06-10 MED ORDER — IRBESARTAN 150 MG PO TABS
300.0000 mg | ORAL_TABLET | Freq: Every day | ORAL | Status: DC
  Administered 2019-06-11 – 2019-06-13 (×3): 300 mg via ORAL
  Filled 2019-06-10 (×3): qty 1

## 2019-06-10 MED ORDER — AMLODIPINE BESYLATE 10 MG PO TABS
10.0000 mg | ORAL_TABLET | Freq: Every day | ORAL | Status: DC
  Administered 2019-06-11 – 2019-06-13 (×3): 10 mg via ORAL
  Filled 2019-06-10 (×3): qty 1

## 2019-06-10 MED ORDER — VALSARTAN-HYDROCHLOROTHIAZIDE 320-25 MG PO TABS: 1.0000 | ORAL_TABLET | Freq: Every day | ORAL | Status: DC

## 2019-06-10 MED ORDER — METHOTREXATE 2.5 MG PO TABS
10.0000 mg | ORAL_TABLET | ORAL | Status: DC
  Administered 2019-06-11: 10:00:00 10 mg via ORAL
  Filled 2019-06-10: qty 4

## 2019-06-10 MED ORDER — MONTELUKAST SODIUM 10 MG PO TABS: 10.0000 mg | ORAL_TABLET | Freq: Every evening | ORAL | Status: DC | PRN

## 2019-06-10 MED ORDER — LORATADINE 10 MG PO TABS
10.0000 mg | ORAL_TABLET | Freq: Every day | ORAL | Status: DC
  Administered 2019-06-11 – 2019-06-13 (×3): 10 mg via ORAL
  Filled 2019-06-10 (×3): qty 1

## 2019-06-10 NOTE — Progress Notes (Signed)
[name]    [id number]    12/10/1940  Primary Care Physician:Bland, Myra Rude, MD  Referring Physician: [name], [address] [place],  [place] [address]  Chief complaint:   Follow-up for pneumothorax  HPI:  He was recently hospitalized with a pneumothorax Had a chest tube in place Was discharged home with a one-way valve  Comes in today for follow-up  Chest x-ray during the visit does reveal persistence of a pneumothorax  Tube remains patent  He does have a history of rheumatoid arthritis  Chest x-ray reviewed with the patient and his son during the visit Other family members were on the phone as well   Outpatient Encounter Medications as of 06/10/2019  Medication Sig  . amLODipine (NORVASC) 10 MG tablet Take 1 tablet (10 mg total) by mouth daily.  . cetirizine (ZYRTEC) 10 MG tablet Take 10 mg by mouth daily as needed for allergies.  Marland Kitchen guaifenesin (ROBITUSSIN) 100 MG/5ML syrup Take 200 mg by mouth 3 (three) times daily as needed for cough.  . Menthol-Methyl Salicylate (MUSCLE RUB) 10-15 % CREA Apply 1 application topically as needed for muscle pain (shoulder/neck).  . methotrexate (RHEUMATREX) 2.5 MG tablet Take 10 mg by mouth See admin instructions. Take 10mg  every Thursday and Friday  . montelukast (SINGULAIR) 10 MG tablet Take 10 mg by mouth at bedtime as needed (allergies/sleep).   . Multiple Vitamin (MULTIVITAMIN WITH MINERALS) TABS tablet Take 1 tablet by mouth daily.  Marland Kitchen omeprazole (PRILOSEC) 20 MG capsule Take 1 capsule (20 mg total) by mouth daily. Patient needs office visit for further refills!!!  . valsartan-hydrochlorothiazide (DIOVAN-HCT) 320-25 MG tablet Take 1 tablet by mouth daily.   No facility-administered encounter medications on file as of 06/10/2019.    Allergies as of 06/10/2019  . (No Known Allergies)    Past Medical History:  Diagnosis Date  . Adenomatous colon polyp   . Diverticulosis   . GERD (gastroesophageal  reflux disease)   . Hemorrhoids    internal and external  . Hiatal hernia   . Hypertension   . Rheumatoid arthritis ([place])   . S/P dilatation of esophageal stricture     Past Surgical History:  Procedure Laterality Date  . CIRCUMCISION    . HEMORRHOID SURGERY  2002    Family History  Problem Relation Age of Onset  . Heart disease Mother   . Heart disease Father   . Heart disease Brother   . Prostate cancer Paternal Uncle   . Colon cancer Neg Hx   . Throat cancer Neg Hx   . Diabetes Neg Hx   . Kidney disease Neg Hx   . Liver disease Neg Hx     Social History   Socioeconomic History  . Marital status: Married    Spouse name: Not on file  . Number of children: Not on file  . Years of education: Not on file  . Highest education level: Not on file  Occupational History  . Not on file  Tobacco Use  . Smoking status: Never Smoker  . Smokeless tobacco: Never Used  Substance and Sexual Activity  . Alcohol use: No  . Drug use: No  . Sexual activity: Not on file  Other Topics Concern  . Not on file  Social History Narrative  . Not on file   Social Determinants of Health   Financial Resource Strain:   . Difficulty of Paying Living Expenses: Not on file  Food Insecurity:   . Worried About Charity fundraiser in the Last Year: Not on file  . Ran Out of Food in the Last Year: Not on file  Transportation Needs:   . Lack of Transportation (Medical): Not on file  . Lack of Transportation (Non-Medical): Not on file  Physical Activity:   . Days of Exercise per Week: Not on file  . Minutes of Exercise per Session: Not on file  Stress:   . Feeling of Stress : Not on file  Social Connections:   . Frequency of Communication with Friends and Family: Not on file  . Frequency of Social Gatherings with Friends and Family: Not on file  . Attends Religious Services: Not on file  . Active Member of Clubs or Organizations: Not on file  . Attends Archivist Meetings: Not  on file  . Marital Status: Not on file  Intimate Partner Violence:   . Fear of Current or Ex-Partner: Not on file  . Emotionally Abused: Not on file  . Physically Abused: Not on file  . Sexually Abused: Not on file    Review of Systems  Vitals:   06/10/19 1221  BP: (!) 112/58  Pulse: 93  Temp: (!) 97.2 F (36.2 C)  SpO2: 95%     Physical Exam  Elderly gentleman does not appear to be in distress Slight decrease in air entry on the right No rales S1-S2 appreciated Abdomen is soft bowel sounds appreciated  Data Reviewed: Chest x-ray reviewed showing right sided pneumothorax  Assessment:  Persistent pneumothorax Pulmonary fibrosis Interstitial lung disease  Plan/Recommendations: Nonresolving pneumothorax We will arrange for admission to the hospital  Chest tube to suction  Consult cardiothoracic surgery for possible VATS pleurodesis  He appears to be in good health, stable state  We will give the son a call following been able to obtain a bed  The plan is to have him admitted today  Sherrilyn Rist MD [facility] 06/10/2019, 12:54 PM  CC: [name], MD

## 2019-06-10 NOTE — Patient Instructions (Signed)
Nonresolving pneumothorax Pulmonary fibrosis related to rheumatoid arthritis  We will arrange for admission  Chest tube to suction  Get cardiothoracic surgery involved for pleurodesis

## 2019-06-10 NOTE — Telephone Encounter (Signed)
Dr. Ander Slade, please advise on this for pt in regards to his second covid vaccine that he has scheduled already for 10am tomorrow if pt needs to get this rescheduled.

## 2019-06-10 NOTE — H&P (Addendum)
NAME:  [name], MRN:  [id number], DOB:  [birth year], LOS: 0 ADMISSION DATE:  (Not on file), History DATE: 06/10/2019 REFERRING MD:  , CHIEF COMPLAINT: Right sided pneumothorax  Brief History   Patient with a history of rheumatoid arthritis, interstitial lung disease Was recently hospitalized for pneumothorax He was discharged home with a one-way valve in place to follow-up in the office for possible discontinuation of tube Chest x-ray performed 06/10/2019 reveals persistence of right-sided pneumothorax with a functioning one-way valve in place CT reviewed showing multiple blebs Patient may benefit from VATS pleurodesis  Being admitted for management of persistent pneumothorax  Past Medical History   Past Medical History:  Diagnosis Date  . Adenomatous colon polyp   . Diverticulosis   . GERD (gastroesophageal reflux disease)   . Hemorrhoids    internal and external  . Hiatal hernia   . Hypertension   . Rheumatoid arthritis ([place])   . S/P dilatation of esophageal stricture      Significant Hospital Events     Consults:  Cardiothoracic surgery-consult needs placed  Procedures:  None  Significant Diagnostic Tests:  Chest x-ray 06/10/2019-persistence of right-sided pneumothorax  Micro Data:  None  Antimicrobials:  None  Interim history/subjective:  Denies any significant complaints Denies any chest pain or shortness of breath  Objective   There were no vitals taken for this visit.       No intake or output data in the 24 hours ending 06/10/19 1429 There were no vitals filed for this visit.  Examination: General: Elderly gentleman, does not appear to be in distress HENT: Moist oral mucosa Lungs: Decreased air entry on the right, no rales on left Cardiovascular: S1-S2 appreciated Abdomen: Soft, bowel sounds appreciated Extremities: No clubbing, no edema Neuro: Alert and oriented x3 GU:   Resolved Hospital Problem list     Assessment & Plan:    Right-sided pneumothorax -This is likely secondary to underlying interstitial lung disease secondary to rheumatoid arthritis -Chest tube in place -One-way valve on chest tube  Chest tube was in place from 05/28/2019-06/01/2019 Replaced 06/02/2019 Patient was discharged with a one-way valve on 06/03/2019 Follow-up in the office today 06/10/2019 with persistence of pneumothorax  Needs Pleur-evac suction  Cardiothoracic surgery consultation for possible VATS pleurodesis  History of chronic kidney disease -Obtain labs  Rheumatoid arthritis -Was on methotrexate  History of GERD -PPI  Was scheduled for second dose of Covid vaccine 06/11/2019 -Need to reschedule before discharge  Best practice:  Diet: Regular diet Pain/Anxiety/Delirium protocol (if indicated): None VAP protocol (if indicated): Not indicated DVT prophylaxis: Heparin GI prophylaxis: Protonix Glucose control: Monitor Mobility: As tolerated Code Status: Full code Family Communication: Spoke with family members in the office today Disposition:   Labs   CBC: No results for input(s): WBC, NEUTROABS, HGB, HCT, MCV, PLT in the last 168 hours.  Basic Metabolic Panel: No results for input(s): NA, K, CL, CO2, GLUCOSE, BUN, CREATININE, CALCIUM, MG, PHOS in the last 168 hours. GFR: Estimated Creatinine Clearance: 58.6 mL/min (by C-G formula based on SCr of 1.12 mg/dL). No results for input(s): PROCALCITON, WBC, LATICACIDVEN in the last 168 hours.  Liver Function Tests: No results for input(s): AST, ALT, ALKPHOS, BILITOT, PROT, ALBUMIN in the last 168 hours. No results for input(s): LIPASE, AMYLASE in the last 168 hours. No results for input(s): AMMONIA in the last 168 hours.  ABG No results found for: PHART, PCO2ART, PO2ART, HCO3, TCO2, ACIDBASEDEF, O2SAT   Coagulation Profile: No results for input(s):  INR, PROTIME in the last 168 hours.  Cardiac Enzymes: No results for input(s): CKTOTAL, CKMB, CKMBINDEX, TROPONINI in  the last 168 hours.  HbA1C: No results found for: HGBA1C  CBG: No results for input(s): GLUCAP in the last 168 hours.  Review of Systems:   Feels relatively well, denies any shortness of breath, no chest pain or chest discomfort  Past Medical History  He,  has a past medical history of Adenomatous colon polyp, Diverticulosis, GERD (gastroesophageal reflux disease), Hemorrhoids, Hiatal hernia, Hypertension, Rheumatoid arthritis ([place]), and S/P dilatation of esophageal stricture.   Surgical History    Past Surgical History:  Procedure Laterality Date  . CIRCUMCISION    . [place]  2002     Social History   reports that he has never smoked. He has never used smokeless tobacco. He reports that he does not drink alcohol or use drugs.   Family History   His family history includes Heart disease in his brother, father, and mother; Prostate cancer in his paternal uncle. There is no history of Colon cancer, Throat cancer, Diabetes, Kidney disease, or Liver disease.   Allergies No Known Allergies   Home Medications  Prior to Admission medications   Medication Sig Start Date End Date Taking? Authorizing Provider  amLODipine (NORVASC) 10 MG tablet Take 1 tablet (10 mg total) by mouth daily. 11/08/15   Thurnell Lose, MD  cetirizine (ZYRTEC) 10 MG tablet Take 10 mg by mouth daily as needed for allergies.    [provider]  guaifenesin (ROBITUSSIN) 100 MG/5ML syrup Take 200 mg by mouth 3 (three) times daily as needed for cough.    [provider]  Menthol-Methyl Salicylate (MUSCLE RUB) 10-15 % CREA Apply 1 application topically as needed for muscle pain (shoulder/neck).    [provider]  methotrexate (RHEUMATREX) 2.5 MG tablet Take 10 mg by mouth See admin instructions. Take 10mg  every Thursday and Friday    [provider]  montelukast (SINGULAIR) 10 MG tablet Take 10 mg by mouth at bedtime as needed (allergies/sleep).     [provider]  Multiple Vitamin (MULTIVITAMIN WITH MINERALS) TABS tablet Take 1 tablet by mouth daily.    [provider]  omeprazole (PRILOSEC) 20 MG capsule Take 1 capsule (20 mg total) by mouth daily. Patient needs office visit for further refills!!! 04/08/19   Irene Shipper, MD  valsartan-hydrochlorothiazide (DIOVAN-HCT) 320-25 MG tablet Take 1 tablet by mouth daily. 04/19/19   [provider]    Sherrilyn Rist, MD [place], [place] Cell: [phone]

## 2019-06-10 NOTE — Progress Notes (Signed)
PCCM Interval Progress Note  Pt arrived as direct admit from office.  Right chest tube connected to sahara and placed to 20cm suction. CXR ordered for now and in AM.  Montey Hora, PA [facility] 06/10/2019, 6:44 PM

## 2019-06-10 NOTE — Progress Notes (Signed)
Received patient coming from home, Dr. Ander Slade made aware.

## 2019-06-11 ENCOUNTER — Inpatient Hospital Stay (HOSPITAL_COMMUNITY): Payer: Medicare HMO

## 2019-06-11 DIAGNOSIS — J9311 Primary spontaneous pneumothorax: Secondary | ICD-10-CM

## 2019-06-11 DIAGNOSIS — I5021 Acute systolic (congestive) heart failure: Secondary | ICD-10-CM

## 2019-06-11 LAB — CBC
HCT: 37.4 % — ABNORMAL LOW (ref 39.0–52.0)
Hemoglobin: 12.5 g/dL — ABNORMAL LOW (ref 13.0–17.0)
MCH: 31.3 pg (ref 26.0–34.0)
MCHC: 33.4 g/dL (ref 30.0–36.0)
MCV: 93.5 fL (ref 80.0–100.0)
Platelets: 274 10*3/uL (ref 150–400)
RBC: 4 MIL/uL — ABNORMAL LOW (ref 4.22–5.81)
RDW: 15.1 % (ref 11.5–15.5)
WBC: 6.2 10*3/uL (ref 4.0–10.5)
nRBC: 0 % (ref 0.0–0.2)

## 2019-06-11 LAB — ECHOCARDIOGRAM COMPLETE
Height: 67 in
Weight: 3262.81 oz

## 2019-06-11 LAB — BLOOD GAS, ARTERIAL
Acid-Base Excess: 2.9 mmol/L — ABNORMAL HIGH (ref 0.0–2.0)
Bicarbonate: 26.7 mmol/L (ref 20.0–28.0)
Drawn by: 358491
FIO2: 21
O2 Saturation: 99 %
Patient temperature: 37
pCO2 arterial: 39.4 mmHg (ref 32.0–48.0)
pH, Arterial: 7.446 (ref 7.350–7.450)
pO2, Arterial: 140 mmHg — ABNORMAL HIGH (ref 83.0–108.0)

## 2019-06-11 LAB — BASIC METABOLIC PANEL
Anion gap: 9 (ref 5–15)
BUN: 23 mg/dL (ref 8–23)
CO2: 26 mmol/L (ref 22–32)
Calcium: 9 mg/dL (ref 8.9–10.3)
Chloride: 103 mmol/L (ref 98–111)
Creatinine, Ser: 1.58 mg/dL — ABNORMAL HIGH (ref 0.61–1.24)
GFR calc Af Amer: 48 mL/min — ABNORMAL LOW (ref >60)
GFR calc non Af Amer: 41 mL/min — ABNORMAL LOW (ref >60)
Glucose, Bld: 91 mg/dL (ref 70–99)
Potassium: 4.1 mmol/L (ref 3.5–5.1)
Sodium: 138 mmol/L (ref 135–145)

## 2019-06-11 LAB — PHOSPHORUS: Phosphorus: 4.6 mg/dL (ref 2.5–4.6)

## 2019-06-11 LAB — MAGNESIUM: Magnesium: 1.9 mg/dL (ref 1.7–2.4)

## 2019-06-11 NOTE — Progress Notes (Signed)
NAME:  [name], MRN:  [id number], DOB:  1940/11/27, LOS: 1 ADMISSION DATE:  06/10/2019, History DATE: 06/10/2019 REFERRING MD:  , CHIEF COMPLAINT: Right sided pneumothorax  Brief History   Patient with a history of rheumatoid arthritis, interstitial lung disease Was recently hospitalized for pneumothorax He was discharged home with a one-way valve in place to follow-up in the office for possible discontinuation of tube Chest x-ray performed 06/10/2019 reveals persistence of right-sided pneumothorax with a functioning one-way valve in place CT reviewed showing multiple blebs Patient may benefit from VATS pleurodesis  Being admitted for management of persistent pneumothorax  Past Medical History   Past Medical History:  Diagnosis Date  . Adenomatous colon polyp   . Diverticulosis   . GERD (gastroesophageal reflux disease)   . Hemorrhoids    internal and external  . Hiatal hernia   . Hypertension   . Rheumatoid arthritis ([place])   . S/P dilatation of esophageal stricture      Significant Hospital Events     Consults:  Cardiothoracic surgery-consult needs placed  Procedures:  None  Significant Diagnostic Tests:  Chest x-ray 06/10/2019-persistence of right-sided pneumothorax  Micro Data:  None  Antimicrobials:  None  Interim history/subjective:  S/p right chest tube placement. Denies shortness of breath or chest pain  Objective   Blood pressure 116/71, pulse 76, temperature 98.4 F (36.9 C), temperature source Oral, resp. rate 16, weight 92.5 kg, SpO2 99 %.        Intake/Output Summary (Last 24 hours) at 06/11/2019 0841 Last data filed at 06/11/2019 0815 Gross per 24 hour  Intake --  Output 800 ml  Net -800 ml   Filed Weights   06/11/19 0500  Weight: 92.5 kg   Physical Exam: General: Well-appearing, no acute distress HENT: [place], AT, OP clear, MMM Eyes: EOMI, no scleral icterus Respiratory: Clear to auscultation bilaterally.  No crackles, wheezing or  rales, right chest tube in place - no air leak Cardiovascular: RRR, -M/R/G, no JVD Extremities:-Edema,-tenderness Neuro: AAO x4, CNII-XII grossly intact Skin: Intact, no rashes or bruising Psych: Normal mood, normal affect  CXR 06/11/19 - Right chest tube in place. No pneumothorax.  Resolved Hospital Problem list     Assessment & Plan:  Right-sided pneumothorax -This is likely secondary to underlying interstitial lung disease secondary to rheumatoid arthritis -Chest tube was in place from 05/28/2019-06/01/2019 and patient discharged with one-way valve on 06/03/19. Office follow-up demonstrated recurrent moderate pneumothorax despite functioning one-way valve. He was readmitted on 2/11 and chest tube was replaced. Plan -Chest tube remains on suction -CXR daily -Cardiothoracic surgery consultation for possible VATS pleurodesis  History of chronic kidney disease -Intermittent labs  Rheumatoid arthritis -Was on methotrexate  History of GERD -PPI  Was scheduled for second dose of Covid vaccine 06/11/2019 -Need to reschedule before discharge  Best practice:  Diet: Regular diet Pain/Anxiety/Delirium protocol (if indicated): None VAP protocol (if indicated): Not indicated DVT prophylaxis: Heparin GI prophylaxis: Protonix Glucose control: Monitor Mobility: As tolerated Code Status: Full code Family Communication: Updated patient at bedside Disposition: Remain on floor  Labs   CBC: Recent Labs  Lab 06/10/19 1810 06/11/19 0413  WBC 6.8 6.2  NEUTROABS 4.5  --   HGB 12.8* 12.5*  HCT 38.7* 37.4*  MCV 94.9 93.5  PLT 284 [id number]    Basic Metabolic Panel: Recent Labs  Lab 06/10/19 1810 06/11/19 0413  NA 137 138  K 4.7 4.1  CL 103 103  CO2 25 26  GLUCOSE 100* 91  BUN 23 23  CREATININE 1.54* 1.58*  CALCIUM 9.3 9.0  MG  --  1.9  PHOS  --  4.6   Rodman Pickle, M.D. [facility] 06/11/2019 8:41 AM

## 2019-06-11 NOTE — Progress Notes (Signed)
Lauren in Pharmacy called back and they will keep working on a way to possibly have a dose of the Covid vaccine for pt today but for right now, there is no way to have a dose, pt informed.

## 2019-06-11 NOTE — Progress Notes (Signed)
  Echocardiogram 2D Echocardiogram has been performed.  [name] 06/11/2019, 1:52 PM

## 2019-06-11 NOTE — Progress Notes (Signed)
Pt states he was supposed to get his second Covid vaccine today at the [facility].  Called Pharmacy to see if there was any way we could give him one here.They will check and call me back.

## 2019-06-11 NOTE — Consult Note (Addendum)
[address]       [place] [address]             [phone]        Errol Oceguera [place] Medical Record [id number] Date of Birth: 05/08/1940  Referring: Sherrilyn Rist, MD Primary Care: [name], MD Primary Cardiologist:No primary care provider on file.  Chief Complaint:  Shortness of breath, cough  History of Present Illness:      Mr. Scoggins is a 79 year old male patient who presented to the emergency department on 05/28/2019 with a chief complaint of shortness of breath.  The patient was on 5 L of nasal cannula oxygen support at the time of arrival and the patient stated he had a history of pneumonia.  He had no known Covid exposures and received his first dose of the code vaccine the week prior to arriving to the ED.  At the time of admission he had a nonproductive cough, associated chest discomfort, and shortness of breath.  He denied fever, nasal congestion, chest pain with the exception of coughing, nausea, vomiting, palpitations, or sick contacts.  A chest x-ray was obtained which showed a large right pneumothorax with a tension component.  Consolidation in the central portion of the collapsed lung.  The ED physician inserted a right small bore 14 inch French chest tube with partial reexpansion of the right lung.  There was a small air leak after insertion. The patient left the hospital six days later and returned after his primary care doctor advised him to go back to the hospital.  After serial chest x-rays, the pneumothorax eventually resolved.  He had a CT scan on 05/28/2019 which showed moderately advanced pulmonary emphysema. Multiple blebs on CT scan but never a smoker. We were consulted for a video-assisted thoracoscopic surgery with pleurodesis.   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  However, he has no  known exposures. He has rheumatoid arthritis and has taking methotrexate for over 20 years.    Current Activity/ Functional Status: Patient was independent with mobility/ambulation, transfers, ADL's, IADL's.   Zubrod Score: At the time of surgery this patient's most appropriate activity status/level should be described as: []     0    Normal activity, no symptoms [x]     1    Restricted in physical strenuous activity but ambulatory, able to do out light work []     2    Ambulatory and capable of self care, unable to do work activities, up and about                 more than 50%  Of the time                            []     3    Only limited self care, in bed greater than 50% of waking hours []     4    Completely disabled, no self care, confined to bed or chair []     5    Moribund  Past Medical History:  Diagnosis Date  . Adenomatous colon polyp   . Diverticulosis   . GERD (gastroesophageal reflux disease)   . Hemorrhoids    internal and external  . Hiatal hernia   . Hypertension   . Rheumatoid  arthritis ([place])   . S/P dilatation of esophageal stricture     Past Surgical History:  Procedure Laterality Date  . CIRCUMCISION    . HEMORRHOID SURGERY  2002    Social History   Tobacco Use  Smoking Status Never Smoker  Smokeless Tobacco Never Used    Social History   Substance and Sexual Activity  Alcohol Use No     No Known Allergies  Current Facility-Administered Medications  Medication Dose Route Frequency Provider Last Rate Last Admin  . amLODipine (NORVASC) tablet 10 mg  10 mg Oral Daily Desai, Rahul P, PA-C      . aspirin EC tablet 81 mg  81 mg Oral Daily Desai, Rahul P, PA-C      . heparin injection 5,000 Units  5,000 Units Subcutaneous Q8H Desai, Rahul P, PA-C   5,000 Units at 06/11/19 0523  . irbesartan (AVAPRO) tablet 300 mg  300 mg Oral Daily Desai, Rahul P, PA-C       And  . hydrochlorothiazide (HYDRODIURIL) tablet 25 mg  25 mg Oral Daily Desai, Rahul P, PA-C       . loratadine (CLARITIN) tablet 10 mg  10 mg Oral Daily Desai, Rahul P, PA-C      . methotrexate (RHEUMATREX) tablet 10 mg  10 mg Oral Once per day on Thu Fri Desai, Rahul P, PA-C      . montelukast (SINGULAIR) tablet 10 mg  10 mg Oral QHS PRN Desai, Rahul P, PA-C      . pantoprazole (PROTONIX) EC tablet 40 mg  40 mg Oral Daily Desai, Rahul P, PA-C        Medications Prior to Admission  Medication Sig Dispense Refill Last Dose  . amLODipine (NORVASC) 10 MG tablet Take 1 tablet (10 mg total) by mouth daily. 30 tablet 0 06/10/2019 at Unknown time  . aspirin EC 81 MG tablet Take 81 mg by mouth daily.   06/10/2019 at Unknown time  . cetirizine (ZYRTEC) 10 MG tablet Take 10 mg by mouth daily as needed for allergies.   Past Week at Unknown time  . Menthol-Methyl Salicylate (MUSCLE RUB) 10-15 % CREA Apply 1 application topically as needed for muscle pain (shoulder/neck).   Past Week at Unknown time  . methotrexate (RHEUMATREX) 2.5 MG tablet Take 10 mg by mouth See admin instructions. Take 10mg  every Thursday and Friday   06/10/2019 at Unknown time  . montelukast (SINGULAIR) 10 MG tablet Take 10 mg by mouth at bedtime as needed (allergies/sleep).    Past Week at Unknown time  . omeprazole (PRILOSEC) 20 MG capsule Take 1 capsule (20 mg total) by mouth daily. Patient needs office visit for further refills!!! 90 capsule 0 06/10/2019 at Unknown time  . valsartan-hydrochlorothiazide (DIOVAN-HCT) 320-25 MG tablet Take 1 tablet by mouth daily.   06/10/2019 at Unknown time    Family History  Problem Relation Age of Onset  . Heart disease Mother   . Heart disease Father   . Heart disease Brother   . Prostate cancer Paternal Uncle   . Colon cancer Neg Hx   . Throat cancer Neg Hx   . Diabetes Neg Hx   . Kidney disease Neg Hx   . Liver disease Neg Hx      Review of Systems:   Review of Systems  Constitutional: Negative for chills and fever.  Respiratory: Positive for cough and shortness of breath.     Cardiovascular: Positive for chest pain and leg swelling.  Gastrointestinal: Negative  for abdominal pain, heartburn, nausea and vomiting.  Musculoskeletal: Positive for joint pain (rheumatoid arthritis).  Psychiatric/Behavioral: Negative for substance abuse.   Pertinent items are noted in HPI.    Physical Exam: BP 116/71 (BP Location: Left Arm)   Pulse 76   Temp 98.4 F (36.9 C) (Oral)   Resp 16   Wt 92.5 kg   SpO2 99%   BMI 31.94 kg/m    General appearance: alert, cooperative and no distress Resp: clear to auscultation bilaterally Cardio: regular rate and rhythm, S1, S2 normal, no murmur, click, rub or gallop GI: soft, non-tender; bowel sounds normal; no masses,  no organomegaly Extremities: extremities normal, atraumatic, no cyanosis or edema Neurologic: Grossly normal  Diagnostic Studies & Laboratory data:   CLINICAL DATA:  79 year old male with cough and right-sided pneumothorax.  EXAM: CT CHEST WITHOUT CONTRAST  TECHNIQUE: Multidetector CT imaging of the chest was performed following the standard protocol without IV contrast.  COMPARISON:  Chest x-ray obtained earlier today  FINDINGS: Cardiovascular: Limited evaluation in the absence of intravenous contrast. Two vessel aortic arch. The right brachiocephalic and left common carotid artery share a common origin. The aorta is normal in size. Calcifications are present throughout the aorta. The heart is normal in size. Calcifications are present along the coronary arteries. No pericardial effusion. Unremarkable pulmonary arteries.  Mediastinum/Nodes: Unremarkable appearance of the thyroid gland. Right paratracheal lymph node measures 1.1 cm in short axis on image 40 of series 2. Nonspecific prevascular station lymph node measuring 1.1 cm in diameter on image 52 of series 2. This is not considered enlarged by CT imaging. Small hiatal hernia present. Otherwise, unremarkable appearance of the thoracic  esophagus.  Lungs/Pleura: Moderately advanced predominantly paraseptal pulmonary emphysema. Additionally, there are areas of subpleural reticulation and mild architectural distortion bilaterally. Trace (less than 5%) residual pneumothorax medially and anteriorly in the right pleural space. The percutaneous thoracostomy tube remains in good position positioned anterior to the right upper lobe. Trace right pleural effusion. Nonspecific areas of ground-glass attenuation airspace opacity bilaterally are nonspecific. No suspicious pulmonary mass or nodule.  Upper Abdomen: No acute abnormality.  Musculoskeletal: No acute osseous abnormality. Flowing anterior osteophytes in the midthoracic spine suggests underlying dystrophic idiopathic skeletal hyperostosis.  IMPRESSION: 1. Advanced paraseptal pulmonary emphysema. 2. Trace residual right-sided pneumothorax accounting for less than 5% of total lung volume. 3. Well-positioned pigtail thoracostomy tube. 4. Mild subpleural reticulation, architectural distortion and scattered areas of nonspecific ground-glass attenuation airspace opacity. Findings may reflect sequelae of chronic pulmonary parenchymal changes including interstitial lung disease. 5. Small hiatal hernia. 6. Aortic atherosclerosis. 7. Probable dystrophic idiopathic skeletal hyperostosis.  Aortic Atherosclerosis (ICD10-I70.0) and Emphysema (ICD10-J43.9).   Electronically Signed   By: Jacqulynn Cadet M.D.   On: 05/28/2019 12:17     Recent Radiology Findings:   DG Chest 2 View  Result Date: 06/10/2019 CLINICAL DATA:  Pneumothorax. EXAM: CHEST - 2 VIEW COMPARISON:  June 03, 2019. FINDINGS: The heart size and mediastinal contours are within normal limits. Right-sided chest tube is again noted. Moderate right pneumothorax is now visualized. Stable bilateral interstitial densities are noted consistent with scarring. The visualized skeletal structures are unremarkable.  IMPRESSION: Moderate right pneumothorax is now visualized. Right-sided chest tube is again noted. Stable bilateral interstitial densities are noted consistent with scarring. Electronically Signed   By: Marijo Conception M.D.   On: 06/10/2019 12:19   DG Chest Port 1 View  Result Date: 06/11/2019 CLINICAL DATA:  Respiratory failure. EXAM: PORTABLE CHEST 1 VIEW  COMPARISON:  June 10, 2019. FINDINGS: Stable cardiomediastinal silhouette. Right-sided chest tube is unchanged in position. No pneumothorax is noted. Stable interstitial densities are noted in both lung bases concerning for scarring or atelectasis. Bony thorax is unremarkable. IMPRESSION: Stable position of right-sided chest tube without pneumothorax. Stable bibasilar scarring or atelectasis. Electronically Signed   By: Marijo Conception M.D.   On: 06/11/2019 08:35   DG Chest Port 1 View  Result Date: 06/10/2019 CLINICAL DATA:  Right pneumothorax, chest tube in place. EXAM: PORTABLE CHEST 1 VIEW COMPARISON:  Radiograph earlier this day. CT 05/28/2019 FINDINGS: Right pigtail catheter slightly more peripheral than on prior exam. Decreased size of right pneumothorax with small to moderate residual, approximately 10%. Coarse reticular changes, basilar predominant, stable from prior exams. Unchanged heart size and mediastinal contours. No left pneumothorax or new airspace disease. IMPRESSION: Decreased size of right pneumothorax with small to moderate residual, approximately 10%. Right pigtail catheter remains in place, slightly more peripheral than on prior exam. Electronically Signed   By: Keith Rake M.D.   On: 06/10/2019 20:21    Chest Xray 06/11/2019   CLINICAL DATA:  Respiratory failure.  EXAM: PORTABLE CHEST 1 VIEW   COMPARISON:  June 10, 2019.  FINDINGS: Stable cardiomediastinal silhouette. Right-sided chest tube is unchanged in position. No pneumothorax is noted. Stable interstitial densities are noted in both lung bases  concerning for scarring or atelectasis. Bony thorax is unremarkable.  IMPRESSION: Stable position of right-sided chest tube without pneumothorax. Stable bibasilar scarring or atelectasis.   Electronically Signed   By: Marijo Conception M.D.   On: 06/11/2019 08:35   I have independently reviewed the above radiologic studies and discussed with the patient   Recent Lab Findings: Lab Results  Component Value Date   WBC 6.2 06/11/2019   HGB 12.5 (L) 06/11/2019   HCT 37.4 (L) 06/11/2019   PLT 274 06/11/2019   GLUCOSE 91 06/11/2019   ALT 18 05/28/2019   AST 23 05/28/2019   NA 138 06/11/2019   K 4.1 06/11/2019   CL 103 06/11/2019   CREATININE 1.58 (H) 06/11/2019   BUN 23 06/11/2019   CO2 26 06/11/2019      Assessment / Plan:      1. Pneumothorax- pigtail catheter placed which is almost out of the chest wall. No air leak. No pneumothorax on CXR. No recent CT scan but the one from 1/29 showed emphysema and multiple blebs. Has been on Singulair PRN for allergies.  2. Renal insufficiency-creatinine 1.58, continue to avoid nephrotoxic medications 3. Rheumatoid arthritis-has been on methotrexate for over 20 years.  4. Hypertension-on Norvasc and Avapro, well controlled 5. COVID negative. 1st dose of the vaccine 3 weeks ago and was due for his second dose today.   Plan: VATs with mechanical vs chemical pleurodesis and possible stapling of blebs discussed with the patient and the son at the bedside. All questions answered. Plan for surgery on Monday, 2/15 with Dr, Prescott Gum. Currently stable on room air, no air leak, and no pneumothorax on CXR today.      I  spent 40 minutes counseling the patient face to face.   Nicholes Rough, PA-C 06/11/2019 9:36 AM  Patient examined and recent chest CT scan and chest x-rays personally reviewed.  The patient was admitted with an indwelling pigtail catheter for first episode right spontaneous pneumothorax and history of bullous emphysema.  As an  outpatient the right pneumothorax apparently increased in size and he was admitted and placed  back on suction.  On suction the pneumothorax is trace and on exam there is no air leak. We will place the chest tube back to waterseal and follow-up with x-ray to determine if surgery is needed.  Plan of care discussed with patient and his son and they understand and agree.  Agree with above assessment and plan as outlined by Ms. Conte,PA-C

## 2019-06-12 ENCOUNTER — Inpatient Hospital Stay (HOSPITAL_COMMUNITY): Payer: Medicare HMO

## 2019-06-12 DIAGNOSIS — N179 Acute kidney failure, unspecified: Secondary | ICD-10-CM

## 2019-06-12 LAB — BASIC METABOLIC PANEL WITH GFR
Anion gap: 9 (ref 5–15)
BUN: 24 mg/dL — ABNORMAL HIGH (ref 8–23)
CO2: 24 mmol/L (ref 22–32)
Calcium: 9.3 mg/dL (ref 8.9–10.3)
Chloride: 101 mmol/L (ref 98–111)
Creatinine, Ser: 1.33 mg/dL — ABNORMAL HIGH (ref 0.61–1.24)
GFR calc Af Amer: 59 mL/min — ABNORMAL LOW
GFR calc non Af Amer: 50 mL/min — ABNORMAL LOW
Glucose, Bld: 104 mg/dL — ABNORMAL HIGH (ref 70–99)
Potassium: 4.5 mmol/L (ref 3.5–5.1)
Sodium: 134 mmol/L — ABNORMAL LOW (ref 135–145)

## 2019-06-12 MED ORDER — MAGNESIUM SULFATE 2 GM/50ML IV SOLN
2.0000 g | Freq: Once | INTRAVENOUS | Status: AC
  Administered 2019-06-12: 2 g via INTRAVENOUS
  Filled 2019-06-12: qty 50

## 2019-06-12 NOTE — Progress Notes (Signed)
NAME:  [name], MRN:  [id number], DOB:  [birth year], LOS: 2 ADMISSION DATE:  06/10/2019, History DATE: 06/10/2019 REFERRING MD:  , CHIEF COMPLAINT: Right sided pneumothorax  Brief History   Patient with a history of rheumatoid arthritis, interstitial lung disease Was recently hospitalized for pneumothorax He was discharged home with a one-way valve in place to follow-up in the office for possible discontinuation of tube Chest x-ray performed 06/10/2019 reveals persistence of right-sided pneumothorax with a functioning one-way valve in place CT reviewed showing multiple blebs Patient may benefit from VATS pleurodesis  Being admitted for management of persistent pneumothorax  Past Medical History   has a past medical history of Adenomatous colon polyp, Diverticulosis, GERD (gastroesophageal reflux disease), Hemorrhoids, Hiatal hernia, Hypertension, Rheumatoid arthritis ([place]), and S/P dilatation of esophageal stricture.   has a past surgical history that includes Hemorrhoid surgery (2002) and Circumcision.   reports that he has never smoked. He has never used smokeless tobacco.   Significant Hospital Events   06/10/2019 - events 2/12 - S/p right chest tube placement. Denies shortness of breath or chest pain  Consults:  Cardiothoracic surgery-consult needs placed  Procedures:  None  Significant Diagnostic Tests:  Chest x-ray 06/10/2019-persistence of right-sided pneumothorax  Micro Data:  None  Antimicrobials:  None  Interim history/subjective:   2/13- for VATS pleurodesis byt Dr Nils Pyle on Monday 06/14/2019. ECHO with LVEF 65%  Lying in bed. Denies complaints. RN wantts to know if he can ambulate and ok to dc tele - I said yes for both. He is wondring if VATS needed,.  Chest tube wit suction. Tidals +. No air leak. No Ptx on cxr   Objective   Blood pressure 130/64, pulse 71, temperature 97.7 F (36.5 C), temperature source Oral, resp. rate 16, weight 92.5 kg, SpO2 98  %.        Intake/Output Summary (Last 24 hours) at 06/12/2019 1352 Last data filed at 06/12/2019 [id number] Gross per 24 hour  Intake 120 ml  Output 709 ml  Net -589 ml   Filed Weights   06/11/19 0500  Weight: 92.5 kg   General Appearance:  Looks well Head:  Normocephalic, without obvious abnormality, atraumatic Eyes:  PERRL - yes, conjunctiva/corneas - clear     Ears:  Normal external ear canals, both ears Nose:  G tube - no Throat:  ETT TUBE - no , OG tube - no Neck:  Supple,  No enlargement/tenderness/nodules Lungs: Rt chest tube +,. Tidals +, No air leak =. But on suction. Bilateral bibasal crackles + Heart:  S1 and S2 normal, no murmur, CVP - no.  Pressors - no Abdomen:  Soft, no masses, no organomegaly Genitalia / Rectal:  Not done Extremities:  Extremities- intact Skin:  ntact in exposed areas . Sacral area - not examined Neurologic:  Sedation - none -> RASS - +1 . Moves all 4s - yes. CAM-ICU - neg . Orientation - x3+     .  Resolved Hospital Problem list     Assessment & Plan:  Right-sided pneumothorax -This is likely secondary to underlying interstitial lung disease secondary to rheumatoid arthritis -Chest tube was in place from 05/28/2019-06/01/2019 and patient discharged with one-way valve on 06/03/19. Office follow-up demonstrated recurrent moderate pneumothorax despite functioning one-way valve. He was readmitted on 2/11 and chest tube was replaced.  06/12/2019 - Chest tube in situ with good lung explansion. No bubbling. 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  Plan  - as opd will need to discuss anti-fibrotics (Dr Jenetta Downer or via ILD center - Dr Chase Caller); depending on course - Dr Jenetta Downer to decide   AKI on CKD (baseline 115mg % in Jan 2021)History of chronic kidney  disease  plan - recheck 2/13 tida and if up dc hctz   Rheumatoid arthritis -  on methotrexate  History of GERD -PPI  Was scheduled for second dose of Covid vaccine 06/11/2019 -Need to reschedule before discharge  Best practice:  Diet: Regular diet Pain/Anxiety/Delirium protocol (if indicated): None VAP protocol (if indicated): Not indicated DVT prophylaxis: Heparin GI prophylaxis: Protonix Glucose control: Monitor Mobility: As tolerated Code Status: Full code Family Communication: patient Disposition: Remain on floor - ok to dc tele     SIGNATURE    Dr. Brand Males, M.D., F.C.C.P,  Pulmonary and Critical Care Medicine Staff Physician, [place] Director - Interstitial Lung Disease  Program  P[facility] [place], [place], [address]  Pager: [phone], If no answer or between  15:00h - 7:00h: call [phone] Telephone: [phone]  1:54 PM 06/12/2019      [place]  Lab 06/11/19 1238  PHART 7.446  PCO2ART 39.4  PO2ART 140*  HCO3 26.7  O2SAT 99.0    CBC Recent Labs  Lab 06/10/19 1810 06/11/19 0413  HGB 12.8* 12.5*  HCT 38.7* 37.4*  WBC 6.8 6.2  PLT 284 274    COAGULATION No results for input(s): INR in the last 168 hours.  CARDIAC  No results for input(s): TROPONINI in the last 168 hours. No results for input(s): PROBNP in the last 168 hours.   CHEMISTRY Recent Labs  Lab 06/10/19 1810 06/11/19 0413  NA 137 138  K 4.7 4.1  CL 103 103  CO2 25 26  GLUCOSE 100* 91  BUN 23 23  CREATININE 1.54* 1.58*  CALCIUM 9.3 9.0  MG  --  1.9  PHOS  --  4.6   Estimated Creatinine Clearance: 80 mL/min (A) (by C-G formula based on SCr of 1.58 mg/dL (H)).   LIVER No results for input(s): AST, ALT, ALKPHOS, BILITOT, PROT, ALBUMIN, INR in the last 168 hours.   INFECTIOUS No results for input(s): LATICACIDVEN, PROCALCITON in the last 168  hours.   ENDOCRINE CBG (last 3)  No results for input(s): GLUCAP in the last 72 hours.       IMAGING x48h  - image(s) personally visualized  -   highlighted in bold DG CHEST PORT 1 VIEW  Result Date: 06/12/2019 CLINICAL DATA:  Pneumothorax. EXAM: PORTABLE CHEST 1 VIEW COMPARISON:  06/11/2019 FINDINGS: The cardiomediastinal silhouette is unchanged with normal heart size. A right basilar chest tube remains in place. No pneumothorax or sizable pleural effusion is identified. Coarse interstitial markings throughout both lungs are unchanged without evidence of acute consolidative airspace disease or edema. IMPRESSION: Unchanged appearance of the chest.  No pneumothorax identified. Electronically Signed   By: Logan Bores M.D.   On: 06/12/2019 09:59   DG Chest Port 1 View  Result Date: 06/11/2019 CLINICAL DATA:  Respiratory failure. EXAM: PORTABLE CHEST 1 VIEW COMPARISON:  June 10, 2019. FINDINGS: Stable cardiomediastinal silhouette. Right-sided chest tube is unchanged in position. No pneumothorax is noted. Stable  interstitial densities are noted in both lung bases concerning for scarring or atelectasis. Bony thorax is unremarkable. IMPRESSION: Stable position of right-sided chest tube without pneumothorax. Stable bibasilar scarring or atelectasis. Electronically Signed   By: Marijo Conception M.D.   On: 06/11/2019 08:35   DG Chest Port 1 View  Result Date: 06/10/2019 CLINICAL DATA:  Right pneumothorax, chest tube in place. EXAM: PORTABLE CHEST 1 VIEW COMPARISON:  Radiograph earlier this day. CT 05/28/2019 FINDINGS: Right pigtail catheter slightly more peripheral than on prior exam. Decreased size of right pneumothorax with small to moderate residual, approximately 10%. Coarse reticular changes, basilar predominant, stable from prior exams. Unchanged heart size and mediastinal contours. No left pneumothorax or new airspace disease. IMPRESSION: Decreased size of right pneumothorax with small to  moderate residual, approximately 10%. Right pigtail catheter remains in place, slightly more peripheral than on prior exam. Electronically Signed   By: Keith Rake M.D.   On: 06/10/2019 20:21   ECHOCARDIOGRAM COMPLETE  Result Date: 06/11/2019    ECHOCARDIOGRAM REPORT   Patient Name:   OLTON PREJEAN Date of Exam: 06/11/2019 Medical Rec #:  [id number]      Height:       67.0 in Accession #:    [id number]     Weight:       203.9 lb Date of Birth:  05-Oct-1940       BSA:          2.04 m Patient Age:    79 years       BP:           116/71 mmHg Patient Gender: M              HR:           77 bpm. Exam Location:  Inpatient Procedure: 2D Echo Indications:    acute systolic chf [id number]  History:        Patient has no prior history of Echocardiogram examinations.  Sonographer:    Johny Chess Referring Phys: [place]  1. Left ventricular ejection fraction, by estimation, is 60 to 65%. The left ventricle has normal function. The left ventricle has no regional wall motion abnormalities. Left ventricular diastolic parameters were normal.  2. Right ventricular systolic function is normal. The right ventricular size is normal. There is mildly elevated pulmonary artery systolic pressure.  3. The mitral valve is normal in structure and function. No evidence of mitral valve regurgitation. No evidence of mitral stenosis.  4. Calcified non coronary cusp . The aortic valve is tricuspid. Aortic valve regurgitation is not visualized. Mild to moderate aortic valve sclerosis/calcification is present, without any evidence of aortic stenosis. FINDINGS  Left Ventricle: Left ventricular ejection fraction, by estimation, is 60 to 65%. The left ventricle has normal function. The left ventricle has no regional wall motion abnormalities. The left ventricular internal cavity size was normal in size. There is  no left ventricular hypertrophy. Left ventricular diastolic parameters were normal. Right Ventricle: The  right ventricular size is normal. No increase in right ventricular wall thickness. Right ventricular systolic function is normal. There is mildly elevated pulmonary artery systolic pressure. The tricuspid regurgitant velocity is 2.74  m/s, and with an assumed right atrial pressure of 3 mmHg, the estimated right ventricular systolic pressure is 123456 mmHg. Left Atrium: Left atrial size was normal in size. Right Atrium: Right atrial size was normal in size. Pericardium: There is no evidence of pericardial effusion. Mitral Valve: The mitral  valve is normal in structure and function. There is mild thickening of the mitral valve leaflet(s). There is mild calcification of the mitral valve leaflet(s). Normal mobility of the mitral valve leaflets. Moderate mitral annular  calcification. No evidence of mitral valve regurgitation. No evidence of mitral valve stenosis. Tricuspid Valve: The tricuspid valve is normal in structure. Tricuspid valve regurgitation is mild . No evidence of tricuspid stenosis. Aortic Valve: Calcified non coronary cusp. The aortic valve is tricuspid. Aortic valve regurgitation is not visualized. Mild to moderate aortic valve sclerosis/calcification is present, without any evidence of aortic stenosis. Pulmonic Valve: The pulmonic valve was normal in structure. Pulmonic valve regurgitation is not visualized. No evidence of pulmonic stenosis. Aorta: The aortic root is normal in size and structure. IAS/Shunts: No atrial level shunt detected by color flow Doppler.  LEFT VENTRICLE PLAX 2D LVIDd:         4.10 cm  Diastology LVIDs:         2.40 cm  LV e' lateral:   8.92 cm/s LV PW:         0.90 cm  LV E/e' lateral: 6.7 LV IVS:        0.90 cm  LV e' medial:    6.85 cm/s LVOT diam:     2.00 cm  LV E/e' medial:  8.7 LV SV:         88.28 ml LV SV Index:   25.48 LVOT Area:     3.14 cm  RIGHT VENTRICLE RV S prime:     15.90 cm/s TAPSE (M-mode): 2.0 cm LEFT ATRIUM             Index       RIGHT ATRIUM           Index  LA diam:        3.00 cm 1.47 cm/m  RA Area:     10.10 cm LA Vol (A2C):   27.9 ml 13.68 ml/m RA Volume:   19.30 ml  9.47 ml/m LA Vol (A4C):   23.4 ml 11.48 ml/m LA Biplane Vol: 26.3 ml 12.90 ml/m  AORTIC VALVE LVOT Vmax:   139.00 cm/s LVOT Vmean:  100.000 cm/s LVOT VTI:    0.281 m  AORTA Ao Root diam: 3.30 cm MITRAL VALVE               TRICUSPID VALVE MV Area (PHT): 2.91 cm    TR Peak grad:   30.0 mmHg MV Decel Time: 261 msec    TR Vmax:        274.00 cm/s MV E velocity: 59.70 cm/s MV A velocity: 73.20 cm/s  SHUNTS MV E/A ratio:  0.82        Systemic VTI:  0.28 m                            Systemic Diam: 2.00 cm Jenkins Rouge MD Electronically signed by Jenkins Rouge MD Signature Date/Time: 06/11/2019/2:33:01 PM    Final

## 2019-06-12 NOTE — Plan of Care (Signed)
  Problem: Education: Goal: Knowledge of General Education information will improve Description Including pain rating scale, medication(s)/side effects and non-pharmacologic comfort measures Outcome: Progressing   

## 2019-06-12 NOTE — Progress Notes (Signed)
Procedure(s) (LRB): VIDEO ASSISTED THORACOSCOPY (Right) PLEURADESIS (Right) Subjective: No complaints, no shortness of breath Today's chest x-ray shows pigtail catheter in place without pneumothorax Pigtail catheter currently on suction No air leak through tube when coughing  Objective: Vital signs in last 24 hours: Temp:  [97.7 F (36.5 C)-100.8 F (38.2 C)] 98.5 F (36.9 C) (02/13 1430) Pulse Rate:  [71-76] 72 (02/13 1430) Cardiac Rhythm: Heart block (02/13 0900) Resp:  [16-19] 19 (02/13 1430) BP: (110-130)/(63-76) 110/63 (02/13 1430) SpO2:  [98 %-100 %] 99 % (02/13 1430)  Hemodynamic parameters for last 24 hours:    Intake/Output from previous day: 02/12 0701 - 02/13 0700 In: 720 [P.O.:720] Out: 1009 [Urine:950; Chest Tube:59] Intake/Output this shift: No intake/output data recorded.  Exam  Right chest tube secure Breath sounds clear and equal bilaterally  Lab Results: Recent Labs    06/10/19 1810 06/11/19 0413  WBC 6.8 6.2  HGB 12.8* 12.5*  HCT 38.7* 37.4*  PLT 284 274   BMET:  Recent Labs    06/10/19 1810 06/11/19 0413  NA 137 138  K 4.7 4.1  CL 103 103  CO2 25 26  GLUCOSE 100* 91  BUN 23 23  CREATININE 1.54* 1.58*  CALCIUM 9.3 9.0    PT/INR: No results for input(s): LABPROT, INR in the last 72 hours. ABG room air blood gas normal    Component Value Date/Time   PHART 7.446 06/11/2019 1238   HCO3 26.7 06/11/2019 1238   O2SAT 99.0 06/11/2019 1238   CBG (last 3)  No results for input(s): GLUCAP in the last 72 hours.  Assessment/Plan: S/P spontaneous right pneumothorax-first episode  It appears that the pigtail catheters have resolved the spontaneous pneumothorax.  I placed the chest tube on waterseal and will check x-ray tomorrow.  If the lung remains expanded on waterseal then I would recommend on holding off on surgery for the first episode of a spontaneous pneumothorax despite the morphology of the lung parenchyma on scan.  If the lung  shows evidence of recurrent pneumothorax and failure of chest tube management then I would recommend VATS pleurodesis.  We will follow. Plan discussed with patient and son.  patient examined and medical record reviewed,agree with above note. Tharon Aquas Trigt III 06/12/2019     LOS: 2 days    Tharon Aquas Trigt III 06/12/2019

## 2019-06-13 ENCOUNTER — Inpatient Hospital Stay (HOSPITAL_COMMUNITY): Payer: Medicare HMO

## 2019-06-13 LAB — COMPREHENSIVE METABOLIC PANEL
ALT: 19 U/L (ref 0–44)
AST: 15 U/L (ref 15–41)
Albumin: 3.4 g/dL — ABNORMAL LOW (ref 3.5–5.0)
Alkaline Phosphatase: 33 U/L — ABNORMAL LOW (ref 38–126)
Anion gap: 10 (ref 5–15)
BUN: 20 mg/dL (ref 8–23)
CO2: 24 mmol/L (ref 22–32)
Calcium: 9 mg/dL (ref 8.9–10.3)
Chloride: 99 mmol/L (ref 98–111)
Creatinine, Ser: 1.29 mg/dL — ABNORMAL HIGH (ref 0.61–1.24)
GFR calc Af Amer: >60 mL/min (ref >60)
GFR calc non Af Amer: 52 mL/min — ABNORMAL LOW (ref >60)
Glucose, Bld: 98 mg/dL (ref 70–99)
Potassium: 4.3 mmol/L (ref 3.5–5.1)
Sodium: 133 mmol/L — ABNORMAL LOW (ref 135–145)
Total Bilirubin: 0.8 mg/dL (ref 0.3–1.2)
Total Protein: 6.6 g/dL (ref 6.5–8.1)

## 2019-06-13 LAB — CBC WITH DIFFERENTIAL/PLATELET
Abs Immature Granulocytes: 0.02 10*3/uL (ref 0.00–0.07)
Basophils Absolute: 0 10*3/uL (ref 0.0–0.1)
Basophils Relative: 1 %
Eosinophils Absolute: 0.5 10*3/uL (ref 0.0–0.5)
Eosinophils Relative: 9 %
HCT: 41.3 % (ref 39.0–52.0)
Hemoglobin: 13.9 g/dL (ref 13.0–17.0)
Immature Granulocytes: 0 %
Lymphocytes Relative: 26 %
Lymphs Abs: 1.4 10*3/uL (ref 0.7–4.0)
MCH: 31.7 pg (ref 26.0–34.0)
MCHC: 33.7 g/dL (ref 30.0–36.0)
MCV: 94.3 fL (ref 80.0–100.0)
Monocytes Absolute: 0.6 10*3/uL (ref 0.1–1.0)
Monocytes Relative: 12 %
Neutro Abs: 2.8 10*3/uL (ref 1.7–7.7)
Neutrophils Relative %: 52 %
Platelets: 267 10*3/uL (ref 150–400)
RBC: 4.38 MIL/uL (ref 4.22–5.81)
RDW: 14.6 % (ref 11.5–15.5)
WBC: 5.3 10*3/uL (ref 4.0–10.5)
nRBC: 0 % (ref 0.0–0.2)

## 2019-06-13 NOTE — Progress Notes (Addendum)
Procedure(s) (LRB): VIDEO ASSISTED THORACOSCOPY (Right) PLEURADESIS (Right) Subjective: Lung has remained inflated off suction Will DC tube and check CXR in am  Objective: Vital signs in last 24 hours: Temp:  [98.3 F (36.8 C)-98.5 F (36.9 C)] 98.4 F (36.9 C) (02/14 0522) Pulse Rate:  [67-73] 73 (02/14 0522) Cardiac Rhythm: Heart block (02/13 0900) Resp:  [17-19] 17 (02/13 2014) BP: (110-118)/(63-67) 113/67 (02/14 0522) SpO2:  [98 %-100 %] 98 % (02/14 0522) Weight:  [93.2 kg] 93.2 kg (02/14 0500)  Hemodynamic parameters for last 24 hours:    Intake/Output from previous day: 02/13 0701 - 02/14 0700 In: 100 [IV Piggyback:100] Out: 1000 [Urine:1000] Intake/Output this shift: No intake/output data recorded.    Lab Results: Recent Labs    06/11/19 0413 06/13/19 0251  WBC 6.2 5.3  HGB 12.5* 13.9  HCT 37.4* 41.3  PLT 274 267   BMET:  Recent Labs    06/12/19 1421 06/13/19 0251  NA 134* 133*  K 4.5 4.3  CL 101 99  CO2 24 24  GLUCOSE 104* 98  BUN 24* 20  CREATININE 1.33* 1.29*  CALCIUM 9.3 9.0    PT/INR: No results for input(s): LABPROT, INR in the last 72 hours. ABG    Component Value Date/Time   PHART 7.446 06/11/2019 1238   HCO3 26.7 06/11/2019 1238   O2SAT 99.0 06/11/2019 1238   CBG (last 3)  No results for input(s): GLUCAP in the last 72 hours.  Assessment/Plan: S/P Procedure(s) (LRB): VIDEO ASSISTED THORACOSCOPY (Right) PLEURADESIS (Right) DC chest tube  CXR in am Hold off on VATS If a.m. chest x-ray looks good he can be discharged and I will follow the patient in the office with a chest x-ray in the next week   LOS: 3 days    [name] 06/13/2019

## 2019-06-13 NOTE — Progress Notes (Signed)
NAME:  [name], MRN:  [id number], DOB:  March 27, 1941, LOS: 3 ADMISSION DATE:  06/10/2019, History DATE: 06/10/2019 REFERRING MD:  , CHIEF COMPLAINT: Right sided pneumothorax  Brief History   Patient with a history of rheumatoid arthritis, interstitial lung disease Was recently hospitalized for pneumothorax He was discharged home with a one-way valve in place to follow-up in the office for possible discontinuation of tube Chest x-ray performed 06/10/2019 reveals persistence of right-sided pneumothorax with a functioning one-way valve in place CT reviewed showing multiple blebs Patient may benefit from VATS pleurodesis  Was admitted for management of persistent pneumothorax  Past Medical History   has a past medical history of Adenomatous colon polyp, Diverticulosis, GERD (gastroesophageal reflux disease), Hemorrhoids, Hiatal hernia, Hypertension, Rheumatoid arthritis ([place]), and S/P dilatation of esophageal stricture.   has a past surgical history that includes Hemorrhoid surgery (2002) and Circumcision.   reports that he has never smoked. He has never used smokeless tobacco.   Significant Hospital Events   06/10/2019 - events 2/12 - S/p right chest tube placement. Denies shortness of breath or chest pain  Consults:  CTS- for VATS pleurodesis -Dr. Corliss Parish progress note on 2/13 noted  Procedures:  None  Significant Diagnostic Tests:  Chest x-ray 06/13/2019-chest tube in place, no pneumothorax on chest x-ray, reviewed by myself  Micro Data:  None  Antimicrobials:  None  Interim history/subjective:   Feels fine No pneumothorax on chest x-ray Chest tube to waterseal  Objective   Blood pressure 113/67, pulse 73, temperature 98.4 F (36.9 C), temperature source Oral, resp. rate 17, weight 93.2 kg, SpO2 98 %.        Intake/Output Summary (Last 24 hours) at 06/13/2019 0747 Last data filed at 06/13/2019 [id number] Gross per 24 hour  Intake 100 ml  Output 1000 ml  Net -900  ml   Filed Weights   06/11/19 0500 06/13/19 0500  Weight: 92.5 kg 93.2 kg   General Appearance:  Looks well Head: Normocephalic, atraumatic Eyes: Pupils equal and reactive Lungs: Right chest tube in place, to waterseal.  Clear breath sounds.   Heart:  S1 and S2 normal, no murmur, CVP - no.  Pressors - no Abdomen:  Soft, no masses, no organomegaly Genitalia / Rectal:  Not done Extremities:  Extremities- intact Skin:  ntact in exposed areas . Sacral area - not examined Neurologic: Alert and oriented x3 .  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 Plan -Will follow as outpatient once pneumothorax resolves  AKI on CKD (baseline 115mg % in Jan 2021)History of chronic kidney disease  plan - recheck 2/13 tida and if up dc hctz   Rheumatoid arthritis -  on methotrexate  History of GERD -PPI  Was scheduled for second dose of Covid vaccine 06/11/2019 -Need to reschedule before discharge  Best practice:  Diet: Regular diet Pain/Anxiety/Delirium protocol (if indicated): None VAP protocol (if indicated): Not indicated DVT prophylaxis: Heparin GI prophylaxis: Protonix Glucose control: Monitor Mobility: As tolerated Code Status: Full code Family Communication: patient Disposition: Remain on  floor - ok to dc tele  Sherrilyn Rist, MD [place], PCCM Cell: [phone]

## 2019-06-14 ENCOUNTER — Encounter (HOSPITAL_COMMUNITY)
Admission: AD | Disposition: A | Discharge status: Home / Self Care | Payer: Self-pay | Source: Home / Self Care | Attending: Pulmonary Disease

## 2019-06-14 ENCOUNTER — Inpatient Hospital Stay (HOSPITAL_COMMUNITY): Payer: Medicare HMO | Admitting: Critical Care Medicine

## 2019-06-14 ENCOUNTER — Encounter (HOSPITAL_COMMUNITY): Payer: Self-pay | Admitting: Pulmonary Disease

## 2019-06-14 ENCOUNTER — Inpatient Hospital Stay (HOSPITAL_COMMUNITY): Payer: Medicare HMO

## 2019-06-14 DIAGNOSIS — J939 Pneumothorax, unspecified: Secondary | ICD-10-CM | POA: Diagnosis present

## 2019-06-14 HISTORY — PX: STAPLING OF BLEBS: SHX6429

## 2019-06-14 HISTORY — PX: PLEURADESIS: SHX6030

## 2019-06-14 HISTORY — PX: VIDEO ASSISTED THORACOSCOPY: SHX5073

## 2019-06-14 LAB — BLOOD GAS, ARTERIAL
Acid-Base Excess: 1.5 mmol/L (ref 0.0–2.0)
Bicarbonate: 24.8 mmol/L (ref 20.0–28.0)
Drawn by: 358491
FIO2: 21
O2 Saturation: 99.3 %
Patient temperature: 37
pCO2 arterial: 34.7 mmHg (ref 32.0–48.0)
pH, Arterial: 7.469 — ABNORMAL HIGH (ref 7.350–7.450)
pO2, Arterial: 154 mmHg — ABNORMAL HIGH (ref 83.0–108.0)

## 2019-06-14 LAB — TYPE AND SCREEN
ABO/RH(D): O POS
Antibody Screen: NEGATIVE

## 2019-06-14 LAB — CBC WITH DIFFERENTIAL/PLATELET
Abs Immature Granulocytes: 0.03 10*3/uL (ref 0.00–0.07)
Basophils Absolute: 0 10*3/uL (ref 0.0–0.1)
Basophils Relative: 1 %
Eosinophils Absolute: 0.5 10*3/uL (ref 0.0–0.5)
Eosinophils Relative: 10 %
HCT: 39.2 % (ref 39.0–52.0)
Hemoglobin: 13.4 g/dL (ref 13.0–17.0)
Immature Granulocytes: 1 %
Lymphocytes Relative: 21 %
Lymphs Abs: 1.2 10*3/uL (ref 0.7–4.0)
MCH: 31.8 pg (ref 26.0–34.0)
MCHC: 34.2 g/dL (ref 30.0–36.0)
MCV: 93.1 fL (ref 80.0–100.0)
Monocytes Absolute: 0.7 10*3/uL (ref 0.1–1.0)
Monocytes Relative: 12 %
Neutro Abs: 3.1 10*3/uL (ref 1.7–7.7)
Neutrophils Relative %: 55 %
Platelets: 274 10*3/uL (ref 150–400)
RBC: 4.21 MIL/uL — ABNORMAL LOW (ref 4.22–5.81)
RDW: 14.4 % (ref 11.5–15.5)
WBC: 5.6 10*3/uL (ref 4.0–10.5)
nRBC: 0 % (ref 0.0–0.2)

## 2019-06-14 LAB — GLUCOSE, CAPILLARY
Glucose-Capillary: 153 mg/dL — ABNORMAL HIGH (ref 70–99)
Glucose-Capillary: 150 mg/dL — ABNORMAL HIGH (ref 70–99)

## 2019-06-14 LAB — POCT I-STAT 7, (LYTES, BLD GAS, ICA,H+H)
Acid-Base Excess: 1 mmol/L (ref 0.0–2.0)
Bicarbonate: 24.9 mmol/L (ref 20.0–28.0)
Calcium, Ion: 1.18 mmol/L (ref 1.15–1.40)
HCT: 42 % (ref 39.0–52.0)
Hemoglobin: 14.3 g/dL (ref 13.0–17.0)
O2 Saturation: 98 %
Patient temperature: 98.6
Potassium: 4.4 mmol/L (ref 3.5–5.1)
Sodium: 133 mmol/L — ABNORMAL LOW (ref 135–145)
TCO2: 26 mmol/L (ref 22–32)
pCO2 arterial: 35.1 mmHg (ref 32.0–48.0)
pH, Arterial: 7.459 — ABNORMAL HIGH (ref 7.350–7.450)
pO2, Arterial: 101 mmHg (ref 83.0–108.0)

## 2019-06-14 LAB — ABO/RH: ABO/RH(D): O POS

## 2019-06-14 LAB — PROTIME-INR
INR: 1 (ref 0.8–1.2)
Prothrombin Time: 13 seconds (ref 11.4–15.2)

## 2019-06-14 LAB — APTT: aPTT: 39 seconds — ABNORMAL HIGH (ref 24–36)

## 2019-06-14 SURGERY — VIDEO ASSISTED THORACOSCOPY
Anesthesia: General | Site: Chest | Laterality: Right

## 2019-06-14 MED ORDER — CEFAZOLIN SODIUM-DEXTROSE 2-4 GM/100ML-% IV SOLN
2.0000 g | Freq: Three times a day (TID) | INTRAVENOUS | Status: AC
  Administered 2019-06-14 – 2019-06-15 (×2): 2 g via INTRAVENOUS
  Filled 2019-06-14 (×2): qty 100

## 2019-06-14 MED ORDER — LIDOCAINE 2% (20 MG/ML) 5 ML SYRINGE
INTRAMUSCULAR | Status: AC
  Filled 2019-06-14: qty 5

## 2019-06-14 MED ORDER — PROPOFOL 10 MG/ML IV BOLUS
INTRAVENOUS | Status: AC
  Filled 2019-06-14: qty 20

## 2019-06-14 MED ORDER — PROPOFOL 10 MG/ML IV BOLUS
INTRAVENOUS | Status: DC | PRN
  Administered 2019-06-14: 100 mg via INTRAVENOUS

## 2019-06-14 MED ORDER — ONDANSETRON HCL 4 MG/2ML IJ SOLN
INTRAMUSCULAR | Status: AC
  Filled 2019-06-14: qty 2

## 2019-06-14 MED ORDER — FENTANYL CITRATE (PF) 100 MCG/2ML IJ SOLN: 25.0000 ug | INTRAMUSCULAR | Status: AC | PRN

## 2019-06-14 MED ORDER — LACTATED RINGERS IV SOLN: INTRAVENOUS | Status: DC | PRN

## 2019-06-14 MED ORDER — TRAMADOL HCL 50 MG PO TABS: 50.0000 mg | ORAL_TABLET | Freq: Four times a day (QID) | ORAL | Status: DC | PRN

## 2019-06-14 MED ORDER — BUPIVACAINE 0.5 % ON-Q PUMP SINGLE CATH 400 ML
400.0000 mL | INJECTION | Status: AC
  Administered 2019-06-14: 15:00:00 400 mL
  Filled 2019-06-14: qty 400

## 2019-06-14 MED ORDER — ROCURONIUM BROMIDE 10 MG/ML (PF) SYRINGE
PREFILLED_SYRINGE | INTRAVENOUS | Status: AC
  Filled 2019-06-14: qty 10

## 2019-06-14 MED ORDER — LABETALOL HCL 5 MG/ML IV SOLN
INTRAVENOUS | Status: AC
  Filled 2019-06-14: qty 4

## 2019-06-14 MED ORDER — BUPIVACAINE HCL (PF) 0.5 % IJ SOLN
INTRAMUSCULAR | Status: AC
  Filled 2019-06-14: qty 10

## 2019-06-14 MED ORDER — SUGAMMADEX SODIUM 200 MG/2ML IV SOLN
INTRAVENOUS | Status: DC | PRN
  Administered 2019-06-14: 200 mg via INTRAVENOUS

## 2019-06-14 MED ORDER — LORATADINE 10 MG PO TABS
10.0000 mg | ORAL_TABLET | Freq: Every day | ORAL | Status: DC
  Administered 2019-06-15 – 2019-06-18 (×4): 10 mg via ORAL
  Filled 2019-06-14 (×4): qty 1

## 2019-06-14 MED ORDER — ONDANSETRON HCL 4 MG/2ML IJ SOLN
4.0000 mg | Freq: Four times a day (QID) | INTRAMUSCULAR | Status: DC | PRN
  Administered 2019-06-15: 05:00:00 4 mg via INTRAVENOUS
  Filled 2019-06-14: qty 2

## 2019-06-14 MED ORDER — ROCURONIUM BROMIDE 10 MG/ML (PF) SYRINGE
PREFILLED_SYRINGE | INTRAVENOUS | Status: DC | PRN
  Administered 2019-06-14: 5 mg via INTRAVENOUS
  Administered 2019-06-14: 20 mg via INTRAVENOUS
  Administered 2019-06-14: 10 mg via INTRAVENOUS
  Administered 2019-06-14: 70 mg via INTRAVENOUS

## 2019-06-14 MED ORDER — SENNOSIDES-DOCUSATE SODIUM 8.6-50 MG PO TABS
1.0000 | ORAL_TABLET | Freq: Every day | ORAL | Status: DC
  Administered 2019-06-16: 21:00:00 1 via ORAL
  Filled 2019-06-14: qty 1

## 2019-06-14 MED ORDER — BUPIVACAINE HCL 0.5 % IJ SOLN
INTRAMUSCULAR | Status: DC | PRN
  Administered 2019-06-14: 5 mL

## 2019-06-14 MED ORDER — DEXAMETHASONE SODIUM PHOSPHATE 10 MG/ML IJ SOLN
INTRAMUSCULAR | Status: DC | PRN
  Administered 2019-06-14: 5 mg via INTRAVENOUS

## 2019-06-14 MED ORDER — SODIUM CHLORIDE 0.9 % IV SOLN: INTRAVENOUS | Status: DC

## 2019-06-14 MED ORDER — CEFAZOLIN SODIUM-DEXTROSE 2-4 GM/100ML-% IV SOLN
2.0000 g | INTRAVENOUS | Status: AC
  Administered 2019-06-14: 2 g via INTRAVENOUS
  Filled 2019-06-14: qty 100

## 2019-06-14 MED ORDER — ASPIRIN EC 81 MG PO TBEC
81.0000 mg | DELAYED_RELEASE_TABLET | Freq: Every day | ORAL | Status: DC
  Administered 2019-06-15 – 2019-06-18 (×4): 81 mg via ORAL
  Filled 2019-06-14 (×4): qty 1

## 2019-06-14 MED ORDER — BISACODYL 5 MG PO TBEC
10.0000 mg | DELAYED_RELEASE_TABLET | Freq: Every day | ORAL | Status: DC
  Administered 2019-06-15 – 2019-06-17 (×3): 10 mg via ORAL
  Filled 2019-06-14 (×4): qty 2

## 2019-06-14 MED ORDER — OXYCODONE HCL 5 MG/5ML PO SOLN: 5.0000 mg | Freq: Once | ORAL | Status: DC | PRN

## 2019-06-14 MED ORDER — PHENYLEPHRINE 40 MCG/ML (10ML) SYRINGE FOR IV PUSH (FOR BLOOD PRESSURE SUPPORT)
PREFILLED_SYRINGE | INTRAVENOUS | Status: DC | PRN
  Administered 2019-06-14: 80 ug via INTRAVENOUS

## 2019-06-14 MED ORDER — CHLORHEXIDINE GLUCONATE CLOTH 2 % EX PADS
6.0000 | MEDICATED_PAD | Freq: Every day | CUTANEOUS | Status: DC
  Administered 2019-06-17: 6 via TOPICAL

## 2019-06-14 MED ORDER — DIPHENHYDRAMINE HCL 12.5 MG/5ML PO ELIX: 12.5000 mg | ORAL_SOLUTION | Freq: Four times a day (QID) | ORAL | Status: DC | PRN

## 2019-06-14 MED ORDER — TALC (STERITALC) POWDER FOR INTRAPLEURAL USE
8.0000 g | INTRAPLEURAL | Status: AC
  Administered 2019-06-14: 14:00:00 8 g via INTRAPLEURAL
  Filled 2019-06-14: qty 8

## 2019-06-14 MED ORDER — PHENYLEPHRINE HCL-NACL 10-0.9 MG/250ML-% IV SOLN
INTRAVENOUS | Status: DC | PRN
  Administered 2019-06-14: 50 ug/min via INTRAVENOUS

## 2019-06-14 MED ORDER — ACETAMINOPHEN 500 MG PO TABS
1000.0000 mg | ORAL_TABLET | Freq: Four times a day (QID) | ORAL | Status: DC
  Administered 2019-06-14 – 2019-06-18 (×12): 1000 mg via ORAL
  Filled 2019-06-14 (×13): qty 2

## 2019-06-14 MED ORDER — ACETAMINOPHEN 160 MG/5ML PO SOLN: 1000.0000 mg | Freq: Four times a day (QID) | ORAL | Status: DC

## 2019-06-14 MED ORDER — FENTANYL CITRATE (PF) 250 MCG/5ML IJ SOLN
INTRAMUSCULAR | Status: AC
  Filled 2019-06-14: qty 5

## 2019-06-14 MED ORDER — LIDOCAINE 2% (20 MG/ML) 5 ML SYRINGE
INTRAMUSCULAR | Status: DC | PRN
  Administered 2019-06-14: 20 mg via INTRAVENOUS

## 2019-06-14 MED ORDER — FENTANYL CITRATE (PF) 100 MCG/2ML IJ SOLN: 25.0000 ug | INTRAMUSCULAR | Status: DC | PRN

## 2019-06-14 MED ORDER — DEXAMETHASONE SODIUM PHOSPHATE 10 MG/ML IJ SOLN
INTRAMUSCULAR | Status: AC
  Filled 2019-06-14: qty 1

## 2019-06-14 MED ORDER — INSULIN ASPART 100 UNIT/ML ~~LOC~~ SOLN
0.0000 [IU] | SUBCUTANEOUS | Status: DC
  Administered 2019-06-14 – 2019-06-15 (×6): 2 [IU] via SUBCUTANEOUS

## 2019-06-14 MED ORDER — OXYCODONE HCL 5 MG PO TABS: 5.0000 mg | ORAL_TABLET | Freq: Once | ORAL | Status: DC | PRN

## 2019-06-14 MED ORDER — NALOXONE HCL 0.4 MG/ML IJ SOLN: 0.4000 mg | INTRAMUSCULAR | Status: DC | PRN

## 2019-06-14 MED ORDER — SODIUM CHLORIDE 0.9% FLUSH: 9.0000 mL | INTRAVENOUS | Status: DC | PRN

## 2019-06-14 MED ORDER — ONDANSETRON HCL 4 MG/2ML IJ SOLN
INTRAMUSCULAR | Status: DC | PRN
  Administered 2019-06-14: 4 mg via INTRAVENOUS

## 2019-06-14 MED ORDER — ONDANSETRON HCL 4 MG/2ML IJ SOLN: 4.0000 mg | Freq: Four times a day (QID) | INTRAMUSCULAR | Status: DC | PRN

## 2019-06-14 MED ORDER — MONTELUKAST SODIUM 10 MG PO TABS: 10.0000 mg | ORAL_TABLET | Freq: Every evening | ORAL | Status: DC | PRN

## 2019-06-14 MED ORDER — LABETALOL HCL 5 MG/ML IV SOLN
INTRAVENOUS | Status: DC | PRN
  Administered 2019-06-14: 5 mg via INTRAVENOUS

## 2019-06-14 MED ORDER — DIPHENHYDRAMINE HCL 50 MG/ML IJ SOLN: 12.5000 mg | Freq: Four times a day (QID) | INTRAMUSCULAR | Status: DC | PRN

## 2019-06-14 MED ORDER — BUPIVACAINE ON-Q PAIN PUMP (FOR ORDER SET NO CHG)
INJECTION | Status: AC
  Filled 2019-06-14: qty 1

## 2019-06-14 MED ORDER — FENTANYL 50 MCG/ML IV PCA SOLN
INTRAVENOUS | Status: DC
  Administered 2019-06-14: 260 ug via INTRAVENOUS
  Administered 2019-06-14: 10 ug via INTRAVENOUS
  Administered 2019-06-15: 0 ug via INTRAVENOUS
  Administered 2019-06-15: 280 ug via INTRAVENOUS
  Administered 2019-06-15: 30 ug via INTRAVENOUS
  Administered 2019-06-15: 0 ug via INTRAVENOUS
  Administered 2019-06-15: 230 ug via INTRAVENOUS
  Administered 2019-06-16 (×2): 0 ug via INTRAVENOUS
  Filled 2019-06-14 (×3): qty 20

## 2019-06-14 MED ORDER — SODIUM CHLORIDE 0.9 % IV SOLN: INTRAVENOUS | Status: DC | PRN

## 2019-06-14 MED ORDER — FENTANYL CITRATE (PF) 250 MCG/5ML IJ SOLN
INTRAMUSCULAR | Status: DC | PRN
  Administered 2019-06-14: 50 ug via INTRAVENOUS
  Administered 2019-06-14: 250 ug via INTRAVENOUS
  Administered 2019-06-14: 50 ug via INTRAVENOUS

## 2019-06-14 MED ORDER — 0.9 % SODIUM CHLORIDE (POUR BTL) OPTIME
TOPICAL | Status: DC | PRN
  Administered 2019-06-14: 2000 mL

## 2019-06-14 SURGICAL SUPPLY — 79 items
APPLICATOR COTTON TIP 6 STRL (MISCELLANEOUS) ×1 IMPLANT
APPLICATOR COTTON TIP 6IN STRL (MISCELLANEOUS) ×3
BAG DECANTER FOR FLEXI CONT (MISCELLANEOUS) IMPLANT
BLADE CLIPPER SURG (BLADE) IMPLANT
BLADE SURG 11 STRL SS (BLADE) IMPLANT
CANISTER SUCT 3000ML PPV (MISCELLANEOUS) ×3 IMPLANT
CATH KIT ON-Q SILVERSOAK 5IN (CATHETERS) IMPLANT
CATH KIT ON-Q SILVERSOAKER 10 (CATHETERS) ×3 IMPLANT
CATH ROBINSON RED A/P 22FR (CATHETERS) ×6 IMPLANT
CATH THORACIC 28FR (CATHETERS) ×3 IMPLANT
CATH THORACIC 36FR (CATHETERS) IMPLANT
CATH THORACIC 36FR RT ANG (CATHETERS) IMPLANT
CLEANER TIP ELECTROSURG 2X2 (MISCELLANEOUS) ×3 IMPLANT
CNTNR URN SCR LID CUP LEK RST (MISCELLANEOUS) ×2 IMPLANT
CONT SPEC 4OZ STRL OR WHT (MISCELLANEOUS) ×4
COVER SURGICAL LIGHT HANDLE (MISCELLANEOUS) IMPLANT
CUTTER ECHEON FLEX ENDO 45 340 (ENDOMECHANICALS) ×3 IMPLANT
DERMABOND ADVANCED (GAUZE/BANDAGES/DRESSINGS) ×2
DERMABOND ADVANCED .7 DNX12 (GAUZE/BANDAGES/DRESSINGS) ×1 IMPLANT
DRAPE LAPAROSCOPIC ABDOMINAL (DRAPES) ×3 IMPLANT
DRAPE SLUSH/WARMER DISC (DRAPES) IMPLANT
ELECT BLADE 4.0 EZ CLEAN MEGAD (MISCELLANEOUS) ×3
ELECT BLADE 6.5 EXT (BLADE) ×3 IMPLANT
ELECT REM PT RETURN 9FT ADLT (ELECTROSURGICAL) ×3
ELECTRODE BLDE 4.0 EZ CLN MEGD (MISCELLANEOUS) ×1 IMPLANT
ELECTRODE REM PT RTRN 9FT ADLT (ELECTROSURGICAL) ×1 IMPLANT
GAUZE SPONGE 4X4 12PLY STRL (GAUZE/BANDAGES/DRESSINGS) ×3 IMPLANT
GAUZE SPONGE 4X4 12PLY STRL LF (GAUZE/BANDAGES/DRESSINGS) ×3 IMPLANT
GLOVE BIO SURGEON STRL SZ7.5 (GLOVE) ×6 IMPLANT
GLOVE BIOGEL M STRL SZ7.5 (GLOVE) ×3 IMPLANT
GLOVE BIOGEL PI IND STRL 6.5 (GLOVE) ×2 IMPLANT
GLOVE BIOGEL PI IND STRL 7.5 (GLOVE) ×2 IMPLANT
GLOVE BIOGEL PI INDICATOR 6.5 (GLOVE) ×4
GLOVE BIOGEL PI INDICATOR 7.5 (GLOVE) ×4
GOWN STRL REUS W/ TWL LRG LVL3 (GOWN DISPOSABLE) ×5 IMPLANT
GOWN STRL REUS W/TWL LRG LVL3 (GOWN DISPOSABLE) ×10
KIT BASIN OR (CUSTOM PROCEDURE TRAY) ×3 IMPLANT
KIT SUCTION CATH 14FR (SUCTIONS) ×3 IMPLANT
KIT TURNOVER KIT B (KITS) ×3 IMPLANT
NS IRRIG 1000ML POUR BTL (IV SOLUTION) ×6 IMPLANT
PACK CHEST (CUSTOM PROCEDURE TRAY) ×3 IMPLANT
PAD ARMBOARD 7.5X6 YLW CONV (MISCELLANEOUS) ×6 IMPLANT
SEALANT SURG COSEAL 4ML (VASCULAR PRODUCTS) IMPLANT
SOL ANTI FOG 6CC (MISCELLANEOUS) ×1 IMPLANT
SOLUTION ANTI FOG 6CC (MISCELLANEOUS) ×2
SPONGE TONSIL TAPE 1 RFD (DISPOSABLE) ×3 IMPLANT
STAPLE RELOAD 45MM GOLD (STAPLE) ×9 IMPLANT
SUT CHROMIC 3 0 SH 27 (SUTURE) IMPLANT
SUT ETHILON 3 0 PS 1 (SUTURE) IMPLANT
SUT PROLENE 3 0 SH DA (SUTURE) IMPLANT
SUT PROLENE 4 0 RB 1 (SUTURE)
SUT PROLENE 4-0 RB1 .5 CRCL 36 (SUTURE) IMPLANT
SUT SILK  1 MH (SUTURE) ×2
SUT SILK 1 MH (SUTURE) ×1 IMPLANT
SUT SILK 2 0 SH (SUTURE) ×3 IMPLANT
SUT SILK 2 0SH CR/8 30 (SUTURE) IMPLANT
SUT SILK 3 0SH CR/8 30 (SUTURE) IMPLANT
SUT VIC AB 1 CTX 18 (SUTURE) IMPLANT
SUT VIC AB 2 TP1 27 (SUTURE) IMPLANT
SUT VIC AB 2-0 CT2 18 VCP726D (SUTURE) IMPLANT
SUT VIC AB 2-0 CTX 36 (SUTURE) IMPLANT
SUT VIC AB 2-0 UR6 27 (SUTURE) ×3 IMPLANT
SUT VIC AB 3-0 SH 18 (SUTURE) IMPLANT
SUT VIC AB 3-0 X1 27 (SUTURE) ×3 IMPLANT
SUT VICRYL 0 UR6 27IN ABS (SUTURE) ×6 IMPLANT
SUT VICRYL 2 TP 1 (SUTURE) IMPLANT
SWAB COLLECTION DEVICE MRSA (MISCELLANEOUS) IMPLANT
SWAB CULTURE ESWAB REG 1ML (MISCELLANEOUS) IMPLANT
SYSTEM SAHARA CHEST DRAIN ATS (WOUND CARE) ×3 IMPLANT
TAPE CLOTH SURG 4X10 WHT LF (GAUZE/BANDAGES/DRESSINGS) ×3 IMPLANT
TIP APPLICATOR SPRAY EXTEND 16 (VASCULAR PRODUCTS) IMPLANT
TOWEL GREEN STERILE (TOWEL DISPOSABLE) ×3 IMPLANT
TOWEL GREEN STERILE FF (TOWEL DISPOSABLE) ×3 IMPLANT
TRAP SPECIMEN MUCOUS 40CC (MISCELLANEOUS) ×3 IMPLANT
TRAY FOLEY MTR SLVR 16FR STAT (SET/KITS/TRAYS/PACK) ×3 IMPLANT
TROCAR XCEL NON-BLD 11X100MML (ENDOMECHANICALS) ×3 IMPLANT
TROCAR XCEL NON-BLD 5MMX100MML (ENDOMECHANICALS) IMPLANT
TUNNELER SHEATH ON-Q 11GX8 DSP (PAIN MANAGEMENT) ×3 IMPLANT
WATER STERILE IRR 1000ML POUR (IV SOLUTION) ×6 IMPLANT

## 2019-06-14 NOTE — Anesthesia Procedure Notes (Signed)
Procedure Name: Intubation Date/Time: 06/14/2019 12:54 PM Performed by: Janene Harvey, CRNA Pre-anesthesia Checklist: Patient identified, Emergency Drugs available, Suction available and Patient being monitored Patient Re-evaluated:Patient Re-evaluated prior to induction Oxygen Delivery Method: Circle system utilized Preoxygenation: Pre-oxygenation with 100% oxygen Induction Type: IV induction Ventilation: Mask ventilation without difficulty and Oral airway inserted - appropriate to patient size Laryngoscope Size: Mac and 4 Grade View: Grade I Tube type: Oral Endobronchial tube: Left, EBT position confirmed by fiberoptic bronchoscope and Double lumen EBT and 39 Fr Number of attempts: 1 Airway Equipment and Method: Stylet and Oral airway Placement Confirmation: ETT inserted through vocal cords under direct vision,  positive ETCO2 and breath sounds checked- equal and bilateral Tube secured with: Tape Dental Injury: Teeth and Oropharynx as per pre-operative assessment  Comments: ett placed by R. Lyndle Herrlich

## 2019-06-14 NOTE — Anesthesia Preprocedure Evaluation (Addendum)
Anesthesia Evaluation  Patient identified by MRN, date of birth, ID band Patient awake    Reviewed: Allergy & Precautions, H&P , NPO status , Patient's Chart, lab work & pertinent test results  History of Anesthesia Complications Negative for: history of anesthetic complications  Airway Mallampati: II  TM Distance: >3 FB Neck ROM: full    Dental  (+) Dental Advisory Given, Teeth Intact   Pulmonary COPD,  2/11/20201 SARS coronavirus NEG PTX, blebs Sarcoidosis   breath sounds clear to auscultation       Cardiovascular hypertension, Pt. on medications (-) angina Rhythm:regular Rate:Normal  06/11/2019 ECHO: EF 60-65%, valves OK   Neuro/Psych  Neuromuscular disease    GI/Hepatic Neg liver ROS, hiatal hernia, GERD  Controlled,  Endo/Other  obese  Renal/GU Renal InsufficiencyRenal disease (creat 1.29)     Musculoskeletal  (+) Arthritis , Rheumatoid disorders,    Abdominal   Peds  Hematology   Anesthesia Other Findings   Reproductive/Obstetrics                           Anesthesia Physical Anesthesia Plan  ASA: III  Anesthesia Plan: General   Post-op Pain Management:    Induction: Intravenous  PONV Risk Score and Plan: 2 and Ondansetron, Dexamethasone, Midazolam and Treatment may vary due to age or medical condition  Airway Management Planned: Oral ETT and Double Lumen EBT  Additional Equipment: Arterial line  Intra-op Plan:   Post-operative Plan: Extubation in OR  Informed Consent: I have reviewed the patients History and Physical, chart, labs and discussed the procedure including the risks, benefits and alternatives for the proposed anesthesia with the patient or authorized representative who has indicated his/her understanding and acceptance.     Dental advisory given  Plan Discussed with: CRNA, Anesthesiologist and Surgeon  Anesthesia Plan Comments:       Anesthesia  Quick Evaluation

## 2019-06-14 NOTE — Telephone Encounter (Signed)
Arrangement will be made while he is in the hospital or sooon after discharge to get his second dose

## 2019-06-14 NOTE — Brief Op Note (Signed)
06/10/2019 - 06/14/2019  2:49 PM  PATIENT:  [name]  79 y.o. male  PRE-OPERATIVE DIAGNOSIS:  PTX  POST-OPERATIVE DIAGNOSIS:  PTX  PROCEDURE:  Procedure(s): VIDEO ASSISTED THORACOSCOPY WITH RIGHT UPPER LOBE WEDGE (Right) STAPLING OF BLEBS (Right) PLEURADESIS USING TALC (Right)  SURGEON:  Surgeon(s) and Role:    [name], MD - Primary  PHYSICIAN ASSISTANT:  Nicholes Rough, PA-C   ANESTHESIA:   general  EBL:  150 mL   BLOOD ADMINISTERED: NONE  DRAINS: ROUTINE CHEST TUBE PLACEMENT   LOCAL MEDICATIONS USED:  OTHER ON-Q  SPECIMEN:  Source of Specimen:  blebs  DISPOSITION OF SPECIMEN:  PATHOLOGY  COUNTS:  YES  DICTATION: .Dragon Dictation  PLAN OF CARE: Admit to inpatient   PATIENT DISPOSITION:  ICU - intubated and hemodynamically stable., 2C   Delay start of Pharmacological VTE agent (>24hrs) due to surgical blood loss or risk of bleeding: yes

## 2019-06-14 NOTE — Transfer of Care (Signed)
Immediate Anesthesia Transfer of Care Note  Patient: [name]  Procedure(s) Performed: VIDEO ASSISTED THORACOSCOPY WITH RIGHT UPPER LOBE WEDGE (Right Chest) STAPLING OF BLEBS (Right Chest) PLEURADESIS USING TALC (Right Chest)  Patient Location: PACU  Anesthesia Type:General and GA combined with regional for post-op pain  Level of Consciousness: drowsy  Airway & Oxygen Therapy: Patient Spontanous Breathing and Patient connected to face mask oxygen  Post-op Assessment: Report given to RN, Post -op Vital signs reviewed and stable and Patient moving all extremities  Post vital signs: Reviewed and stable  Last Vitals:  Vitals Value Taken Time  BP 126/77 06/14/19 1523  Temp    Pulse 73 06/14/19 1529  Resp 22 06/14/19 1530  SpO2 100 % 06/14/19 1529  Vitals shown include unvalidated device data.  Last Pain:  Vitals:   06/14/19 0435  TempSrc: Oral  PainSc:          Complications: No apparent anesthesia complications

## 2019-06-14 NOTE — Progress Notes (Signed)
Pre Procedure note for inpatients:   [name] has been scheduled for Procedure(s): VIDEO ASSISTED THORACOSCOPY (Right) STAPLING OF BLEBS (Right) PLEURADESIS (Right) today. The various methods of treatment have been discussed with the patient. After consideration of the risks, benefits and treatment options the patient has consented to the planned procedure.   The patient has been seen and labs reviewed. There are no changes in the patient's condition to prevent proceeding with the planned procedure today.  Recent labs:  Lab Results  Component Value Date   WBC 5.6 06/14/2019   HGB 13.4 06/14/2019   HCT 39.2 06/14/2019   PLT 274 06/14/2019   GLUCOSE 98 06/13/2019   ALT 19 06/13/2019   AST 15 06/13/2019   NA 133 (L) 06/13/2019   K 4.3 06/13/2019   CL 99 06/13/2019   CREATININE 1.29 (H) 06/13/2019   BUN 20 06/13/2019   CO2 24 06/13/2019    Len Childs, MD 06/14/2019 11:08 AM

## 2019-06-14 NOTE — Anesthesia Procedure Notes (Signed)
Arterial Line Insertion Start/End2/15/2021 12:30 PM Performed by: Annye Asa, MD, Janene Harvey, CRNA, CRNA  Preanesthetic checklist: patient identified and risks and benefits discussed Lidocaine 1% used for infiltration and patient sedated Right, radial was placed Catheter size: 20 G Hand hygiene performed  and maximum sterile barriers used  Allen's test indicative of satisfactory collateral circulation Attempts: 2 Procedure performed without using ultrasound guided technique. Following insertion, Biopatch and dressing applied. Post procedure assessment: normal  Patient tolerated the procedure well with no immediate complications. Additional procedure comments: Placed by R. Lyndle Herrlich.

## 2019-06-14 NOTE — Plan of Care (Signed)
  Problem: Pain Managment: Goal: General experience of comfort will improve Outcome: Progressing   Problem: Safety: Goal: Ability to remain free from injury will improve Outcome: Progressing   Problem: Skin Integrity: Goal: Risk for impaired skin integrity will decrease Outcome: Progressing   

## 2019-06-14 NOTE — Telephone Encounter (Signed)
Dr. Olalere - please advise. Thanks. 

## 2019-06-14 NOTE — Op Note (Signed)
[name], [name] MEDICAL RECORD [id number] ACCOUNT [id number] DATE OF BIRTH:06-Nov-1940 FACILITY: [facility] LOCATION: [facility] PHYSICIAN:Atari Novick VAN TRIGT III, MD  OPERATIVE REPORT  DATE OF PROCEDURE:  06/14/2019  OPERATION: 1.  Right VATS (video-assisted thoracoscopic surgery) for resection of blebs from right upper lobe and superior segment right lower lobe. 2.  Mechanical pleurodesis. 3.  Talc pleurodesis, insufflation of the right pleural space.  SURGEON:  [name], MD  ASSISTANT:  Nicholes Rough, PA-C  PREOPERATIVE DIAGNOSIS:  Recurrent spontaneous right pneumothorax.  POSTOPERATIVE DIAGNOSIS:  Recurrent spontaneous right pneumothorax.  ANESTHESIA:  General, Dr. Marcie Bal.  CLINICAL NOTE:  The patient is a 79 year old gentleman who had been recently admitted to the hospital for spontaneous pneumothorax, had a pigtail catheter placed with improvement and then was discharged home with a catheter.  He had a visit to a  physician as an outpatient with an x-ray which showed increased pneumothorax.  He was readmitted and the catheter was placed to suction.  That resolved the pneumothorax and air leak and the pigtail catheter was removed.  However, the next morning, his  x-ray showed evidence of recurrent right pneumothorax and I recommended VATS as the best long-term therapy for his recurrent right pneumothorax.  I discussed the procedure in detail with the patient and son, including the use of general anesthesia, the  location of the surgical incisions, the expected postoperative recovery, and the potential risks of bleeding, infection, recurrent pneumothorax and organ failure.  He demonstrated his understanding and agreed to proceed with surgery under what I felt was  informed consent.  DESCRIPTION OF PROCEDURE:  The patient was brought from preop holding to the operating room after informed consent was documented and the proper site marked and all final issues addressed with the  patient and son.  The patient was placed supine on the  operating table and general anesthesia was induced.  A double lumen tube was placed by the anesthesia team.  The patient was then turned to expose the right side up.  The right chest was prepped and draped as a sterile field.  A proper time-out was  performed.  Three small incisions were made around the circumference of the right hemithorax for the VATS portals.  The camera was inserted.  There was fairly good decompression of the lung with the anesthesia technique.  There was no evidence of tumor nodules or  infection.  There were some blebs at the right medial apex.  There were some blebs at the superior segment of the right lower lobe.  Next, using the VATS instruments and the Ethicon stapling devices, 2 wedge resections were performed to remove blebs on the visceral pleural surface from the right apex and the superior segment of the right lower lobe.  The staple lines were intact.  Next, we did a mechanical pleurodesis of the parietal pleura of the right pleural space using scratch pad for the electrocautery unit.  This resulted in good mechanical abrasion.  Next, we insufflated 4 g of talc into the right pleural space under direct  vision.  This provided talc coverage of the pleural space.  Next, a 28-French chest tube was placed and directed to the apex and secured to the skin.  The lung was then reexpanded under direct vision.  The 3 VATS incisions were closed in layers using Vicryl.  Next, an On-Q analgesia irrigation system was placed in the mid right thorax in the location of the previous incisions.  This was secured to the skin and  connected to Marcaine 0.5% reservoir.  The patient was then turned supine, extubated and returned to recovery room in stable condition.  There was no visible air leak from the Pleur-Evac upon transfer.  VN/NUANCE  D:06/14/2019 T:06/14/2019 JOB:[id number]

## 2019-06-14 NOTE — Progress Notes (Signed)
Procedure(s) (LRB): VIDEO ASSISTED THORACOSCOPY (Right) PLEURADESIS (Right) Subjective: cxr this am shows recurrent small R apical pmeumothorax Will set up for surgery vats tomorrow Objective: Vital signs in last 24 hours: Temp:  [98.3 F (36.8 C)-98.6 F (37 C)] 98.6 F (37 C) (02/15 0435) Pulse Rate:  [70-78] 70 (02/15 0435) Resp:  [18] 18 (02/15 0435) BP: (111-124)/(60-63) 112/61 (02/15 0435) SpO2:  [98 %-100 %] 98 % (02/15 0435) Weight:  [91.6 kg] 91.6 kg (02/15 0609)  Hemodynamic parameters for last 24 hours:  stable  Intake/Output from previous day: 02/14 0701 - 02/15 0700 In: 980 [P.O.:980] Out: -  Intake/Output this shift: No intake/output data recorded.    Lab Results: Recent Labs    06/13/19 0251  WBC 5.3  HGB 13.9  HCT 41.3  PLT 267   BMET:  Recent Labs    06/12/19 1421 06/13/19 0251  NA 134* 133*  K 4.5 4.3  CL 101 99  CO2 24 24  GLUCOSE 104* 98  BUN 24* 20  CREATININE 1.33* 1.29*  CALCIUM 9.3 9.0    PT/INR: No results for input(s): LABPROT, INR in the last 72 hours. ABG    Component Value Date/Time   PHART 7.446 06/11/2019 1238   HCO3 26.7 06/11/2019 1238   O2SAT 99.0 06/11/2019 1238   CBG (last 3)  No results for input(s): GLUCAP in the last 72 hours.  Assessment/Plan: S/P Procedure(s) (LRB): VIDEO ASSISTED THORACOSCOPY (Right) PLEURADESIS (Right) recurent R spontaneous pneumothorax Will need VATS, bleb stapling and pleurodesis  LOS: 4 days    Tharon Aquas Trigt III 06/14/2019

## 2019-06-15 ENCOUNTER — Inpatient Hospital Stay (HOSPITAL_COMMUNITY): Payer: Medicare HMO

## 2019-06-15 ENCOUNTER — Telehealth: Payer: Self-pay | Admitting: Pulmonary Disease

## 2019-06-15 DIAGNOSIS — J849 Interstitial pulmonary disease, unspecified: Secondary | ICD-10-CM

## 2019-06-15 LAB — GLUCOSE, CAPILLARY
Glucose-Capillary: 103 mg/dL — ABNORMAL HIGH (ref 70–99)
Glucose-Capillary: 128 mg/dL — ABNORMAL HIGH (ref 70–99)
Glucose-Capillary: 132 mg/dL — ABNORMAL HIGH (ref 70–99)
Glucose-Capillary: 148 mg/dL — ABNORMAL HIGH (ref 70–99)
Glucose-Capillary: 152 mg/dL — ABNORMAL HIGH (ref 70–99)
Glucose-Capillary: 92 mg/dL (ref 70–99)

## 2019-06-15 LAB — BLOOD GAS, ARTERIAL
Acid-base deficit: 0 mmol/L (ref 0.0–2.0)
Bicarbonate: 24.3 mmol/L (ref 20.0–28.0)
Drawn by: 446341
FIO2: 28
O2 Saturation: 96.1 %
Patient temperature: 36.8
pCO2 arterial: 40.2 mmHg (ref 32.0–48.0)
pH, Arterial: 7.397 (ref 7.350–7.450)
pO2, Arterial: 84.4 mmHg (ref 83.0–108.0)

## 2019-06-15 LAB — SURGICAL PATHOLOGY

## 2019-06-15 LAB — CBC
HCT: 38.7 % — ABNORMAL LOW (ref 39.0–52.0)
Hemoglobin: 13 g/dL (ref 13.0–17.0)
MCH: 31.9 pg (ref 26.0–34.0)
MCHC: 33.6 g/dL (ref 30.0–36.0)
MCV: 94.9 fL (ref 80.0–100.0)
Platelets: 258 10*3/uL (ref 150–400)
RBC: 4.08 MIL/uL — ABNORMAL LOW (ref 4.22–5.81)
RDW: 14.6 % (ref 11.5–15.5)
WBC: 12.8 10*3/uL — ABNORMAL HIGH (ref 4.0–10.5)
nRBC: 0 % (ref 0.0–0.2)

## 2019-06-15 LAB — BASIC METABOLIC PANEL
Anion gap: 10 (ref 5–15)
BUN: 20 mg/dL (ref 8–23)
CO2: 22 mmol/L (ref 22–32)
Calcium: 8.6 mg/dL — ABNORMAL LOW (ref 8.9–10.3)
Chloride: 98 mmol/L (ref 98–111)
Creatinine, Ser: 1.31 mg/dL — ABNORMAL HIGH (ref 0.61–1.24)
GFR calc Af Amer: 60 mL/min — ABNORMAL LOW (ref >60)
GFR calc non Af Amer: 51 mL/min — ABNORMAL LOW (ref >60)
Glucose, Bld: 136 mg/dL — ABNORMAL HIGH (ref 70–99)
Potassium: 5 mmol/L (ref 3.5–5.1)
Sodium: 130 mmol/L — ABNORMAL LOW (ref 135–145)

## 2019-06-15 MED ORDER — AMLODIPINE BESYLATE 10 MG PO TABS
10.0000 mg | ORAL_TABLET | Freq: Every day | ORAL | Status: DC
  Administered 2019-06-16 – 2019-06-18 (×3): 10 mg via ORAL
  Filled 2019-06-15 (×3): qty 1

## 2019-06-15 MED ORDER — IRBESARTAN 150 MG PO TABS
150.0000 mg | ORAL_TABLET | Freq: Every day | ORAL | Status: DC
  Administered 2019-06-15 – 2019-06-18 (×4): 150 mg via ORAL
  Filled 2019-06-15 (×4): qty 1

## 2019-06-15 MED ORDER — METOPROLOL TARTRATE 5 MG/5ML IV SOLN: 2.5000 mg | Freq: Four times a day (QID) | INTRAVENOUS | Status: DC | PRN

## 2019-06-15 MED ORDER — HYDROCHLOROTHIAZIDE 25 MG PO TABS
25.0000 mg | ORAL_TABLET | Freq: Every day | ORAL | Status: DC
  Administered 2019-06-15 – 2019-06-18 (×4): 25 mg via ORAL
  Filled 2019-06-15 (×4): qty 1

## 2019-06-15 MED ORDER — WHITE PETROLATUM EX OINT
TOPICAL_OINTMENT | CUTANEOUS | Status: AC
  Administered 2019-06-15: 1
  Filled 2019-06-15: qty 28.35

## 2019-06-15 NOTE — Anesthesia Postprocedure Evaluation (Signed)
Anesthesia Post Note  Patient: [name]  Procedure(Mills) Performed: VIDEO ASSISTED THORACOSCOPY WITH RIGHT UPPER LOBE WEDGE (Right Chest) STAPLING OF BLEBS (Right Chest) PLEURADESIS USING TALC (Right Chest)     Patient location during evaluation: PACU Anesthesia Type: General Level of consciousness: awake and alert Pain management: pain level controlled Vital Signs Assessment: post-procedure vital signs reviewed and stable Respiratory status: spontaneous breathing, nonlabored ventilation, respiratory function stable and patient connected to nasal cannula oxygen Cardiovascular status: blood pressure returned to baseline and stable Postop Assessment: no apparent nausea or vomiting Anesthetic complications: no    Last Vitals:  Vitals:   06/15/19 0353 06/15/19 0733  BP:  126/62  Pulse:  72  Resp: 13 12  Temp:  36.8 C  SpO2: 94% 95%    Last Pain:  Vitals:   06/15/19 0733  TempSrc: Oral  PainSc: 0-No pain                 [name]

## 2019-06-15 NOTE — Progress Notes (Addendum)
NAME:  [name], MRN:  [id number], DOB:  [birth year], LOS: 5 ADMISSION DATE:  06/10/2019, History DATE: 06/10/2019 REFERRING MD:  , CHIEF COMPLAINT: Right sided pneumothorax  Brief History   Patient with a history of rheumatoid arthritis, interstitial lung disease Was recently hospitalized for pneumothorax He was discharged home with a one-way valve in place to follow-up in the office for possible discontinuation of tube Chest x-ray performed 06/10/2019 reveals persistence of right-sided pneumothorax with a functioning one-way valve in place CT reviewed showing multiple blebs Patient may benefit from VATS pleurodesis  Was admitted for management of persistent pneumothorax  Past Medical History   has a past medical history of Adenomatous colon polyp, Diverticulosis, GERD (gastroesophageal reflux disease), Hemorrhoids, Hiatal hernia, Hypertension, Rheumatoid arthritis ([place]), and S/P dilatation of esophageal stricture.   has a past surgical history that includes Hemorrhoid surgery (2002) and Circumcision.   reports that he has never smoked. He has never used smokeless tobacco.   Significant Hospital Events   06/10/2019 - events 2/12 - S/p right chest tube placement. Denies shortness of breath or chest pain 2/15 to OR: Dr Prescott Gum  Consults:  CTS  Procedures:  2/15 to OR for VATS with stapling of blebs and Right talc pleuradesis.   Significant Diagnostic Tests:  Chest x-ray 06/13/2019- chest tube in place, no pneumothorax on chest x-ray, reviewed by myself  Micro Data:  None  Antimicrobials:  None  Interim history/subjective:  Mild cough with blood tinged sputum. No pain.   Objective   Blood pressure 126/62, pulse 72, temperature 98.3 F (36.8 C), temperature source Oral, resp. rate 10, weight 91.6 kg, SpO2 98 %.        Intake/Output Summary (Last 24 hours) at 06/15/2019 1025 Last data filed at 06/15/2019 [id number] Gross per 24 hour  Intake 2650.42 ml  Output 1335 ml   Net 1315.42 ml   Filed Weights   06/11/19 0500 06/13/19 0500 06/14/19 [id number]  Weight: 92.5 kg 93.2 kg 91.6 kg   General Appearance:  Elderly appearing male in NAD Head: Normocephalic, atraumatic Eyes: Pupils equal and reactive Lungs: Right chest tube in place, to suction.  Clear breath sounds.  No air leak on pleuravac Heart:  RRR, no MRG Abdomen:  Soft, non-tender, non-distended Extremities:  No acute deformity or ROM limitation Skin:  Grossly intact Neurologic: Alert and oriented x3 .  Resolved Hospital Problem list     Assessment & Plan:   Right-sided pneumothorax: Secondary to underlying interstitial lung disease. Known diagnosis of rheumatoid arthritis. Admitted previously and was discharged home with one way valve. Unfortunately pneumothorax reoccurred. He is now s/p VATS with bleb repair and talc pleurodesis 2/15. - Chest tube to suction - per primary (CVTS) - Follow CXR  ILD - he seeems to have classic RA-ILD with emphsematous blebs - Will follow as outpatient once pneumothorax resolves  AKI on CKD: creatinine stable at 1.3 (baseline seems to be about 1.12) History of chronic kidney disease - Follow BMP  Rheumatoid arthritis -  on methotrexate  History of GERD - PPI  Was scheduled for second dose of Covid vaccine 06/11/2019 - Need to reschedule before discharge  Best practice:  Diet: Regular diet Pain/Anxiety/Delirium protocol (if indicated): PCA  VAP protocol (if indicated): Not indicated DVT prophylaxis: Heparin GI prophylaxis: Protonix Glucose control: Monitor Mobility: As tolerated Code Status: Full code Family Communication: patient, son in room as well.  Disposition: Tele   Georgann Housekeeper, AGACNP-BC [place] for personal  pager PCCM on call pager [phone]  06/15/2019 10:43 AM    PCCM attending:  79 yo M, h/o RA ILD, recurrent PTX, taken to OR by Dr. Nils Pyle for VATs. POD1 doing well.  CT in place.  TCTS managing. Now on their service.   BP 126/62 (BP Location: Left Arm)   Pulse 72   Temp 98.3 F (36.8 C) (Oral)   Resp 14   Wt 91.6 kg   SpO2 98%   BMI 31.63 kg/m   Gen: restin gin bed, no distress, walking in hallway prior to visit  HEENT: NCAT, sclera clear, tracking  Neck: trachea midline  Heart: RRR, s1 s2 Lungs: CTAB   Labs reviewed  CXR:  No ptx, ct in place, The patient's images have been independently reviewed by me.    A:  Recurrent PTX s/p VATS  RA-ILD  AKI on CKD  On Mtx   P: CT management by TCTS  Follow up to be arranged at [facility] with Dr. Ander Slade for ILD  We will arrange this at discharge  Continue current mtx dosing  Can get covid vaccine up to 42 days after his first.  This can be rescheduled as an op  Pulmonary consult will follow as needed. Please call with any concerns.   Garner Nash, DO [facility] 06/15/2019 3:28 PM

## 2019-06-15 NOTE — Progress Notes (Addendum)
[address]       [place] [address]             [phone]      1 Day Post-Op Procedure(s) (LRB): VIDEO ASSISTED THORACOSCOPY WITH RIGHT UPPER LOBE WEDGE (Right) STAPLING OF BLEBS (Right) PLEURADESIS USING TALC (Right) Subjective: Feels pretty well, not significantly SOB  Objective: Vital signs in last 24 hours: Temp:  [97.6 F (36.4 C)-98.5 F (36.9 C)] 98.3 F (36.8 C) (02/16 0733) Pulse Rate:  [67-99] 72 (02/16 0733) Cardiac Rhythm: Normal sinus rhythm (02/16 0733) Resp:  [9-23] 12 (02/16 0733) BP: (124-156)/(62-77) 126/62 (02/16 0733) SpO2:  [94 %-100 %] 95 % (02/16 0733) Arterial Line BP: (136-185)/(47-67) 146/61 (02/16 0733)  Hemodynamic parameters for last 24 hours:    Intake/Output from previous day: 02/15 0701 - 02/16 0700 In: 2410.4 [P.O.:233; I.V.:1877.4; IV Piggyback:300] Out: 1025 [Urine:775; Blood:150; Chest Tube:100] Intake/Output this shift: Total I/O In: 240 [P.O.:240] Out: -   General appearance: alert, cooperative and no distress Heart: regular rate and rhythm Lungs: clear Abdomen: benign Extremities: no edema- PAS in place Wound: dressings clean  Lab Results: Recent Labs    06/14/19 0800 06/14/19 0800 06/14/19 1232 06/15/19 0350  WBC 5.6  --   --  12.8*  HGB 13.4   < > 14.3 13.0  HCT 39.2   < > 42.0 38.7*  PLT 274  --   --  258   < > = values in this interval not displayed.   BMET:  Recent Labs    06/13/19 0251 06/13/19 0251 06/14/19 1232 06/15/19 0350  NA 133*   < > 133* 130*  K 4.3   < > 4.4 5.0  CL 99  --   --  98  CO2 24  --   --  22  GLUCOSE 98  --   --  136*  BUN 20  --   --  20  CREATININE 1.29*  --   --  1.31*  CALCIUM 9.0  --   --  8.6*   < > = values in this interval not displayed.    PT/INR:  Recent Labs    06/14/19 1100  LABPROT 13.0  INR 1.0   ABG    Component Value Date/Time   PHART 7.397 06/15/2019 0505   HCO3 24.3 06/15/2019 0505   TCO2 26 06/14/2019 1232   ACIDBASEDEF 0.0 06/15/2019 0505   O2SAT 96.1 06/15/2019 0505   CBG (last 3)  Recent Labs    06/14/19 1952 06/14/19 2302 06/15/19 0346  GLUCAP 153* 150* 132*    Meds Scheduled Meds: . acetaminophen  1,000 mg Oral Q6H   Or  . acetaminophen (TYLENOL) oral liquid 160 mg/5 mL  1,000 mg Oral Q6H  . aspirin EC  81 mg Oral Daily  . bisacodyl  10 mg Oral Daily  . Chlorhexidine Gluconate Cloth  6 each Topical Daily  . fentaNYL   Intravenous Q4H  . insulin aspart  0-24 Units Subcutaneous Q4H  . loratadine  10 mg Oral Daily  . senna-docusate  1 tablet Oral QHS   Continuous Infusions: . sodium chloride    . sodium chloride 100 mL/hr at 06/15/19 0700  . bupivacaine ON-Q pain pump     PRN Meds:.Place/Maintain arterial line **AND** sodium chloride, diphenhydrAMINE **OR** diphenhydrAMINE, fentaNYL (SUBLIMAZE) injection, montelukast, naloxone **AND** sodium chloride flush, ondansetron (ZOFRAN) IV, traMADol  Xrays DG Chest 2 View  Result Date: 06/14/2019 CLINICAL DATA:  Interval  removal of right-sided chest tube. EXAM: CHEST - 2 VIEW COMPARISON:  06/13/2019; 06/12/2019; 06/11/2019 FINDINGS: Interval development of a small right apical pneumothorax following recent right-sided chest tube removal. Pulmonary vasculature appears slightly less distinct than present examination with cephalization of flow. Grossly unchanged bibasilar interstitial opacities. No new focal airspace opacities. No definite pleural effusion. No acute osseous abnormalities. Stigmata of dish within the thoracic spine. IMPRESSION: 1. Interval development of a small right apical pneumothorax following right-sided chest tube removal. 2. Suspected pulmonary edema superimposed on advanced emphysematous change. Electronically Signed   By: Sandi Mariscal M.D.   On: 06/14/2019 08:03   DG Chest Port 1 View  Result Date: 06/14/2019 CLINICAL DATA:  Right pneumothorax. Status post video-assisted thoracoscopy with right upper lobe wedge  resection and stapling of blebs and pleurodesis with talc. Chest tube insertion. EXAM: PORTABLE CHEST 1 VIEW 3:27 p.m. COMPARISON:  06/14/2019 at 6:55 a.m. FINDINGS: Right chest tube in place. No visible residual pneumothorax. Surgical staples in the right upper lobe. Chronic interstitial disease bilaterally, most prominent in the lung bases but also in the upper lobes. Heart size and vascularity are normal. Aortic atherosclerosis. IMPRESSION: No pneumothorax. Chronic interstitial lung disease. Right chest tube in place. Electronically Signed   By: Lorriane Shire M.D.   On: 06/14/2019 15:45    Assessment/Plan: S/P Procedure(s) (LRB): VIDEO ASSISTED THORACOSCOPY WITH RIGHT UPPER LOBE WEDGE (Right) STAPLING OF BLEBS (Right) PLEURADESIS USING TALC (Right)  1 overall doing well 2 hemodyn stable in sinus rhythm 3 sats good on 2 liters 4 CXR - no pntx 5 CT- 90 cc, bloody, no air leak- cont CT to suction 6 mild hyponatremia, will decease IVF to 50 cc/hr 7 renal fxn stable with mild insuff.  8 minor leukkocytosis- monitor 9 minor anemia- monitor 10 routine pulm rx/rehab   LOS: 5 days    [name] PA-C 06/15/2019 Pager [phone] Patient examined and CXR viewed Chest tube to water seal tomorrow- prob out Thurs Path on RUL RLL wedge- blebs Doing well P Prescott Gum MD

## 2019-06-15 NOTE — Progress Notes (Signed)
RN encouraged patient to get up to the chair for breakfast and for gown change as well as bed change.  Patient states he is experiencing some increased soreness right now and would like to have breakfast in bed and then clean up following.

## 2019-06-15 NOTE — Telephone Encounter (Signed)
PCCM:  Please schedule follow up in 2-3 weeks with Dr. Ander Slade from discharge.   Garner Nash, DO [facility] 06/15/2019 3:30 PM

## 2019-06-15 NOTE — Telephone Encounter (Signed)
Pt is currently admitted to hospital with no date noted of when potential discharge might be. Due to that, will hold encounter in triage so we can get pt scheduled for HFU with Dr. Ander Slade 2-3 weeks from discharge date.

## 2019-06-16 ENCOUNTER — Inpatient Hospital Stay (HOSPITAL_COMMUNITY): Payer: Medicare HMO

## 2019-06-16 LAB — CBC
HCT: 34.9 % — ABNORMAL LOW (ref 39.0–52.0)
Hemoglobin: 11.6 g/dL — ABNORMAL LOW (ref 13.0–17.0)
MCH: 31.9 pg (ref 26.0–34.0)
MCHC: 33.2 g/dL (ref 30.0–36.0)
MCV: 95.9 fL (ref 80.0–100.0)
Platelets: 198 10*3/uL (ref 150–400)
RBC: 3.64 MIL/uL — ABNORMAL LOW (ref 4.22–5.81)
RDW: 14.8 % (ref 11.5–15.5)
WBC: 8.1 10*3/uL (ref 4.0–10.5)
nRBC: 0 % (ref 0.0–0.2)

## 2019-06-16 LAB — COMPREHENSIVE METABOLIC PANEL
ALT: 15 U/L (ref 0–44)
AST: 22 U/L (ref 15–41)
Albumin: 3.1 g/dL — ABNORMAL LOW (ref 3.5–5.0)
Alkaline Phosphatase: 28 U/L — ABNORMAL LOW (ref 38–126)
Anion gap: 10 (ref 5–15)
BUN: 20 mg/dL (ref 8–23)
CO2: 22 mmol/L (ref 22–32)
Calcium: 8.5 mg/dL — ABNORMAL LOW (ref 8.9–10.3)
Chloride: 100 mmol/L (ref 98–111)
Creatinine, Ser: 1.08 mg/dL (ref 0.61–1.24)
GFR calc Af Amer: >60 mL/min (ref >60)
GFR calc non Af Amer: >60 mL/min (ref >60)
Glucose, Bld: 100 mg/dL — ABNORMAL HIGH (ref 70–99)
Potassium: 4.6 mmol/L (ref 3.5–5.1)
Sodium: 132 mmol/L — ABNORMAL LOW (ref 135–145)
Total Bilirubin: 1 mg/dL (ref 0.3–1.2)
Total Protein: 6 g/dL — ABNORMAL LOW (ref 6.5–8.1)

## 2019-06-16 LAB — GLUCOSE, CAPILLARY
Glucose-Capillary: 100 mg/dL — ABNORMAL HIGH (ref 70–99)
Glucose-Capillary: 87 mg/dL (ref 70–99)
Glucose-Capillary: 120 mg/dL — ABNORMAL HIGH (ref 70–99)
Glucose-Capillary: 103 mg/dL — ABNORMAL HIGH (ref 70–99)

## 2019-06-16 MED ORDER — INSULIN ASPART 100 UNIT/ML ~~LOC~~ SOLN
0.0000 [IU] | Freq: Three times a day (TID) | SUBCUTANEOUS | Status: DC
  Administered 2019-06-17: 17:00:00 2 [IU] via SUBCUTANEOUS
  Administered 2019-06-17: 22:00:00 3 [IU] via SUBCUTANEOUS

## 2019-06-16 MED ORDER — INSULIN ASPART 100 UNIT/ML ~~LOC~~ SOLN: 0.0000 [IU] | SUBCUTANEOUS | Status: DC

## 2019-06-16 NOTE — Discharge Summary (Addendum)
[name]       [place] [address]             [phone]      Physician Discharge Summary  Patient ID: [name] MRN: [id number] DOB/AGE: 1940/12/19 79 y.o.  Admit date: 06/10/2019 Discharge date: 06/18/2019  Admission Diagnoses:  Patient Active Problem List   Diagnosis Date Noted   Recurrent pneumothorax 06/14/2019   Pneumothorax on right 05/28/2019   ARF (acute renal failure) ([place]) 11/08/2015   AKI (acute kidney injury) ([place]) 11/07/2015   Normocytic anemia 12/01/2014   FLATULENCE-GAS-BLOATING 10/03/2009   ARTHRITIS, RHEUMATOID 06/25/2007   HEMORRHOIDS 12/24/2005   ESOPHAGEAL STRICTURE 12/24/2005   GERD 12/24/2005   HIATAL HERNIA 12/24/2005   DIVERTICULOSIS, COLON 12/24/2005   ESOPHAGITIS, REFLUX 02/18/2001   COLONIC POLYPS, HYPERPLASTIC 02/10/2001    Discharge Diagnoses:  Active Problems:   Pneumothorax on right   Recurrent pneumothorax   Discharged Condition: good  HPI:   [name] is a 79 year old male patient who presented to the emergency department on 05/28/2019 with a chief complaint of shortness of breath.  The patient was on 5 L of nasal cannula oxygen support at the time of arrival and the patient stated he had a history of pneumonia.  He had no known Covid exposures and received his first dose of the code vaccine the week prior to arriving to the ED.  At the time of admission he had a nonproductive cough, associated chest discomfort, and shortness of breath.  He denied fever, nasal congestion, chest pain with the exception of coughing, nausea, vomiting, palpitations, or sick contacts.  A chest x-ray was obtained which showed a large right pneumothorax with a tension component.  Consolidation in the central portion of the collapsed lung.  The ED physician inserted a right small bore 14 inch French chest tube with partial reexpansion of the right lung.  There was a small air leak after insertion. The patient left the hospital six  days later and returned after his primary care doctor advised him to go back to the hospital.  After serial chest x-rays, the pneumothorax eventually resolved.  He had a CT scan on 05/28/2019 which showed moderately advanced pulmonary emphysema. Multiple blebs on CT scan but never a smoker. We were consulted for a video-assisted thoracoscopic surgery with pleurodesis.    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  However, he has no known exposures. He has rheumatoid arthritis and has taking methotrexate for over 20 years.     Hospital Course:   On 06/14/2019 Mr. Pirkle underwent a right VATs, mechanical and talc pleurodesis, and resection of blebs from the right upper lobe and superior segment right lower lobe. He tolerated the procedure well and was transferred to the stepdown unit. He was extubated in a timely manner. POD 1 he was progressing well. He remained on 2L [place] with good oxygen saturation. He did have some minor leukocytosis which we trended. Chest tube continued to drain and was placed on water seal on POD 2. We discontinued the PCA and continued his oral pain medication. His renal function remained stable. His chest tube was removed on 2/18. Follow up CXR showed no pneumothorax. Today, he is ambulating with limited assistance, tolerating room air, his incisions are healing well and he is ready for discharge home.  Consults:  critical care, pulmonary  Significant Diagnostic Studies:   CLINICAL DATA:  Chest tube, pneumothorax, shortness of breath   EXAM: PORTABLE CHEST 1 VIEW   COMPARISON:  06/14/2019   FINDINGS: Right chest tube remains in place, unchanged. No pneumothorax. Chronic interstitial prominence bilaterally. No acute confluent opacities or effusions. Heart is normal size.   IMPRESSION: Right chest tube remains in place,  unchanged. No pneumothorax. No change since prior study.     Electronically Signed   By: [name] M.D.   On: 06/15/2019 08:43   Treatments:    NAME: TSUNEO, [name] MEDICAL RECORD [id number] ACCOUNT [id number] DATE OF BIRTH:October 25, 1940 FACILITY: [facility] LOCATION: [facility] PHYSICIAN:[name] VAN TRIGT III, MD   OPERATIVE REPORT   DATE OF PROCEDURE:  06/14/2019   OPERATION: 1.  Right VATS (video-assisted thoracoscopic surgery) for resection of blebs from right upper lobe and superior segment right lower lobe. 2.  Mechanical pleurodesis. 3.  Talc pleurodesis, insufflation of the right pleural space.   SURGEON:  [name], MD   ASSISTANT:  Nicholes Rough, PA-C   PREOPERATIVE DIAGNOSIS:  Recurrent spontaneous right pneumothorax.   POSTOPERATIVE DIAGNOSIS:  Recurrent spontaneous right pneumothorax.   ANESTHESIA:  General, Dr. Marcie Bal.   CLINICAL NOTE:  The patient is a 79 year old gentleman who had been recently admitted to the hospital for spontaneous pneumothorax, had a pigtail catheter placed with improvement and then was discharged home with a catheter.  He had a visit to a  physician as an outpatient with an x-ray which showed increased pneumothorax.  He was readmitted and the catheter was placed to suction.  That resolved the pneumothorax and air leak and the pigtail catheter was removed.  However, the next morning, his  x-ray showed evidence of recurrent right pneumothorax and I recommended VATS as the best long-term therapy for his recurrent right pneumothorax.  I discussed the procedure in detail with the patient and son, including the use of general anesthesia, the  location of the surgical incisions, the expected postoperative recovery, and the potential risks of bleeding, infection, recurrent pneumothorax and organ failure.  He demonstrated his understanding and agreed to proceed with surgery under what I felt was informed consent.   Discharge Exam: Blood pressure  132/64, pulse 81, temperature 98 F (36.7 C), temperature source Oral, resp. rate 13, height 5\' 7"  (1.702 m), weight 91.6 kg, SpO2 99 %.   General appearance: alert, cooperative and no distress Heart: regular rate and rhythm, S1, S2 normal, no murmur, click, rub or gallop Lungs: clear to auscultation bilaterally Abdomen: soft, non-tender; bowel sounds normal; no masses,  no organomegaly Extremities: extremities normal, atraumatic, no cyanosis or edema Wound: clean and dry     Disposition:  Discharge disposition: 01-Home or Self Care         Allergies as of 06/18/2019   No Known Allergies      Medication List     TAKE these medications    acetaminophen 500 MG tablet Commonly known as: TYLENOL Take 2 tablets (1,000 mg total) by mouth every 6 (six) hours.   amLODipine 10 MG tablet Commonly known as: NORVASC Take 1 tablet (10 mg total) by mouth daily.   aspirin EC 81 MG tablet Take 81 mg by mouth daily.   cetirizine 10 MG tablet Commonly known as: ZYRTEC Take 10 mg by mouth daily as needed for allergies.   methotrexate 2.5 MG tablet Commonly known as: RHEUMATREX Take 10 mg by mouth See admin instructions. Take 10mg  every Thursday  and Friday   montelukast 10 MG tablet Commonly known as: SINGULAIR Take 10 mg by mouth at bedtime as needed (allergies/sleep).   Muscle Rub 10-15 % Crea Apply 1 application topically as needed for muscle pain (shoulder/neck).   omeprazole 20 MG capsule Commonly known as: PRILOSEC Take 1 capsule (20 mg total) by mouth daily. Patient needs office visit for further refills!!!   traMADol 50 MG tablet Commonly known as: ULTRAM Take 1 tablet (50 mg total) by mouth every 6 (six) hours as needed (mild pain).   valsartan-hydrochlorothiazide 320-25 MG tablet Commonly known as: DIOVAN-HCT Take 1 tablet by mouth daily.       Follow-up Information     [name], MD. Call in 1 day(s).   Specialty: Family Medicine Contact  information: [address] [place] [address] [phone]         [name], MD Follow up.   Specialty: Cardiothoracic Surgery Why: Your routine follow-up appointment is on 3/3 @ 11:30am. Please arrive at 11:00am for a routine chest xray located at [facility] which is on the first floor of our building.  Contact information: [address] [place] [address] [phone]            Signed: Elgie Collard 06/18/2019, 8:48 AM DC instructions reviewed with patient  patient examined and medical record reviewed,agree with above note. Tharon Aquas Trigt III 06/18/2019

## 2019-06-16 NOTE — Telephone Encounter (Signed)
Spoke with pt. He is aware of Dr. Judson Roch response. Nothing further was needed.

## 2019-06-16 NOTE — Progress Notes (Addendum)
      [address]       ,[place] [address]             [phone]      2 Days Post-Op Procedure(s) (LRB): VIDEO ASSISTED THORACOSCOPY WITH RIGHT UPPER LOBE WEDGE (Right) STAPLING OF BLEBS (Right) PLEURADESIS USING TALC (Right) Subjective: No issues overnight. He feels like he needs to use the bathroom but has not gone yet. Some pain over the chest tube area that started about 3am.   Objective: Vital signs in last 24 hours: Temp:  [98.1 F (36.7 C)-98.3 F (36.8 C)] 98.1 F (36.7 C) (02/17 0335) Pulse Rate:  [77-79] 78 (02/17 0335) Cardiac Rhythm: Normal sinus rhythm (02/17 0335) Resp:  [10-16] 15 (02/17 0335) BP: (117-144)/(59-73) 117/73 (02/17 0335) SpO2:  [95 %-100 %] 99 % (02/17 0335)     Intake/Output from previous day: 02/16 0701 - 02/17 0700 In: 1322.6 [P.O.:480; I.V.:842.6] Out: 1815 [Urine:1625; Chest Tube:190] Intake/Output this shift: No intake/output data recorded.  General appearance: alert, cooperative and no distress Heart: regular rate and rhythm, S1, S2 normal, no murmur, click, rub or gallop Lungs: clear to auscultation bilaterally Abdomen: soft, non-tender; bowel sounds normal; no masses,  no organomegaly and hyperactive bowel sounds Extremities: extremities normal, atraumatic, no cyanosis or edema Wound: clean and dry  Lab Results: Recent Labs    06/15/19 0350 06/16/19 0234  WBC 12.8* 8.1  HGB 13.0 11.6*  HCT 38.7* 34.9*  PLT 258 198   BMET:  Recent Labs    06/15/19 0350 06/16/19 0234  NA 130* 132*  K 5.0 4.6  CL 98 100  CO2 22 22  GLUCOSE 136* 100*  BUN 20 20  CREATININE 1.31* 1.08  CALCIUM 8.6* 8.5*    PT/INR:  Recent Labs    06/14/19 1100  LABPROT 13.0  INR 1.0   ABG    Component Value Date/Time   PHART 7.397 06/15/2019 0505   HCO3 24.3 06/15/2019 0505   TCO2 26 06/14/2019 1232   ACIDBASEDEF 0.0 06/15/2019 0505   O2SAT 96.1 06/15/2019 0505   CBG (last 3)  Recent Labs    06/15/19 2349  06/16/19 0334 06/16/19 0732  GLUCAP 92 100* 87    Assessment/Plan: S/P Procedure(s) (LRB): VIDEO ASSISTED THORACOSCOPY WITH RIGHT UPPER LOBE WEDGE (Right) STAPLING OF BLEBS (Right) PLEURADESIS USING TALC (Right)  1. CV-NSR in the 70s, BP well controlled. Continue Avapro, Norvasc, and ASA 2. Pulm-Change chest tube to water seal today. 190cc/24 hours. Chest xray stable. 3. Renal-creatinine 1.08, electrolytes okay 4. H and H 11.6/34.9, expected acute blood loss anemia 5. Endo-blood glucose well controlled 6. Continue on-Q pump and one more day. Continue Tramadol and Fentanyl IV  Plan: Likely chest tube can be removed tomorrow if chest xray is stable. Discontinue PCA pump. Ambulate in the halls today. Possibly home Friday.    LOS: 6 days    Elgie Collard 06/16/2019  Today's chest x-ray reviewed and patient examined Agree with above note-DC chest tube tomorrow and home Friday. patient examined and medical record reviewed,agree with above note. Tharon Aquas Trigt III 06/16/2019

## 2019-06-17 ENCOUNTER — Inpatient Hospital Stay (HOSPITAL_COMMUNITY): Payer: Medicare HMO

## 2019-06-17 LAB — GLUCOSE, CAPILLARY
Glucose-Capillary: 103 mg/dL — ABNORMAL HIGH (ref 70–99)
Glucose-Capillary: 93 mg/dL (ref 70–99)
Glucose-Capillary: 92 mg/dL (ref 70–99)
Glucose-Capillary: 126 mg/dL — ABNORMAL HIGH (ref 70–99)
Glucose-Capillary: 139 mg/dL — ABNORMAL HIGH (ref 70–99)

## 2019-06-17 MED ORDER — SORBITOL 70 % PO SOLN
60.0000 mL | Freq: Once | ORAL | Status: AC
  Administered 2019-06-17: 17:00:00 60 mL via ORAL
  Filled 2019-06-17 (×2): qty 60

## 2019-06-17 NOTE — Progress Notes (Addendum)
      [address]       [place] [address]             [phone]      3 Days Post-Op Procedure(s) (LRB): VIDEO ASSISTED THORACOSCOPY WITH RIGHT UPPER LOBE WEDGE (Right) STAPLING OF BLEBS (Right) PLEURADESIS USING TALC (Right) Subjective: No issues overnight. His pain is well controlled.   Objective: Vital signs in last 24 hours: Temp:  [97.9 F (36.6 C)-98.8 F (37.1 C)] 97.9 F (36.6 C) (02/18 0642) Pulse Rate:  [43-95] 78 (02/18 0642) Cardiac Rhythm: Normal sinus rhythm (02/18 0705) Resp:  [11-24] 12 (02/18 0642) BP: (124-142)/(58-77) 142/72 (02/18 0642) SpO2:  [91 %-100 %] 99 % (02/18 0642)     Intake/Output from previous day: 02/17 0701 - 02/18 0700 In: 480 [P.O.:480] Out: 1470 [Urine:1400; Chest Tube:70] Intake/Output this shift: No intake/output data recorded.  General appearance: alert, cooperative and no distress Heart: regular rate and rhythm, S1, S2 normal, no murmur, click, rub or gallop Lungs: clear to auscultation bilaterally Abdomen: soft, non-tender; bowel sounds normal; no masses,  no organomegaly Extremities: extremities normal, atraumatic, no cyanosis or edema Wound: clean and dry   Lab Results: Recent Labs    06/15/19 0350 06/16/19 0234  WBC 12.8* 8.1  HGB 13.0 11.6*  HCT 38.7* 34.9*  PLT 258 198   BMET:  Recent Labs    06/15/19 0350 06/16/19 0234  NA 130* 132*  K 5.0 4.6  CL 98 100  CO2 22 22  GLUCOSE 136* 100*  BUN 20 20  CREATININE 1.31* 1.08  CALCIUM 8.6* 8.5*    PT/INR:  Recent Labs    06/14/19 1100  LABPROT 13.0  INR 1.0   ABG    Component Value Date/Time   PHART 7.397 06/15/2019 0505   HCO3 24.3 06/15/2019 0505   TCO2 26 06/14/2019 1232   ACIDBASEDEF 0.0 06/15/2019 0505   O2SAT 96.1 06/15/2019 0505   CBG (last 3)  Recent Labs    06/16/19 1105 06/16/19 1655 06/17/19 0603  GLUCAP 120* 103* 93    Assessment/Plan: S/P Procedure(s) (LRB): VIDEO ASSISTED THORACOSCOPY WITH RIGHT UPPER  LOBE WEDGE (Right) STAPLING OF BLEBS (Right) PLEURADESIS USING TALC (Right)    1. CV-NSR in the 70s, BP well controlled. Continue Avapro, Norvasc, and ASA 2. Pulm-On water seal. 70cc/24 hours. Chest xray stable. 3. Renal-creatinine 1.08, electrolytes okay. No new labs.  4. H and H 11.6/34.9, expected acute blood loss anemia 5. Endo-blood glucose well controlled 6. Continue Tramadol and Fentanyl IV  Plan: Chest xray is stable and no air leak just  Fluid wave on exam. Will discontinue chest tube this morning. CXR in the AM. Hopefully home tomorrow if he remains stable. No bowel movement-working on that today.   LOS: 7 days    Elgie Collard 06/17/2019   patient examined and medical record reviewed,agree with above note. Tharon Aquas Trigt III 06/17/2019

## 2019-06-18 ENCOUNTER — Inpatient Hospital Stay (HOSPITAL_COMMUNITY): Payer: Medicare HMO

## 2019-06-18 LAB — GLUCOSE, CAPILLARY: Glucose-Capillary: 93 mg/dL (ref 70–99)

## 2019-06-18 MED ORDER — ACETAMINOPHEN 500 MG PO TABS: 1000.0000 mg | ORAL_TABLET | Freq: Four times a day (QID) | ORAL | 0 refills | Status: AC

## 2019-06-18 MED ORDER — TRAMADOL HCL 50 MG PO TABS: 50.0000 mg | ORAL_TABLET | Freq: Four times a day (QID) | ORAL | 0 refills | Status: DC | PRN

## 2019-06-18 NOTE — Plan of Care (Signed)

## 2019-06-18 NOTE — Discharge Instructions (Signed)
Discharge Instructions:  1. You may shower, please wash incisions daily with soap and water and keep dry.  If you wish to cover wounds with dressing you may do so but please keep clean and change daily.  No tub baths or swimming until incisions have completely healed.  If your incisions become red or develop any drainage please call our office at [phone]  2. No Driving until cleared by our office and you are no longer using narcotic pain medications  3. Monitor your weight daily.. Please use the same scale and weigh at same time... If you gain 3-5 lbs in 48 hours with associated lower extremity swelling, please contact our office at [phone]  4. Fever of 101.5 for at least 24 hours, please contact our office at [phone]  5. Activity- up as tolerated, please walk at least 3 times per day.  Avoid strenuous activity, no lifting, pushing, or pulling with your arms over 8-10 lbs for a minimum of 6 weeks  6. If any questions or concerns arise, please do not hesitate to contact our office at [phone]    Thoracoscopy, Care After This sheet gives you information about how to care for yourself after your procedure. Your health care provider may also give you more specific instructions. If you have problems or questions, contact your health care provider. What can I expect after the procedure? After the procedure, it is common to have pain and soreness in the surgical area. Follow these instructions at home: Incision care   Follow instructions from your health care provider about how to take care of your incision. Make sure you: ? Wash your hands with soap and water before you change your bandage (dressing). If soap and water are not available, use hand sanitizer. ? Change your dressing as told by your health care provider. ? Leave stitches (sutures), skin glue, or adhesive strips in place. These skin closures may need to stay in place for 2 weeks or longer. If adhesive strip edges  start to loosen and curl up, you may trim the loose edges. Do not remove adhesive strips completely unless your health care provider tells you to do that.  Check your incision areas every day for signs of infection. Check for: ? Redness, swelling, or pain. ? Fluid or blood. ? Warmth. ? Pus or a bad smell.  Do not take baths, swim, or use a hot tub until your health care provider approves. You may take showers. Medicines  Take over-the-counter and prescription medicines only as told by your health care provider.  If you were prescribed an antibiotic medicine, take it as told by your health care provider. Do not stop taking the antibiotic even if you start to feel better.  Do not drive or use heavy machinery while taking prescription pain medicine.  If you are taking prescription pain medicine, take actions to prevent or treat constipation. Your health care provider may recommend that you: ? Drink enough fluid to keep your urine pale yellow. ? Eat foods that are high in fiber, such as fresh fruits and vegetables, whole grains, and beans. ? Limit foods that are high in fat and processed sugars, such as fried and sweet foods. ? Take an over-the-counter or prescription medicine for constipation. Managing pain, stiffness, and swelling   If directed, put ice on the affected area: ? Put ice in a plastic bag. ? Place a towel between your skin and the bag. ? Leave the ice on for 20 minutes, 2-3 times a  day. Preventing lung infection  To prevent pneumonia and to keep your lungs healthy: ? Try to cough often. If it hurts to cough, hold a pillow against your chest as you cough. ? Take deep breaths or do breathing exercises as instructed by your health care provider. ? If you were given an incentive spirometer, use it as directed by your health care provider. General instructions  Do not lift anything that is heavier than 10 lb (4.5 kg), or the limit that you are told, until your health care  provider says that it is safe.  Do not use any products that contain nicotine or tobacco, such as cigarettes and e-cigarettes. These can delay healing after surgery. If you need help quitting, ask your health care provider.  Avoid driving until your health care provider approves.  If you have a chest drainage tube, care for it as instructed by your health care provider. Do not travel by airplane after the chest drainage tube is removed until your health care provider approves.  Keep all follow-up visits as told by your health care provider. This is important. Contact a health care provider if:  You have a fever.  Pain medicines do not ease your pain.  You have redness, swelling, or increasing pain in your incision area.  You develop a cough that does not go away, or you are coughing up mucus that is yellow or green. Get help right away if:  You have fluid, blood, or pus coming from your incision.  There is a bad smell coming from your incision or dressing.  You develop a rash.  You cough up blood.  You develop light-headedness, or you feel faint.  You have difficulty breathing.  You develop chest pain.  Your heartbeat feels irregular or very fast. These symptoms may represent a serious problem that is an emergency. Do not wait to see if the symptoms will go away. Get medical help right away. Call your local emergency services (911 in the [place] Do not drive yourself to the hospital. Summary  Follow instructions from your health care provider about how to take care of your incision.  Do not drive or use heavy machinery while taking prescription pain medicine.  Leave stitches (sutures), skin glue, or adhesive strips in place.  Check your incision areas every day for signs of infection. This information is not intended to replace advice given to you by your health care provider. Make sure you discuss any questions you have with your health care provider. Document Revised:  03/28/2017 Document Reviewed: 03/25/2017 Elsevier Patient Education  2020 [employer].

## 2019-06-18 NOTE — Progress Notes (Signed)
Pt discharged home, instructions provided to Pt, Pt taken to front of hospital with staff via w/c

## 2019-06-18 NOTE — Care Management Important Message (Signed)
Important Message  Patient Details  Name: [name] MRN: [id number] Date of Birth: June 03, 1940   Medicare Important Message Given:  Yes     Memory Argue 06/18/2019, 12:24 PM    UNABLE TO GIVE IM BEFORE DISCHARGE. IM MAILED TO PATIENT

## 2019-06-18 NOTE — Progress Notes (Signed)
      [address]       [place] [address]             [phone]      4 Days Post-Op Procedure(s) (LRB): VIDEO ASSISTED THORACOSCOPY WITH RIGHT UPPER LOBE WEDGE (Right) STAPLING OF BLEBS (Right) PLEURADESIS USING TALC (Right) Subjective: Feels okay this morning. Was up some of the night having bowel movements. Tired this morning.   Objective: Vital signs in last 24 hours: Temp:  [98 F (36.7 C)-99.8 F (37.7 C)] 98 F (36.7 C) (02/19 0815) Pulse Rate:  [74-93] 81 (02/19 0815) Cardiac Rhythm: Normal sinus rhythm;Heart block (02/19 0701) Resp:  [13-20] 13 (02/19 0815) BP: (119-149)/(62-73) 132/64 (02/19 0815) SpO2:  [96 %-100 %] 99 % (02/19 0815)     Intake/Output from previous day: 02/18 0701 - 02/19 0700 In: 1080 [P.O.:1080] Out: 1050 [Urine:1050] Intake/Output this shift: No intake/output data recorded.  General appearance: alert, cooperative and no distress Heart: regular rate and rhythm, S1, S2 normal, no murmur, click, rub or gallop Lungs: clear to auscultation bilaterally Abdomen: soft, non-tender; bowel sounds normal; no masses,  no organomegaly Extremities: extremities normal, atraumatic, no cyanosis or edema Wound: clean and dry  Lab Results: Recent Labs    06/16/19 0234  WBC 8.1  HGB 11.6*  HCT 34.9*  PLT 198   BMET:  Recent Labs    06/16/19 0234  NA 132*  K 4.6  CL 100  CO2 22  GLUCOSE 100*  BUN 20  CREATININE 1.08  CALCIUM 8.5*    PT/INR: No results for input(s): LABPROT, INR in the last 72 hours. ABG    Component Value Date/Time   PHART 7.397 06/15/2019 0505   HCO3 24.3 06/15/2019 0505   TCO2 26 06/14/2019 1232   ACIDBASEDEF 0.0 06/15/2019 0505   O2SAT 96.1 06/15/2019 0505   CBG (last 3)  Recent Labs    06/17/19 1618 06/17/19 2125 06/18/19 0641  GLUCAP 126* 139* 93    Assessment/Plan: S/P Procedure(s) (LRB): VIDEO ASSISTED THORACOSCOPY WITH RIGHT UPPER LOBE WEDGE (Right) STAPLING OF BLEBS  (Right) PLEURADESIS USING TALC (Right)  1. CV-NSR in the 70s, BP well controlled. Continue Avapro, Norvasc, and ASA 2. Pulm-Chest tube removed yesterday. CXR this morning stable without pneumo. 3. Renal-creatinine 1.08, electrolytes okay 4. H and H 11.6/34.9, expected acute blood loss anemia 5. Endo-blood glucose well controlled   Plan: Home today. Instructions discussed and questions answered.  ++ bowel movement last night/this morning.   LOS: 8 days    Elgie Collard 06/18/2019

## 2019-06-21 NOTE — Telephone Encounter (Signed)
Spoke with the pt and scheduled for HFU on 07/05/19 at 3 pm with [place].

## 2019-06-22 DIAGNOSIS — Z6830 Body mass index (BMI) 30.0-30.9, adult: Secondary | ICD-10-CM | POA: Diagnosis not present

## 2019-06-22 DIAGNOSIS — M0579 Rheumatoid arthritis with rheumatoid factor of multiple sites without organ or systems involvement: Secondary | ICD-10-CM | POA: Diagnosis not present

## 2019-06-22 DIAGNOSIS — J849 Interstitial pulmonary disease, unspecified: Secondary | ICD-10-CM | POA: Diagnosis not present

## 2019-06-22 DIAGNOSIS — M7989 Other specified soft tissue disorders: Secondary | ICD-10-CM | POA: Diagnosis not present

## 2019-06-22 DIAGNOSIS — E669 Obesity, unspecified: Secondary | ICD-10-CM | POA: Diagnosis not present

## 2019-06-25 DIAGNOSIS — J939 Pneumothorax, unspecified: Secondary | ICD-10-CM | POA: Diagnosis not present

## 2019-06-29 ENCOUNTER — Other Ambulatory Visit: Payer: Self-pay | Admitting: Cardiothoracic Surgery

## 2019-06-29 DIAGNOSIS — J939 Pneumothorax, unspecified: Secondary | ICD-10-CM

## 2019-06-30 ENCOUNTER — Ambulatory Visit
Admission: RE | Admit: 2019-06-30 | Discharge: 2019-06-30 | Disposition: A | Discharge status: Home / Self Care | Payer: Medicare HMO | Source: Ambulatory Visit | Attending: Cardiothoracic Surgery | Admitting: Cardiothoracic Surgery

## 2019-06-30 ENCOUNTER — Other Ambulatory Visit: Payer: Self-pay

## 2019-06-30 ENCOUNTER — Ambulatory Visit (INDEPENDENT_AMBULATORY_CARE_PROVIDER_SITE_OTHER): Payer: Self-pay | Admitting: Cardiothoracic Surgery

## 2019-06-30 ENCOUNTER — Encounter: Payer: Self-pay | Admitting: Cardiothoracic Surgery

## 2019-06-30 DIAGNOSIS — Z09 Encounter for follow-up examination after completed treatment for conditions other than malignant neoplasm: Secondary | ICD-10-CM

## 2019-06-30 DIAGNOSIS — J939 Pneumothorax, unspecified: Secondary | ICD-10-CM

## 2019-06-30 DIAGNOSIS — J439 Emphysema, unspecified: Secondary | ICD-10-CM | POA: Diagnosis not present

## 2019-06-30 NOTE — Progress Notes (Signed)
PCP is [name], MD Referring Provider is Laurin Coder, MD  Chief Complaint  Patient presents with  . Routine Post Op    s/p R VATS, wedge, bleb stapling 06/14/19    HPI: First postop visit after right VATS for bleb stapling and pleurodesis after patient presented with his first but persistent right spontaneous pneumothorax.  Pathology on the right lung wedge resections was benign.  The patient has recovered well.  The VATS incisions are well-healed.  He is not requiring any pain medication.  He has some mild post thoracotomy discomfort.  Chest x-ray today shows clear lung fields no pneumothorax.    Past Medical History:  Diagnosis Date  . Adenomatous colon polyp   . Diverticulosis   . GERD (gastroesophageal reflux disease)   . Hemorrhoids    internal and external  . Hiatal hernia   . Hypertension   . Rheumatoid arthritis ([place])   . S/P dilatation of esophageal stricture     Past Surgical History:  Procedure Laterality Date  . CIRCUMCISION    . [place]  2002  . PLEURADESIS Right 06/14/2019   Procedure: PLEURADESIS USING TALC;  Surgeon: [name], MD;  Location: [facility];  Service: Thoracic;  Laterality: Right;  . STAPLING OF BLEBS Right 06/14/2019   Procedure: STAPLING OF BLEBS;  Surgeon: [name], MD;  Location: [facility];  Service: Thoracic;  Laterality: Right;  Marland Kitchen VIDEO ASSISTED THORACOSCOPY Right 06/14/2019   Procedure: VIDEO ASSISTED THORACOSCOPY WITH RIGHT UPPER LOBE WEDGE;  Surgeon: [name], MD;  Location: [facility];  Service: Thoracic;  Laterality: Right;    Family History  Problem Relation Age of Onset  . Heart disease Mother   . Heart disease Father   . Heart disease Brother   . Prostate cancer Paternal Uncle   . Colon cancer Neg Hx   . Throat cancer Neg Hx   . Diabetes Neg Hx   . Kidney disease Neg Hx   . Liver disease Neg Hx     Social History Social History   Tobacco Use  . Smoking status: Never Smoker  . Smokeless  tobacco: Never Used  Substance Use Topics  . Alcohol use: No  . Drug use: No    Current Outpatient Medications  Medication Sig Dispense Refill  . acetaminophen (TYLENOL) 500 MG tablet Take 2 tablets (1,000 mg total) by mouth every 6 (six) hours. 30 tablet 0  . amLODipine (NORVASC) 10 MG tablet Take 1 tablet (10 mg total) by mouth daily. 30 tablet 0  . aspirin EC 81 MG tablet Take 81 mg by mouth daily.    . cetirizine (ZYRTEC) 10 MG tablet Take 10 mg by mouth daily as needed for allergies.    . Menthol-Methyl Salicylate (MUSCLE RUB) 10-15 % CREA Apply 1 application topically as needed for muscle pain (shoulder/neck).    . methotrexate (RHEUMATREX) 2.5 MG tablet Take 10 mg by mouth See admin instructions. Take 10mg  every Thursday and Friday    . montelukast (SINGULAIR) 10 MG tablet Take 10 mg by mouth at bedtime as needed (allergies/sleep).     Marland Kitchen omeprazole (PRILOSEC) 20 MG capsule Take 1 capsule (20 mg total) by mouth daily. Patient needs office visit for further refills!!! 90 capsule 0  . traMADol (ULTRAM) 50 MG tablet Take 1 tablet (50 mg total) by mouth every 6 (six) hours as needed (mild pain). 30 tablet 0  . valsartan-hydrochlorothiazide (DIOVAN-HCT) 320-25 MG tablet Take 1 tablet by mouth daily.  No current facility-administered medications for this visit.    No Known Allergies  Review of Systems  Patient is a non-smoker He is taking his methotrexate now which has controlled his arthritis symptoms No shortness of breath Walking daily No edema No productive cough  BP 128/69 (BP Location: Right Arm)   Pulse 85   Temp (!) 97.2 F (36.2 C) (Oral)   Resp 20   Ht 5\' 7"  (1.702 m)   Wt 206 lb (93.4 kg)   SpO2 97% Comment: RA  BMI 32.26 kg/m  Physical Exam      Exam    General- alert and comfortable.  Surgical incisions well-healed.  Dermabond clean from incisions with peroxide and saline.    Neck- no JVD, no cervical adenopathy palpable, no carotid bruit   Lungs- clear  without rales, wheezes   Cor- regular rate and rhythm, no murmur , gallop   Abdomen- soft, non-tender   Extremities - warm, non-tender, minimal edema   Neuro- oriented, appropriate, no focal weakness   Diagnostic Tests: Chest x-ray performed today viewed and is clear.  Impression: Patient is recovered well following right VATS for spontaneous pneumothorax which involved resection of blebs and pleurodesis.  Plan: Patient can resume driving and normal activities.  He should avoid heavy lifting.  I will see him back next month with a follow-up final chest x-ray.   Len Childs, MD [facility] [phone]

## 2019-07-02 ENCOUNTER — Other Ambulatory Visit: Payer: Self-pay | Admitting: Internal Medicine

## 2019-07-05 ENCOUNTER — Encounter: Payer: Self-pay | Admitting: Pulmonary Disease

## 2019-07-05 ENCOUNTER — Other Ambulatory Visit: Payer: Self-pay

## 2019-07-05 ENCOUNTER — Ambulatory Visit (INDEPENDENT_AMBULATORY_CARE_PROVIDER_SITE_OTHER): Payer: Medicare HMO | Admitting: Pulmonary Disease

## 2019-07-05 VITALS — BP 138/66 | HR 76 | Temp 97.4°F | Ht 68.0 in | Wt 206.0 lb

## 2019-07-05 DIAGNOSIS — J939 Pneumothorax, unspecified: Secondary | ICD-10-CM | POA: Diagnosis not present

## 2019-07-05 DIAGNOSIS — M0510 Rheumatoid lung disease with rheumatoid arthritis of unspecified site: Secondary | ICD-10-CM | POA: Diagnosis not present

## 2019-07-05 NOTE — Patient Instructions (Addendum)
Recent history of pneumothorax for which surgical intervention was needed  Scarring in the lung related to rheumatoid arthritis   I will see you on a yearly basis and we will see you a year from now  I will schedule you for a visit to see Dr. Chase Caller for evaluation of the scarring in the lung to see if there is any other options apart from what is already been done by rheumatology  Call with significant concern

## 2019-07-05 NOTE — Progress Notes (Signed)
[name]    EQ:[id number]    Apr 06, 1941  Primary Care Physician:Bland, Myra Rude, MD  Referring Physician: [name], [address] [place],  [place] [address]  Chief complaint:   Follow-up for pneumothorax He required a VATS and resection for persistent air leak  HPI: Has been doing well Recent chest x-ray shows resolution of pneumothorax  He feels well with no significant complaints today.  Has not had to restrict himself Functioning well  Comes in today for follow-up He does have a history of rheumatoid arthritis He recently followed up with rheumatology and he was advised that he is on optimal treatment at present  Chest x-ray reviewed with the patient and his son during the visit Other family members were on the phone as well   Outpatient Encounter Medications as of 07/05/2019  Medication Sig  . acetaminophen (TYLENOL) 500 MG tablet Take 2 tablets (1,000 mg total) by mouth every 6 (six) hours.  Marland Kitchen amLODipine (NORVASC) 10 MG tablet Take 1 tablet (10 mg total) by mouth daily.  Marland Kitchen aspirin EC 81 MG tablet Take 81 mg by mouth daily.  . cetirizine (ZYRTEC) 10 MG tablet Take 10 mg by mouth daily as needed for allergies.  . Menthol-Methyl Salicylate (MUSCLE RUB) 10-15 % CREA Apply 1 application topically as needed for muscle pain (shoulder/neck).  . methotrexate (RHEUMATREX) 2.5 MG tablet Take 10 mg by mouth See admin instructions. Take 10mg  every Thursday and Friday  . montelukast (SINGULAIR) 10 MG tablet Take 10 mg by mouth at bedtime as needed (allergies/sleep).   Marland Kitchen omeprazole (PRILOSEC) 20 MG capsule TAKE 1 CAPSULE (20 MG TOTAL) BY MOUTH DAILY. PATIENT NEEDS OFFICE VISIT FOR FURTHER REFILLS!!!  . traMADol (ULTRAM) 50 MG tablet Take 1 tablet (50 mg total) by mouth every 6 (six) hours as needed (mild pain).  . valsartan-hydrochlorothiazide (DIOVAN-HCT) 320-25 MG tablet Take 1 tablet by mouth daily.   No facility-administered encounter medications on file as of  07/05/2019.    Allergies as of 07/05/2019  . (No Known Allergies)    Past Medical History:  Diagnosis Date  . Adenomatous colon polyp   . Diverticulosis   . GERD (gastroesophageal reflux disease)   . Hemorrhoids    internal and external  . Hiatal hernia   . Hypertension   . Rheumatoid arthritis ([place])   . S/P dilatation of esophageal stricture     Past Surgical History:  Procedure Laterality Date  . CIRCUMCISION    . [place]  2002  . PLEURADESIS Right 06/14/2019   Procedure: PLEURADESIS USING TALC;  Surgeon: [name], MD;  Location: [facility];  Service: Thoracic;  Laterality: Right;  . STAPLING OF BLEBS Right 06/14/2019   Procedure: STAPLING OF BLEBS;  Surgeon: [name], MD;  Location: [facility];  Service: Thoracic;  Laterality: Right;  Marland Kitchen VIDEO ASSISTED THORACOSCOPY Right 06/14/2019   Procedure: VIDEO ASSISTED THORACOSCOPY WITH RIGHT UPPER LOBE WEDGE;  Surgeon: [name], MD;  Location: [facility];  Service: Thoracic;  Laterality: Right;    Family History  Problem Relation Age of Onset  . Heart disease Mother   . Heart disease Father   . Heart disease Brother   . Prostate cancer Paternal Uncle   . Colon cancer Neg Hx   . Throat cancer Neg Hx   . Diabetes Neg Hx   . Kidney disease Neg Hx   . Liver disease Neg Hx  Social History   Socioeconomic History  . Marital status: Married    Spouse name: Not on file  . Number of children: Not on file  . Years of education: Not on file  . Highest education level: Not on file  Occupational History  . Not on file  Tobacco Use  . Smoking status: Never Smoker  . Smokeless tobacco: Never Used  Substance and Sexual Activity  . Alcohol use: No  . Drug use: No  . Sexual activity: Not on file  Other Topics Concern  . Not on file  Social History Narrative  . Not on file   Social Determinants of Health   Financial Resource Strain:   . Difficulty of Paying Living Expenses: Not on file  Food Insecurity:    . Worried About Charity fundraiser in the Last Year: Not on file  . Ran Out of Food in the Last Year: Not on file  Transportation Needs:   . Lack of Transportation (Medical): Not on file  . Lack of Transportation (Non-Medical): Not on file  Physical Activity:   . Days of Exercise per Week: Not on file  . Minutes of Exercise per Session: Not on file  Stress:   . Feeling of Stress : Not on file  Social Connections:   . Frequency of Communication with Friends and Family: Not on file  . Frequency of Social Gatherings with Friends and Family: Not on file  . Attends Religious Services: Not on file  . Active Member of Clubs or Organizations: Not on file  . Attends Archivist Meetings: Not on file  . Marital Status: Not on file  Intimate Partner Violence:   . Fear of Current or Ex-Partner: Not on file  . Emotionally Abused: Not on file  . Physically Abused: Not on file  . Sexually Abused: Not on file    Review of Systems  Constitutional: Negative.   Respiratory: Negative for cough and shortness of breath.   Cardiovascular: Negative.   Gastrointestinal: Negative.      Vitals:   07/05/19 1505  BP: 138/66  Pulse: 76  Temp: (!) 97.4 F (36.3 C)  SpO2: 95%     Physical Exam  Elderly gentleman does not appear to be in distress Fair air entry bilaterally No rales, no wheezes, no crepitations S1-S2 appreciated Abdomen is soft bowel sounds appreciated  Data Reviewed: Chest x-ray reviewed showing resolution of pneumothorax Recent CT scan of the chest was reviewed with the patient and his daughter  Assessment:  Pulmonary fibrosis secondary to rheumatoid arthritis Pulmonary fibrosis Interstitial lung disease-secondary to rheumatoid arthritis Rheumatoid lung disease  Plan/Recommendations: No changes to current treatment Does not require any inhalers  He appears to be in good health, stable state  Encouraged to call with any significant concerns  I will make  an appointment for him to see Dr. Chase Caller for evaluation of interstitial lung disease I do believe this is mostly related to his rheumatoid arthritis  I will follow him on a yearly basis  Sherrilyn Rist MD [facility] 07/05/2019, 3:37 PM  CC: [name], MD

## 2019-07-15 ENCOUNTER — Other Ambulatory Visit: Payer: Self-pay

## 2019-07-15 ENCOUNTER — Encounter: Payer: Self-pay | Admitting: Internal Medicine

## 2019-07-15 ENCOUNTER — Telehealth: Payer: Self-pay | Admitting: Internal Medicine

## 2019-07-15 ENCOUNTER — Ambulatory Visit (INDEPENDENT_AMBULATORY_CARE_PROVIDER_SITE_OTHER): Payer: Medicare HMO | Admitting: Internal Medicine

## 2019-07-15 VITALS — BP 118/58 | HR 77 | Ht 68.0 in | Wt 206.6 lb

## 2019-07-15 DIAGNOSIS — Z79631 Long term (current) use of antimetabolite agent: Secondary | ICD-10-CM

## 2019-07-15 DIAGNOSIS — J8489 Other specified interstitial pulmonary diseases: Secondary | ICD-10-CM

## 2019-07-15 DIAGNOSIS — M359 Systemic involvement of connective tissue, unspecified: Secondary | ICD-10-CM

## 2019-07-15 DIAGNOSIS — Z79899 Other long term (current) drug therapy: Secondary | ICD-10-CM | POA: Diagnosis not present

## 2019-07-15 DIAGNOSIS — M0510 Rheumatoid lung disease with rheumatoid arthritis of unspecified site: Secondary | ICD-10-CM | POA: Diagnosis not present

## 2019-07-15 DIAGNOSIS — J849 Interstitial pulmonary disease, unspecified: Secondary | ICD-10-CM

## 2019-07-15 NOTE — Telephone Encounter (Signed)
pls ensure followup is 30 min In may 2021

## 2019-07-15 NOTE — Progress Notes (Addendum)
OV 07/15/2019  Subjective:  Patient ID: Wynelle Fanny, male , DOB: [birth year] , age 79 y.o. , MRN: [id number] , ADDRESS: [place] [address]   07/15/2019 -   Chief Complaint  Patient presents with  . Consult    Pt referred by Dr. Ander Slade to see MR for ILD consult. Pt denies any complaints or concerns and states he has been doing fine since last visit.     HPI Jumar Greenstreet 79 y.o.  -patient of Dr. Jenetta Downer.  Presented in January 2021 to the hospital with right-sided pneumothorax that was spontaneous.  He was treated with pleurodesis.  I remember meeting him on 1 day during cross cover at that time.  He tells me that throughout the course of 2020 during the time of the pandemic he was having insidious onset of shortness of breath that was progressive.  Then diagnosed with right-sided pneumothorax.  None after the pleurodesis all the symptoms have completely resolved and is at baseline.  Currently is not having any dyspnea.  However high-resolution CT scan of the chest at that time showed evidence of UIP [I personally visualized and interpreted the CT].  He tells me that he has a diagnosis of rheumatoid arthritis for over 20 years.  And for all this 20 years he has been on methotrexate no other agent.  Previously many years ago was on prednisone but currently not on prednisone.  He feels stable.  No cough  He is accompanied by his daughter [name] who is also giving the history.  Referred now to ILD center  [facility] Comprehensive ILD Questionnaire  Symptoms:  -Shortness of breath is actually resolved after treatment of the pneumothorax.   SYMPTOM SCALE - ILD 07/15/2019   O2 use ra  Shortness of Breath 0 -> 5 scale with 5 being worst (score 6 If unable to do)  At rest 0  Simple tasks - showers, clothes change, eating, shaving 0  Household (dishes, doing bed, laundry) 0  Shopping 0  Walking level at own pace 0  Walking up Stairs 0  Total (30-36) Dyspnea Score  0  How bad is your cough? 0  How bad is your fatigue 0  How bad is nausea 0  How bad is vomiting?  0  How bad is diarrhea? 0  How bad is anxiety? 0  How bad is depression 0       Past Medical History : Positive for rheumatoid arthritis for the last several years.  In fact 20 years.  Treated with methotrexate along.  In the remote past was on prednisone.  Also has symptoms of acid reflux here and there.  Otherwise negative.   ROS: Negative for dyspnea fatigue.  Any collagen vascular.  Positive for acid reflux disease for the last few days.  Also has snoring and early morning headaches excessive daytime somnolence for the last few years.   FAMILY HISTORY of LUNG DISEASE: No one in the family other than him has pulmonary fibrosis or any other lung disease.   EXPOSURE HISTORY: Denies any smoking of cigarettes or marijuana or vaping or electronic cigarettes or electronic vaping.  Denies any cocaine or marijuana use.   HOME and HOBBY DETAILS : [place] last 40 years.  No dampness in the house.  No mold or mildew.  No humidifier no CPAP use.  No steam iron use.  No Jacuzzi use.  No misting Fountain use.  No  pet birds or parakeets.  No pet gerbils.  No feather pillows.  No mold in the [facility] duct.  No music habits.  No guarding habits.  No exposure to birds.   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  With inorganic antigens no exposures.   PULMONARY TOXICITY HISTORY (27 items): Positive for methotrexate for many years.  Possibly 2 decades.    Simple office walk 185 feet x  3 laps goal with forehead probe 07/15/2019   O2 used ra  Number laps completed 3  Comments about pace avg  Resting Pulse Ox/HR 100% and 74/min  Final Pulse Ox/HR 95% and 96/min  Desaturated </= 88% no  Desaturated <= 3% points Yes, 5  Got Tachycardic >/= 90/min yes  Symptoms at end of  test none  Miscellaneous comments x      ROS - per HPI     has a past medical history of Adenomatous colon polyp, Diverticulosis, GERD (gastroesophageal reflux disease), Hemorrhoids, Hiatal hernia, Hypertension, Rheumatoid arthritis ([place]), and S/P dilatation of esophageal stricture.   reports that he has never smoked. He has never used smokeless tobacco.  Past Surgical History:  Procedure Laterality Date  . CIRCUMCISION    . [place]  2002  . PLEURADESIS Right 06/14/2019   Procedure: PLEURADESIS USING TALC;  Surgeon: [name], MD;  Location: [facility];  Service: Thoracic;  Laterality: Right;  . STAPLING OF BLEBS Right 06/14/2019   Procedure: STAPLING OF BLEBS;  Surgeon: [name], MD;  Location: [facility];  Service: Thoracic;  Laterality: Right;  Marland Kitchen VIDEO ASSISTED THORACOSCOPY Right 06/14/2019   Procedure: VIDEO ASSISTED THORACOSCOPY WITH RIGHT UPPER LOBE WEDGE;  Surgeon: [name], MD;  Location: [facility];  Service: Thoracic;  Laterality: Right;    No Known Allergies  Immunization History  Administered Date(s) Administered  . Influenza, High Dose Seasonal PF 01/31/2018, 12/15/2018  . PFIZER SARS-COV-2 Vaccination 05/21/2019, 06/24/2019    Family History  Problem Relation Age of Onset  . Heart disease Mother   . Heart disease Father   . Heart disease Brother   . Prostate cancer Paternal Uncle   . Colon cancer Neg Hx   . Throat cancer Neg Hx   . Diabetes Neg Hx   . Kidney disease Neg Hx   . Liver disease Neg Hx      Current Outpatient Medications:  .  acetaminophen (TYLENOL) 500 MG tablet, Take 2 tablets (1,000 mg total) by mouth every 6 (six) hours., Disp: 30 tablet, Rfl: 0 .  amLODipine (NORVASC) 10 MG tablet, Take 1 tablet (10 mg total) by mouth daily., Disp: 30 tablet, Rfl: 0 .  aspirin EC 81 MG tablet, Take 81 mg by mouth daily., Disp: , Rfl:  .  cetirizine (ZYRTEC) 10 MG tablet, Take 10 mg by mouth daily as needed for allergies., Disp: , Rfl:   .  Menthol-Methyl Salicylate (MUSCLE RUB) 10-15 % CREA, Apply 1 application topically as needed for muscle pain (shoulder/neck)., Disp: , Rfl:  .  methotrexate (RHEUMATREX) 2.5 MG tablet, Take 10 mg by mouth See admin instructions. Take 10mg  every Thursday and Friday, Disp: , Rfl:  .  montelukast (SINGULAIR) 10 MG tablet, Take 10 mg by mouth at bedtime as needed (allergies/sleep). , Disp: , Rfl:  .  omeprazole (PRILOSEC) 20 MG capsule, TAKE 1 CAPSULE (20 MG TOTAL) BY MOUTH DAILY. PATIENT NEEDS OFFICE VISIT FOR FURTHER REFILLS!!!, Disp:  90 capsule, Rfl: 2 .  valsartan-hydrochlorothiazide (DIOVAN-HCT) 320-25 MG tablet, Take 1 tablet by mouth daily., Disp: , Rfl:       Objective:   Vitals:   07/15/19 0932  BP: (!) 118/58  Pulse: 77  SpO2: 98%  Weight: 206 lb 9.6 oz (93.7 kg)  Height: 5\' 8"  (1.727 m)    Estimated body mass index is 31.41 kg/m as calculated from the following:   Height as of this encounter: 5\' 8"  (1.727 m).   Weight as of this encounter: 206 lb 9.6 oz (93.7 kg).  @WEIGHTCHANGE @  [employer]   07/15/19 0932  Weight: 206 lb 9.6 oz (93.7 kg)     Physical Exam  General Appearance:    Alert, cooperative, no distress, appears stated age - yes , Deconditioned looking - no , OBESE  - yes, Sitting on Wheelchair -  no  Head:    Normocephalic, without obvious abnormality, atraumatic  Eyes:    PERRL, conjunctiva/corneas clear,  Ears:    Normal TM's and external ear canals, both ears  Nose:   Nares normal, septum midline, mucosa normal, no drainage    or sinus tenderness. OXYGEN ON  - no . Patient is @ ra   Throat:   Lips, mucosa, and tongue normal; teeth and gums normal. Cyanosis on lips - no  Neck:   Supple, symmetrical, trachea midline, no adenopathy;    thyroid:  no enlargement/tenderness/nodules; no carotid   bruit or JVD  Back:     Symmetric, no curvature, ROM normal, no CVA tenderness  Lungs:     Distress - no , Wheeze no, Barrell Chest - no, Purse lip breathing  - no, Crackles - yes at base   Chest Wall:    No tenderness or deformity.    Heart:    Regular rate and rhythm, S1 and S2 normal, no rub   or gallop, Murmur - no  Breast Exam:    NOT DONE  Abdomen:     Soft, non-tender, bowel sounds active all four quadrants,    no masses, no organomegaly. Visceral obesity - yes  Genitalia:   NOT DONE  Rectal:   NOT DONE  Extremities:   Extremities - normal, Has Cane - no, Clubbing - yes, Edema - no  Pulses:   2+ and symmetric all extremities  Skin:   Stigmata of Connective Tissue Disease - STIGMATA of CONNECTIVE TISSUE DISEASE  - Distal digital fissuring (ie, "mechanic hands") - no - Distal digital tip ulceration - no -Inflammatory arthritis or polyarticular morning joint stiffness ?60 minutes - not any ore - Palmar telangiectasia - no - Raynaud phenomenon - no - Unexplained digital edema - no - Unexplained fixed rash on the digital extensor surfaces (Gottron's sign) - no ... - Deformities of RA - no but joints swollen - Scleroderma  - no - Malar Rash -  no   Lymph nodes:   Cervical, supraclavicular, and axillary nodes normal  Psychiatric:  Neurologic:   Pleasant - yes, Anxious - no, Flat affect - no  CAm-ICU - neg, Alert and Oriented x 3 - yes, Moves all 4s - yes, Speech - normal, Cognition - intact           Assessment:       ICD-10-CM   1. Interstitial lung disease due to connective tissue disease ([place])  J84.89    M35.9   2. Rheumatoid lung disease ([place])  M05.10   3. Methotrexate, long term, current use  Z79.899  My concern is that he he has UIP related pulmonary fibrosis secondary to rheumatoid arthritis.  This is a progressive phenotype.  Even though he might be feeling fine right now after treatment of pneumothorax this can get worse in the future.  Therefore there is a possible role for antifibrotic's.  The good news is that he is stable and well and minimally symptomatic.  Is probably a good idea to restage his lungs with a  high-resolution CT chest.    Plan:     Patient Instructions     ICD-10-CM   1. Interstitial lung disease due to connective tissue disease ([place])  J84.89    M35.9   2. Rheumatoid lung disease ([place])  M05.10   3. 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  Other name for this is rheumatoid arthritis-interstitial lung disease I doubt that the pulmonary issues related to methotrexate even though you have been on this for long-term Glad you are better and improved after recent pneumothorax in January 2021 on the right side.   Plan  -There are many steps in the management of this.  The first thing is to reassess and restage -Do high-resolution CT chest supine and prone in 2 months -Test overnight oxygen study on room air at home anytime in the next few weeks -In the future we need to discuss about antifibrotic therapy depending on the CT scan results -Other steps in management include: Pulmonary rehabilitation, patient support group and clinical trials as a care option [all this to be addressed at future visits over time] -Can ask your rheumatologist for other options other than methotrexate but I do understand methotrexate is working well for joints -  Follow-up -We will call you with overnight oxygen study results -Mid May 2021 but after high-resolution CT chest - face to face 30 min slot  - if UIP pattern discussed antifibrotic's -Consider pulmonary function test at follow-up if stable (currently holding off due to ptx hx)  ( Level 05 visit: Estb 40-54 min   in  visit type: on-site physical face to visit  in total care time and counseling or/and coordination of care by this undersigned MD - Dr Brand Males. This includes one or more of the following on this same day 07/15/2019: pre-charting, chart review, note writing, documentation discussion of test results, diagnostic or treatment recommendations, prognosis, risks and benefits  of management options, instructions, education, compliance or risk-factor reduction. It excludes time spent by the [place] or office staff in the care of the patient. Actual time 56 min)    SIGNATURE    Dr. Brand Males, M.D., F.C.C.P,  Pulmonary and Critical Care Medicine Staff Physician, [place] Director - Interstitial Lung Disease  Program  Pulmonary [place] [facility] [place], [place], [address]  Pager: [phone], If no answer or between  15:00h - 7:00h: call [phone] Telephone: [phone]  10:01 AM 07/15/2019

## 2019-07-15 NOTE — Patient Instructions (Addendum)
ICD-10-CM   1. Interstitial lung disease due to connective tissue disease ([place])  J84.89    M35.9   2. Rheumatoid lung disease ([place])  M05.10   3. 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  Other name for this is rheumatoid arthritis-interstitial lung disease I doubt that the pulmonary issues related to methotrexate even though you have been on this for long-term Glad you are better and improved after recent pneumothorax in January 2021 on the right side.   Plan  -There are many steps in the management of this.  The first thing is to reassess and restage -Do high-resolution CT chest supine and prone in 2 months -Test overnight oxygen study on room air at home anytime in the next few weeks -Do interstitial lung disease questionnaire today and leave it at the front desk today itself. -In the future we need to discuss about antifibrotic therapy depending on the CT scan results -Other steps in management include: Pulmonary rehabilitation, patient support group and clinical trials as a care option [all this to be addressed at future visits over time] -Can ask your rheumatologist for other options other than methotrexate but I do understand methotrexate is working well for joints -Hold off on pulmonary function test at this point in time given COVID-19 disruptions and because of recent pneumothorax  Follow-up -We will call you with overnight oxygen study results -Mid May 2021 but after high-resolution CT chest

## 2019-07-15 NOTE — Telephone Encounter (Signed)
Recall placed. Nothing further needed.

## 2019-07-16 DIAGNOSIS — J849 Interstitial pulmonary disease, unspecified: Secondary | ICD-10-CM | POA: Diagnosis not present

## 2019-07-16 DIAGNOSIS — M0510 Rheumatoid lung disease with rheumatoid arthritis of unspecified site: Secondary | ICD-10-CM | POA: Diagnosis not present

## 2019-07-16 DIAGNOSIS — J939 Pneumothorax, unspecified: Secondary | ICD-10-CM | POA: Diagnosis not present

## 2019-07-20 ENCOUNTER — Encounter: Payer: Self-pay | Admitting: Internal Medicine

## 2019-07-20 DIAGNOSIS — R0902 Hypoxemia: Secondary | ICD-10-CM | POA: Diagnosis not present

## 2019-07-20 DIAGNOSIS — J449 Chronic obstructive pulmonary disease, unspecified: Secondary | ICD-10-CM | POA: Diagnosis not present

## 2019-07-26 DIAGNOSIS — H26491 Other secondary cataract, right eye: Secondary | ICD-10-CM | POA: Diagnosis not present

## 2019-07-26 DIAGNOSIS — H04213 Epiphora due to excess lacrimation, bilateral lacrimal glands: Secondary | ICD-10-CM | POA: Diagnosis not present

## 2019-07-26 DIAGNOSIS — H401222 Low-tension glaucoma, left eye, moderate stage: Secondary | ICD-10-CM | POA: Diagnosis not present

## 2019-07-26 DIAGNOSIS — H401211 Low-tension glaucoma, right eye, mild stage: Secondary | ICD-10-CM | POA: Diagnosis not present

## 2019-07-28 DIAGNOSIS — E785 Hyperlipidemia, unspecified: Secondary | ICD-10-CM | POA: Diagnosis not present

## 2019-07-28 DIAGNOSIS — I10 Essential (primary) hypertension: Secondary | ICD-10-CM | POA: Diagnosis not present

## 2019-07-28 DIAGNOSIS — J849 Interstitial pulmonary disease, unspecified: Secondary | ICD-10-CM | POA: Diagnosis not present

## 2019-07-28 DIAGNOSIS — M069 Rheumatoid arthritis, unspecified: Secondary | ICD-10-CM | POA: Diagnosis not present

## 2019-08-02 DIAGNOSIS — H26491 Other secondary cataract, right eye: Secondary | ICD-10-CM | POA: Diagnosis not present

## 2019-08-03 ENCOUNTER — Other Ambulatory Visit: Payer: Self-pay | Admitting: Cardiothoracic Surgery

## 2019-08-03 DIAGNOSIS — J939 Pneumothorax, unspecified: Secondary | ICD-10-CM

## 2019-08-04 ENCOUNTER — Encounter: Payer: Self-pay | Admitting: Cardiothoracic Surgery

## 2019-08-04 ENCOUNTER — Ambulatory Visit (INDEPENDENT_AMBULATORY_CARE_PROVIDER_SITE_OTHER): Payer: Self-pay | Admitting: Cardiothoracic Surgery

## 2019-08-04 ENCOUNTER — Ambulatory Visit
Admission: RE | Admit: 2019-08-04 | Discharge: 2019-08-04 | Disposition: A | Discharge status: Home / Self Care | Payer: Medicare Other | Source: Ambulatory Visit | Attending: Cardiothoracic Surgery | Admitting: Cardiothoracic Surgery

## 2019-08-04 ENCOUNTER — Encounter: Payer: Medicare HMO | Admitting: Cardiothoracic Surgery

## 2019-08-04 ENCOUNTER — Other Ambulatory Visit: Payer: Self-pay

## 2019-08-04 VITALS — BP 115/60 | HR 82 | Temp 98.1°F | Resp 20 | Ht 68.0 in | Wt 210.0 lb

## 2019-08-04 DIAGNOSIS — Z8709 Personal history of other diseases of the respiratory system: Secondary | ICD-10-CM | POA: Insufficient documentation

## 2019-08-04 DIAGNOSIS — Z09 Encounter for follow-up examination after completed treatment for conditions other than malignant neoplasm: Secondary | ICD-10-CM

## 2019-08-04 DIAGNOSIS — J939 Pneumothorax, unspecified: Secondary | ICD-10-CM

## 2019-08-04 DIAGNOSIS — J849 Interstitial pulmonary disease, unspecified: Secondary | ICD-10-CM | POA: Diagnosis not present

## 2019-08-04 NOTE — Progress Notes (Signed)
PCP is [name], MD Referring Provider is Laurin Coder, MD  Chief Complaint  Patient presents with  . Routine Post Op    1 month f/u with CXR    HPI: Patient returns for final postop check almost 2 months after right VATS with resection of apical blebs for recurrent spontaneous pneumothorax.  He has no symptoms, no pain.  Chest x-ray today was personally reviewed and shows no pneumothorax with underlying chronic interstitial lung disease. On exam vital signs are stable and lungs are clear.  Past Medical History:  Diagnosis Date  . Adenomatous colon polyp   . Diverticulosis   . GERD (gastroesophageal reflux disease)   . Hemorrhoids    internal and external  . Hiatal hernia   . Hypertension   . Rheumatoid arthritis ([place])   . S/P dilatation of esophageal stricture     Past Surgical History:  Procedure Laterality Date  . CIRCUMCISION    . [place]  2002  . PLEURADESIS Right 06/14/2019   Procedure: PLEURADESIS USING TALC;  Surgeon: [name], MD;  Location: [facility];  Service: Thoracic;  Laterality: Right;  . STAPLING OF BLEBS Right 06/14/2019   Procedure: STAPLING OF BLEBS;  Surgeon: [name], MD;  Location: [facility];  Service: Thoracic;  Laterality: Right;  Marland Kitchen VIDEO ASSISTED THORACOSCOPY Right 06/14/2019   Procedure: VIDEO ASSISTED THORACOSCOPY WITH RIGHT UPPER LOBE WEDGE;  Surgeon: [name], MD;  Location: [place];  Service: Thoracic;  Laterality: Right;    Family History  Problem Relation Age of Onset  . Heart disease Mother   . Heart disease Father   . Heart disease Brother   . Prostate cancer Paternal Uncle   . Colon cancer Neg Hx   . Throat cancer Neg Hx   . Diabetes Neg Hx   . Kidney disease Neg Hx   . Liver disease Neg Hx     Social History Social History   Tobacco Use  . Smoking status: Never Smoker  . Smokeless tobacco: Never Used  Substance Use Topics  . Alcohol use: No  . Drug use: No    Current Outpatient Medications   Medication Sig Dispense Refill  . acetaminophen (TYLENOL) 500 MG tablet Take 2 tablets (1,000 mg total) by mouth every 6 (six) hours. 30 tablet 0  . amLODipine (NORVASC) 10 MG tablet Take 1 tablet (10 mg total) by mouth daily. 30 tablet 0  . aspirin EC 81 MG tablet Take 81 mg by mouth daily.    . cetirizine (ZYRTEC) 10 MG tablet Take 10 mg by mouth daily as needed for allergies.    . Menthol-Methyl Salicylate (MUSCLE RUB) 10-15 % CREA Apply 1 application topically as needed for muscle pain (shoulder/neck).    . methotrexate (RHEUMATREX) 2.5 MG tablet Take 10 mg by mouth See admin instructions. Take 10mg  every Thursday and Friday    . montelukast (SINGULAIR) 10 MG tablet Take 10 mg by mouth at bedtime as needed (allergies/sleep).     Marland Kitchen omeprazole (PRILOSEC) 20 MG capsule TAKE 1 CAPSULE (20 MG TOTAL) BY MOUTH DAILY. PATIENT NEEDS OFFICE VISIT FOR FURTHER REFILLS!!! 90 capsule 2  . valsartan-hydrochlorothiazide (DIOVAN-HCT) 320-25 MG tablet Take 1 tablet by mouth daily.     No current facility-administered medications for this visit.    No Known Allergies  Review of Systems  No complaints  BP 115/60 (BP Location: Left Arm, Patient Position: Sitting, Cuff Size: Normal)   Pulse 82   Temp 98.1  F (36.7 C) (Temporal)   Resp 20   Ht 5\' 8"  (1.727 m)   Wt 210 lb (95.3 kg)   SpO2 96% Comment: RA  BMI 31.93 kg/m  Physical Exam      Exam    General- alert and comfortable    Neck- no JVD, no cervical adenopathy palpable, no carotid bruit   Lungs- clear without rales, wheezes   Cor- regular rate and rhythm, no murmur , gallop   Abdomen- soft, non-tender   Extremities - warm, non-tender, minimal edema   Neuro- oriented, appropriate, no focal weakness   Diagnostic Tests: Chest x-ray personally viewed showing no recurrent pneumothorax postop changes  Impression: Right spontaneous pneumothorax resolved after right VATS with resection of apical blebs and pleurodesis  Plan: No further  x-rays are needed.  Patient will return if symptoms recur.   Len Childs, MD [facility] [phone]

## 2019-08-10 ENCOUNTER — Encounter: Payer: Self-pay | Admitting: Allergy and Immunology

## 2019-08-10 ENCOUNTER — Other Ambulatory Visit: Payer: Self-pay

## 2019-08-10 ENCOUNTER — Ambulatory Visit: Payer: Medicare Other | Admitting: Allergy and Immunology

## 2019-08-10 VITALS — BP 122/58 | HR 72 | Temp 97.5°F | Resp 18 | Ht 67.0 in | Wt 208.8 lb

## 2019-08-10 DIAGNOSIS — J3089 Other allergic rhinitis: Secondary | ICD-10-CM

## 2019-08-10 DIAGNOSIS — J301 Allergic rhinitis due to pollen: Secondary | ICD-10-CM

## 2019-08-10 DIAGNOSIS — H1010 Acute atopic conjunctivitis, unspecified eye: Secondary | ICD-10-CM

## 2019-08-10 MED ORDER — CETIRIZINE HCL 10 MG PO TABS: ORAL_TABLET | ORAL | 5 refills | Status: DC

## 2019-08-10 MED ORDER — OLOPATADINE HCL 0.2 % OP SOLN: OPHTHALMIC | 5 refills | Status: DC

## 2019-08-10 MED ORDER — MONTELUKAST SODIUM 10 MG PO TABS: 10.0000 mg | ORAL_TABLET | Freq: Every evening | ORAL | 5 refills | Status: DC

## 2019-08-10 MED ORDER — PREDNISONE 10 MG PO TABS: 10.0000 mg | ORAL_TABLET | Freq: Every day | ORAL | 0 refills | Status: DC

## 2019-08-10 NOTE — Progress Notes (Signed)
[place] [facility] [place]   NEW PATIENT NOTE  Referring Provider: No ref. provider found Primary Provider: [name], MD Date of office visit: 08/10/2019    Subjective:   Chief Complaint:  [name] (DOB: 1941-04-20) is a 79 y.o. male who presents to the clinic on 08/10/2019 with a chief complaint of Allergic Rhinitis  (sneezing daily) .  HPI: [name] presents to this clinic in evaluation of allergies.  He has a multiyear history of having problems with sneezing and nasal congestion that has been a progressive issue and is really been a big problem this spring.  For the past 3 to 4 weeks he has been bothered by this she was issue and it has not responded to his usual treatment with Zyrtec and montelukast.  He does not have any anosmia or headaches or recurrent fevers or ugly nasal discharge or mucoid secretions from his eyes or any lower airway symptoms.  Usually his allergies start in the spring and go until mid summer.  He has been treated with immunotherapy in the past with very good result only to develop relapse after discontinuing this form of treatment.  He informs me that he has had a recent pneumothorax that has required talc pleurodesis in February 2021 and apparently there was emphysema identified on one of his imaging studies.  He does not have any lower airway symptoms such as wheezing and coughing and shortness of breath when he exerts himself.  He is using methotrexate for rheumatoid arthritis with good response.  He has received 2 Covid vaccinations this year.  Past Medical History:  Diagnosis Date  . Adenomatous colon polyp   . Diverticulosis   . GERD (gastroesophageal reflux disease)   . Hemorrhoids    internal and external  . Hiatal hernia   . Hypertension   . Rheumatoid arthritis ([place])   . S/P dilatation of esophageal stricture     Past Surgical History:  Procedure Laterality Date  . CIRCUMCISION    . [place]  2002  . PLEURADESIS Right 06/14/2019   Procedure: PLEURADESIS USING TALC;  Surgeon: [name], MD;  Location: [facility];  Service: Thoracic;  Laterality: Right;  . STAPLING OF BLEBS Right 06/14/2019   Procedure: STAPLING OF BLEBS;  Surgeon: [name], MD;  Location: [facility];  Service: Thoracic;  Laterality: Right;  Marland Kitchen VIDEO ASSISTED THORACOSCOPY Right 06/14/2019   Procedure: VIDEO ASSISTED THORACOSCOPY WITH RIGHT UPPER LOBE WEDGE;  Surgeon: [name], MD;  Location: [facility];  Service: Thoracic;  Laterality: Right;    Allergies as of 08/10/2019   No Known Allergies     Medication List      acetaminophen 500 MG tablet Commonly known as: TYLENOL Take 2 tablets (1,000 mg total) by mouth every 6 (six) hours.   amLODipine 10 MG tablet Commonly known as: NORVASC Take 1 tablet (10 mg total) by mouth daily.   aspirin EC 81 MG tablet Take 81 mg by mouth daily.   cetirizine 10 MG tablet Commonly known as: ZYRTEC Take 10 mg by mouth daily as needed for allergies.   Farxiga 10 MG Tabs tablet Generic drug: dapagliflozin propanediol   methotrexate 2.5 MG tablet Commonly known as: RHEUMATREX Take 10 mg by mouth See admin instructions. Take 10mg  every Thursday and Friday   montelukast 10 MG tablet Commonly known as: SINGULAIR Take 10 mg by mouth at bedtime as needed (allergies/sleep).   Muscle Rub 10-15 %  Crea Apply 1 application topically as needed for muscle pain (shoulder/neck).   omeprazole 20 MG capsule Commonly known as: PRILOSEC TAKE 1 CAPSULE (20 MG TOTAL) BY MOUTH DAILY. PATIENT NEEDS OFFICE VISIT FOR FURTHER REFILLS!!!   valsartan-hydrochlorothiazide 320-25 MG tablet Commonly known as: DIOVAN-HCT Take 1 tablet by mouth daily.       Review of systems negative except as noted in HPI / PMHx or noted below:  Review of Systems  Constitutional: Negative.   HENT: Negative.   Eyes: Negative.   Respiratory: Negative.   Cardiovascular: Negative.     Gastrointestinal: Negative.   Genitourinary: Negative.   Musculoskeletal: Negative.   Skin: Negative.   Neurological: Negative.   Endo/Heme/Allergies: Negative.   Psychiatric/Behavioral: Negative.     Family History  Problem Relation Age of Onset  . Prostate cancer Paternal Uncle   . Colon cancer Neg Hx   . Throat cancer Neg Hx   . Diabetes Neg Hx   . Kidney disease Neg Hx   . Liver disease Neg Hx     Social History   Socioeconomic History  . Marital status: Married    Spouse name: Not on file  . Number of children: Not on file  . Years of education: Not on file  . Highest education level: Not on file  Occupational History  . Not on file  Tobacco Use  . Smoking status: Never Smoker  . Smokeless tobacco: Never Used  Substance and Sexual Activity  . Alcohol use: No  . Drug use: No  . Sexual activity: Not on file  Other Topics Concern  . Not on file  Social History Narrative  . Not on file    Environmental and Social history  Lives in a house with a dry environment, no animals located inside the household, no carpet in the bedroom, no plastic on the bed, no plastic on the pillow, no smoking ongoing with inside the household.  Objective:   Vitals:   08/10/19 0927  BP: (!) 122/58  Pulse: 72  Resp: 18  Temp: (!) 97.5 F (36.4 C)  SpO2: 97%   Height: 5\' 7"  (170.2 cm) Weight: 208 lb 12.8 oz (94.7 kg)  Physical Exam Constitutional:      Appearance: He is not diaphoretic.  HENT:     Head: Normocephalic.     Right Ear: Tympanic membrane, ear canal and external ear normal.     Left Ear: Tympanic membrane, ear canal and external ear normal.     Nose: Nose normal. No mucosal edema or rhinorrhea.     Mouth/Throat:     Pharynx: Uvula midline. No oropharyngeal exudate.  Eyes:     Conjunctiva/sclera: Conjunctivae normal.  Neck:     Thyroid: No thyromegaly.     Trachea: Trachea normal. No tracheal tenderness or tracheal deviation.  Cardiovascular:     Rate  and Rhythm: Normal rate and regular rhythm.     Heart sounds: Normal heart sounds, S1 normal and S2 normal. No murmur.  Pulmonary:     Effort: No respiratory distress.     Breath sounds: Normal breath sounds. No stridor. No wheezing or rales.  Lymphadenopathy:     Head:     Right side of head: No tonsillar adenopathy.     Left side of head: No tonsillar adenopathy.     Cervical: No cervical adenopathy.  Skin:    Findings: No erythema or rash.     Nails: There is no clubbing.  Neurological:  Mental Status: He is alert.     Diagnostics: Allergy skin tests were performed.  He demonstrated hypersensitivity to grasses, weeds, and trees and dust mite.   Assessment and Plan:    1. Perennial allergic rhinitis   2. Seasonal allergic rhinitis due to pollen   3. Seasonal allergic conjunctivitis      1.  Allergen avoidance measures - pollens, dust mite  2.  Treat and prevent inflammation:   A.  Montelukast 10 mg - 1 tablet 1 time per day  B.  Prednisone 10 mg - 1 tablet 1 time per day for 10 days (blood sugars)  3.  If needed:   A.  Zyrtec 10 - 1 tablet 1-2 times per day  B.  Pataday - 1 drop each eye 1 time per day  4.  Immunotherapy?  5.  Return to clinic in 4 weeks or earlier if problem  [name] has rather significant hypersensitivity against pollens and hopefully the plan noted above which includes consistent use of a leukotriene modifier and a very short course of low-dose systemic steroids will get him through this upcoming spring season.  He would definitely be a candidate for immunotherapy if he fails medical treatment.  I will regroup with him in 4 weeks or earlier if there is a problem.  Allena Katz, MD Allergy / Immunology [place]

## 2019-08-10 NOTE — Patient Instructions (Addendum)
  1.  Allergen avoidance measures - pollens, dust mite  2.  Treat and prevent inflammation:   A.  Montelukast 10 mg - 1 tablet 1 time per day  B.  Prednisone 10 mg - 1 tablet 1 time per day for 10 days (blood sugars)  3.  If needed:   A. Zyrtec 10 - 1 tablet 1-2 times per day  B.  Pataday - 1 drop each eye 1 time per day  4.  Immunotherapy?  5.  Return to clinic in 4 weeks or earlier if problem

## 2019-08-11 ENCOUNTER — Encounter: Payer: Self-pay | Admitting: Allergy and Immunology

## 2019-08-12 ENCOUNTER — Telehealth: Payer: Self-pay | Admitting: Internal Medicine

## 2019-08-12 NOTE — Telephone Encounter (Signed)
[name] -had overnight oxygen study on March 13m 2021 - > pulse ox less than or equal to 88% was for 40 minutes and 52 seconds.  Total study duration was 8 hours and 35 minutes.  Plan -Start 1 L nasal cannula oxygen for nocturnal hypoxemia and interstitial lung disease

## 2019-08-13 NOTE — Telephone Encounter (Signed)
Called and spoke with pt letting him know the results of the ONO and that it did qualify him for 1L O2. Pt stated he wanted to hold off on O2 at this time. Pt did not have a f/u scheduled so I have scheduled pt a f/u with MR in May. Nothing further needed.

## 2019-08-25 ENCOUNTER — Telehealth: Payer: Self-pay | Admitting: Allergy and Immunology

## 2019-08-25 DIAGNOSIS — M0579 Rheumatoid arthritis with rheumatoid factor of multiple sites without organ or systems involvement: Secondary | ICD-10-CM | POA: Diagnosis not present

## 2019-08-25 NOTE — Telephone Encounter (Signed)
Will call patient's insurance company and attempt to figure this out for the patient.

## 2019-08-25 NOTE — Telephone Encounter (Signed)
Patient came into the office and states he called his insurance [facility]) and gave them the CPT codes for allergy injections, but they did not know what those codes were. Patient states that they needed more information about the shots.  Please advise.

## 2019-08-26 NOTE — Telephone Encounter (Signed)
Called [employer] and they stated that there was no cost for his injections or for his vials to be made as long as he pays his 35 dollar specialist copay every time he comes in. Called patient and advised. Patient verbalized understanding.

## 2019-09-07 ENCOUNTER — Encounter: Payer: Self-pay | Admitting: Allergy and Immunology

## 2019-09-07 ENCOUNTER — Ambulatory Visit: Payer: Medicare Other | Admitting: Allergy and Immunology

## 2019-09-07 ENCOUNTER — Other Ambulatory Visit: Payer: Self-pay

## 2019-09-07 VITALS — BP 114/58 | HR 72 | Temp 97.3°F | Resp 16 | Wt 209.4 lb

## 2019-09-07 DIAGNOSIS — H1010 Acute atopic conjunctivitis, unspecified eye: Secondary | ICD-10-CM

## 2019-09-07 DIAGNOSIS — J301 Allergic rhinitis due to pollen: Secondary | ICD-10-CM

## 2019-09-07 DIAGNOSIS — J3089 Other allergic rhinitis: Secondary | ICD-10-CM

## 2019-09-07 DIAGNOSIS — J849 Interstitial pulmonary disease, unspecified: Secondary | ICD-10-CM | POA: Diagnosis not present

## 2019-09-07 NOTE — Patient Instructions (Addendum)
  1.  Continue to perform allergen avoidance measures - pollens, dust mite  2.  Continue to treat and prevent inflammation:   A.  Montelukast 10 mg - 1 tablet 1 time per day  3.  If needed:   A.  Zyrtec 10 - 1 tablet 1-2 times per day  B.  Pataday - 1 drop each eye 1 time per day  4.  Consider immunotherapy   5.  Oxygen low for 41 minutes during sleep.  Supplemental oxygen?  6.  Return to clinic in 6 months or earlier if problem

## 2019-09-07 NOTE — Progress Notes (Signed)
[place]   Follow-up Note  Referring Provider: [name], MD Primary Provider: [name], MD Date of Office Visit: 09/07/2019  Subjective:   [name] (DOB: [birth year]) is a 79 y.o. male who returns to the [facility] [place] on 09/07/2019 in re-evaluation of the following:  HPI: [name] returns to this clinic in reevaluation of allergic rhinoconjunctivitis.  His last visit to this clinic was his initial evaluation of 10 August 2019.  Utilizing a combination of allergen avoidance measures and the use of a leukotriene modifier and an antihistamine and Pataday he has really done well regarding his issues.  He feels as though he is much better regarding his allergies.  He uses goggles when he goes outdoors at this point.  He has been told that he should use oxygen at night.  Apparently he has an interstitial lung disease associated with his rheumatoid arthritis.  He does not know the details of his nocturnal oximetry study and he has an appointment to visit with Dr. Chase Caller sometime at the end of this month to further discuss this issue.  Allergies as of 09/07/2019   No Known Allergies     Medication List    acetaminophen 500 MG tablet Commonly known as: TYLENOL Take 2 tablets (1,000 mg total) by mouth every 6 (six) hours.   amLODipine 10 MG tablet Commonly known as: NORVASC Take 1 tablet (10 mg total) by mouth daily.   aspirin EC 81 MG tablet Take 81 mg by mouth daily.   cetirizine 10 MG tablet Commonly known as: ZYRTEC Take 1 tablet 1-2 times daily as needed.   Farxiga 10 MG Tabs tablet Generic drug: dapagliflozin propanediol   methotrexate 2.5 MG tablet Commonly known as: RHEUMATREX Take 10 mg by mouth See admin instructions. Take 10mg  every Thursday and Friday   montelukast 10 MG tablet Commonly known as: SINGULAIR Take 1 tablet (10 mg total) by mouth at bedtime.   Muscle Rub 10-15 % Crea Apply 1  application topically as needed for muscle pain (shoulder/neck).   Olopatadine HCl 0.2 % Soln Commonly known as: Pataday Use 1 drop in each eye once daily as needed.   omeprazole 20 MG capsule Commonly known as: PRILOSEC TAKE 1 CAPSULE (20 MG TOTAL) BY MOUTH DAILY. PATIENT NEEDS OFFICE VISIT FOR FURTHER REFILLS!!!   valsartan-hydrochlorothiazide 320-25 MG tablet Commonly known as: DIOVAN-HCT Take 1 tablet by mouth daily.       Past Medical History:  Diagnosis Date  . Adenomatous colon polyp   . Diverticulosis   . GERD (gastroesophageal reflux disease)   . Hemorrhoids    internal and external  . Hiatal hernia   . Hypertension   . Rheumatoid arthritis ([place])   . S/P dilatation of esophageal stricture     Past Surgical History:  Procedure Laterality Date  . CIRCUMCISION    . [place]  2002  . PLEURADESIS Right 06/14/2019   Procedure: PLEURADESIS USING TALC;  Surgeon: [name], MD;  Location: [place];  Service: Thoracic;  Laterality: Right;  . STAPLING OF BLEBS Right 06/14/2019   Procedure: STAPLING OF BLEBS;  Surgeon: [name], MD;  Location: [place] [facility];  Service: Thoracic;  Laterality: Right;  Marland Kitchen VIDEO ASSISTED THORACOSCOPY Right 06/14/2019   Procedure: VIDEO ASSISTED THORACOSCOPY WITH RIGHT UPPER LOBE WEDGE;  Surgeon: [name], MD;  Location: [place];  Service: Thoracic;  Laterality: Right;    Review of systems negative  except as noted in HPI / PMHx or noted below:  Review of Systems  Constitutional: Negative.   HENT: Negative.   Eyes: Negative.   Respiratory: Negative.   Cardiovascular: Negative.   Gastrointestinal: Negative.   Genitourinary: Negative.   Musculoskeletal: Negative.   Skin: Negative.   Neurological: Negative.   Endo/Heme/Allergies: Negative.   Psychiatric/Behavioral: Negative.      Objective:   Vitals:   09/07/19 0938  BP: (!) 114/58  Pulse: 72  Resp: 16  Temp: (!) 97.3 F (36.3 C)  SpO2: 94%      Weight: 209  lb 6.4 oz (95 kg)   Physical Exam Constitutional:      Appearance: He is not diaphoretic.  HENT:     Head: Normocephalic.     Right Ear: Tympanic membrane, ear canal and external ear normal.     Left Ear: Tympanic membrane, ear canal and external ear normal.     Nose: Nose normal. No mucosal edema or rhinorrhea.     Mouth/Throat:     Pharynx: Uvula midline. No oropharyngeal exudate.  Eyes:     Conjunctiva/sclera: Conjunctivae normal.  Neck:     Thyroid: No thyromegaly.     Trachea: Trachea normal. No tracheal tenderness or tracheal deviation.  Cardiovascular:     Rate and Rhythm: Normal rate and regular rhythm.     Heart sounds: Normal heart sounds, S1 normal and S2 normal. No murmur.  Pulmonary:     Effort: No respiratory distress.     Breath sounds: Normal breath sounds. No stridor. No wheezing (Inspiratory crackles posterior lung fields) or rales.  Lymphadenopathy:     Head:     Right side of head: No tonsillar adenopathy.     Left side of head: No tonsillar adenopathy.     Cervical: No cervical adenopathy.  Skin:    Findings: No erythema or rash.     Nails: There is no clubbing.  Neurological:     Mental Status: He is alert.     Diagnostics: none  Assessment and Plan:   1. Perennial allergic rhinitis   2. Seasonal allergic rhinitis due to pollen   3. Seasonal allergic conjunctivitis   4. ILD (interstitial lung disease) ([place])      1.  Continue to perform allergen avoidance measures - pollens, dust mite  2.  Continue to treat and prevent inflammation:   A.  Montelukast 10 mg - 1 tablet 1 time per day  3.  If needed:   A.  Zyrtec 10 - 1 tablet 1-2 times per day  B.  Pataday - 1 drop each eye 1 time per day  4.  Consider immunotherapy   5.  Oxygen low for 41 minutes during sleep.  Supplemental oxygen?  6.  Return to clinic in 6 months or earlier if problem  From an atopic standpoint [name] is doing quite well at this point.  He will remain on a  leukotriene modifier and perform allergen avoidance measures and utilize some symptomatic medications if required.  If he fails medical therapy he should consider a course of immunotherapy.  He is presently considering this option and informs me today that if he does not do well over the course of the next several months then he will start this form of treatment.  Concerning his oxygen deficit, I did inform him that his oxygen was low for 41 minutes during his sleep on his recent nocturnal oximetry study and he needs to discuss this issue with  Dr. Chase Caller in more detail.  I informed him that his brain and heart and kidneys are set up to exist and operate within a range of the oxygen availability and for 41 minutes of his sleep time oxygen is not available for these organs to operate correctly.  If he does well I will see him back in this clinic in 6 months or earlier if there is a problem.  Allena Katz, MD Allergy / Immunology [place]

## 2019-09-08 ENCOUNTER — Encounter: Payer: Self-pay | Admitting: Allergy and Immunology

## 2019-09-20 DIAGNOSIS — E785 Hyperlipidemia, unspecified: Secondary | ICD-10-CM | POA: Diagnosis not present

## 2019-09-20 DIAGNOSIS — R7309 Other abnormal glucose: Secondary | ICD-10-CM | POA: Diagnosis not present

## 2019-09-20 DIAGNOSIS — I10 Essential (primary) hypertension: Secondary | ICD-10-CM | POA: Diagnosis not present

## 2019-09-21 ENCOUNTER — Ambulatory Visit: Payer: Medicare Other | Admitting: Internal Medicine

## 2019-09-21 DIAGNOSIS — M0579 Rheumatoid arthritis with rheumatoid factor of multiple sites without organ or systems involvement: Secondary | ICD-10-CM | POA: Diagnosis not present

## 2019-09-21 DIAGNOSIS — J849 Interstitial pulmonary disease, unspecified: Secondary | ICD-10-CM | POA: Diagnosis not present

## 2019-09-21 DIAGNOSIS — Z79899 Other long term (current) drug therapy: Secondary | ICD-10-CM | POA: Diagnosis not present

## 2019-09-22 DIAGNOSIS — I10 Essential (primary) hypertension: Secondary | ICD-10-CM | POA: Diagnosis not present

## 2019-09-30 ENCOUNTER — Ambulatory Visit (INDEPENDENT_AMBULATORY_CARE_PROVIDER_SITE_OTHER)
Admission: RE | Admit: 2019-09-30 | Discharge: 2019-09-30 | Disposition: A | Discharge status: Home / Self Care | Payer: Medicare Other | Source: Ambulatory Visit | Attending: Internal Medicine | Admitting: Internal Medicine

## 2019-09-30 ENCOUNTER — Other Ambulatory Visit: Payer: Self-pay

## 2019-09-30 DIAGNOSIS — J849 Interstitial pulmonary disease, unspecified: Secondary | ICD-10-CM

## 2019-09-30 DIAGNOSIS — J479 Bronchiectasis, uncomplicated: Secondary | ICD-10-CM | POA: Diagnosis not present

## 2019-10-01 NOTE — Progress Notes (Signed)
ILD stabl;e/ Will discuss at folowup

## 2019-10-01 NOTE — Progress Notes (Signed)
CT results called to patient.  He verbalized understanding.

## 2019-10-05 ENCOUNTER — Encounter: Payer: Self-pay | Admitting: Internal Medicine

## 2019-10-05 ENCOUNTER — Ambulatory Visit: Payer: Medicare Other | Admitting: Internal Medicine

## 2019-10-05 ENCOUNTER — Other Ambulatory Visit: Payer: Self-pay

## 2019-10-05 VITALS — BP 122/64 | HR 68 | Temp 98.3°F | Ht 67.0 in | Wt 208.2 lb

## 2019-10-05 DIAGNOSIS — M359 Systemic involvement of connective tissue, unspecified: Secondary | ICD-10-CM | POA: Diagnosis not present

## 2019-10-05 DIAGNOSIS — J8489 Other specified interstitial pulmonary diseases: Secondary | ICD-10-CM

## 2019-10-05 DIAGNOSIS — Z79899 Other long term (current) drug therapy: Secondary | ICD-10-CM | POA: Diagnosis not present

## 2019-10-05 DIAGNOSIS — Z79631 Long term (current) use of antimetabolite agent: Secondary | ICD-10-CM

## 2019-10-05 DIAGNOSIS — Z87898 Personal history of other specified conditions: Secondary | ICD-10-CM | POA: Diagnosis not present

## 2019-10-05 NOTE — Patient Instructions (Addendum)
ICD-10-CM   1. Interstitial lung disease due to connective tissue disease ([place])  J84.89    M35.9   2. Methotrexate, long term, current use  Z79.899   3. 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  Other name for this is rheumatoid arthritis-interstitial lung disease I doubt that the pulmonary issues related to methotrexate even though you have been on this for long-term  Glad you are better and improved after recent pneumothorax in January 2021 on the right side.  CT chest is stable JAn 2021 -> June 2021   #Snoring  -  STOP-BANG score is 6. High risk for OSA  Plan - Refer Dr Jaynee Eagles  for possible OSA  - Finish OSA workup and then we can intiaitate anti-fibrotics for ILD based on repeat discussion (per your goals of care - you wish to be as active as you can for longest time possible with or without medications)

## 2019-10-05 NOTE — Progress Notes (Signed)
OV 07/15/2019  Subjective:  Patient ID: [name], male , DOB: [birth year] , age 79 y.o. , MRN: [id number] , ADDRESS: [place] [address]   07/15/2019 -   Chief Complaint  Patient presents with  . Consult    Pt referred by Dr. Ander Slade to see MR for ILD consult. Pt denies any complaints or concerns and states he has been doing fine since last visit.     HPI [name] 79 y.o.  -patient of Dr. Jenetta Downer.  Presented in January 2021 to the hospital with right-sided pneumothorax that was spontaneous.  He was treated with pleurodesis.  I remember meeting him on 1 day during cross cover at that time.  He tells me that throughout the course of 2020 during the time of the pandemic he was having insidious onset of shortness of breath that was progressive.  Then diagnosed with right-sided pneumothorax.  None after the pleurodesis all the symptoms have completely resolved and is at baseline.  Currently is not having any dyspnea.  However high-resolution CT scan of the chest at that time showed evidence of UIP [I personally visualized and interpreted the CT].  He tells me that he has a diagnosis of rheumatoid arthritis for over 20 years.  And for all this 20 years he has been on methotrexate no other agent.  Previously many years ago was on prednisone but currently not on prednisone.  He feels stable.  No cough  He is accompanied by his daughter [name] who is also giving the history.  Referred now to ILD center  [facility] ILD Questionnaire  Symptoms:  -Shortness of breath is actually resolved after treatment of the pneumothorax.   SYMPTOM SCALE - ILD 07/15/2019   O2 use ra  Shortness of Breath 0 -> 5 scale with 5 being worst (score 6 If unable to do)  At rest 0  Simple tasks - showers, clothes change, eating, shaving 0  Household (dishes, doing bed, laundry) 0  Shopping 0  Walking level at own pace 0  Walking up Stairs 0  Total (30-36) Dyspnea Score  0  How bad is your cough? 0  How bad is your fatigue 0  How bad is nausea 0  How bad is vomiting?  0  How bad is diarrhea? 0  How bad is anxiety? 0  How bad is depression 0       Past Medical History : Positive for rheumatoid arthritis for the last several years.  In fact 20 years.  Treated with methotrexate along.  In the remote past was on prednisone.  Also has symptoms of acid reflux here and there.  Otherwise negative.   ROS: Negative for dyspnea fatigue.  Any collagen vascular.  Positive for acid reflux disease for the last few days.  Also has snoring and early morning headaches excessive daytime somnolence for the last few years.   FAMILY HISTORY of LUNG DISEASE: No one in the family other than him has pulmonary fibrosis or any other lung disease.   EXPOSURE HISTORY: Denies any smoking of cigarettes or marijuana or vaping or electronic cigarettes or electronic vaping.  Denies any cocaine or marijuana use.   HOME and HOBBY DETAILS : [place] last 40 [place].  No dampness in the house.  No mold or mildew.  No humidifier no CPAP use.  No steam iron use.  No Jacuzzi use.  No misting Fountain use.  No  pet birds or parakeets.  No pet gerbils.  No feather pillows.  No mold in the [facility] duct.  No music habits.  No guarding habits.  No exposure to birds.   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  With inorganic antigens no exposures.   PULMONARY TOXICITY HISTORY (27 items): Positive for methotrexate for many years.  Possibly 2 decades.    Simple office walk 185 feet x  3 laps goal with forehead probe 07/15/2019   O2 used ra  Number laps completed 3  Comments about pace avg  Resting Pulse Ox/HR 100% and 74/min  Final Pulse Ox/HR 95% and 96/min  Desaturated </= 88% no  Desaturated <= 3% points Yes, 5  Got Tachycardic >/= 90/min yes  Symptoms at end of  test none  Miscellaneous comments x     OV 10/05/2019  Subjective:    Patient ID: [name], male , DOB: 1940/12/08 , age 79 y.o. , MRN: [id number] , ADDRESS: [place] [address]   10/05/2019 -   Chief Complaint  Patient presents with  . Follow-up    Pt states he has been doing good since last visit and denies any complaints.     HPI [name] 79 y.o. -follows up for his UIP with rheumatoid arthritis interstitial lung disease.  Since January 2021 pneumothorax has not had any further issues.  His respiratory symptoms are stable.  He feels his overall dyspnea is stable.  He only has a mild cough.  There seems to be a slight symptom change compared to March 2021 but he states he is stable.  His wife is here with him and I am meeting her for the first time.  We went over the goals of care.  He is very active and likes to be active.  They are on mind taking medications as long as the side effects can be monitored and it is a medical indication/recommendation to do so.  Today the wife has a new complaint and the concern:Wife is concerned about him snoring.  She has seen him intermittently very occasionally stop breathing in the middle of the night.  In the daytime he has excessive fatigue and sleepiness.  His BMI is less than 35.  His neck circumference was not measured but probably less than 40.  He has hypertension.  His stop bang score is 6 high risk for OSA.  SYMPTOM SCALE - ILD 07/15/2019  10/05/2019   O2 use ra ra  Shortness of Breath 0 -> 5 scale with 5 being worst (score 6 If unable to do)   At rest 0 0  Simple tasks - showers, clothes change, eating, shaving 0 0  Household (dishes, doing bed, laundry) 0 0  Shopping 0 0  Walking level at own pace 0 2  Walking up Stairs 0 0  Total (30-36) Dyspnea Score 0 2  How bad is your cough? 0 1  How bad is your fatigue 0 1  How bad is nausea 0 0  How bad is vomiting?  0 0  How bad is diarrhea? 0 0  How bad is anxiety?  0 0  How bad is depression 0 0     Simple office walk 185 feet x  3 laps goal with forehead probe 07/15/2019  10/05/2019   O2 used ra ra  Number laps completed 3 3  Comments about pace avg 100% and 68  Resting Pulse  Ox/HR 100% and 74/min 97% and 93  Final Pulse Ox/HR 95% and 96/min no  Desaturated </= 88% no no  Desaturated <= 3% points Yes, 5 Uyes, 3  Got Tachycardic >/= 90/min yes none  Symptoms at end of test none none  Miscellaneous comments x avg pace   ROS - per HPI  IMPRESSION: CT chest high resoltuon 1. Chronic lung changes compatible with interstitial lung disease, with a spectrum of findings indicative of usual interstitial pneumonia (UIP) per current ATS guidelines. No progression of disease compared to the prior study. 2. Aortic atherosclerosis, in addition to left main and 3 vessel coronary artery disease.  Aortic Atherosclerosis (ICD10-I70.0).   Electronically Signed   By: Vinnie Langton M.D.   On: 09/30/2019 15:50   has a past medical history of Adenomatous colon polyp, Diverticulosis, GERD (gastroesophageal reflux disease), Hemorrhoids, Hiatal hernia, Hypertension, Rheumatoid arthritis ([place]), and S/P dilatation of esophageal stricture.   reports that he has never smoked. He has never used smokeless tobacco.  Past Surgical History:  Procedure Laterality Date  . CIRCUMCISION    . [place]  2002  . PLEURADESIS Right 06/14/2019   Procedure: PLEURADESIS USING TALC;  Surgeon: [name], MD;  Location: [facility];  Service: Thoracic;  Laterality: Right;  . STAPLING OF BLEBS Right 06/14/2019   Procedure: STAPLING OF BLEBS;  Surgeon: [name], MD;  Location: [facility];  Service: Thoracic;  Laterality: Right;  [place] VIDEO ASSISTED THORACOSCOPY Right 06/14/2019   Procedure: VIDEO ASSISTED THORACOSCOPY WITH RIGHT UPPER LOBE WEDGE;  Surgeon: [name], MD;  Location: [facility];  Service: Thoracic;  Laterality: Right;    No Known  Allergies  Immunization History  Administered Date(s) Administered  . Influenza, High Dose Seasonal PF 01/31/2018, 12/15/2018  . PFIZER SARS-COV-2 Vaccination 05/21/2019, 06/24/2019    Family History  Problem Relation Age of Onset  . Prostate cancer Paternal Uncle   . Colon cancer Neg Hx   . Throat cancer Neg Hx   . Diabetes Neg Hx   . Kidney disease Neg Hx   . Liver disease Neg Hx      Current Outpatient Medications:  .  acetaminophen (TYLENOL) 500 MG tablet, Take 2 tablets (1,000 mg total) by mouth every 6 (six) hours., Disp: 30 tablet, Rfl: 0 .  amLODipine (NORVASC) 10 MG tablet, Take 1 tablet (10 mg total) by mouth daily., Disp: 30 tablet, Rfl: 0 .  aspirin EC 81 MG tablet, Take 81 mg by mouth daily., Disp: , Rfl:  .  cetirizine (ZYRTEC) 10 MG tablet, Take 1 tablet 1-2 times daily as needed., Disp: 60 tablet, Rfl: 5 .  FARXIGA 10 MG TABS tablet, , Disp: , Rfl:  .  FOLIC ACID PO, Take 1 tablet by mouth daily., Disp: , Rfl:  .  Menthol-Methyl Salicylate (MUSCLE RUB) 10-15 % CREA, Apply 1 application topically as needed for muscle pain (shoulder/neck)., Disp: , Rfl:  .  methotrexate (RHEUMATREX) 2.5 MG tablet, Take 10 mg by mouth See admin instructions. Take 10mg  every Thursday and Friday, Disp: , Rfl:  .  montelukast (SINGULAIR) 10 MG tablet, Take 1 tablet (10 mg total) by mouth at bedtime., Disp: 30 tablet, Rfl: 5 .  Olopatadine HCl (PATADAY) 0.2 % SOLN, Use 1 drop in each eye once daily as needed., Disp: 2.5 mL, Rfl: 5 .  omeprazole (PRILOSEC) 20 MG capsule, TAKE 1 CAPSULE (20 MG TOTAL) BY MOUTH DAILY. PATIENT NEEDS OFFICE VISIT FOR FURTHER REFILLS!!!, Disp: 90 capsule,  Rfl: 2 .  valsartan-hydrochlorothiazide (DIOVAN-HCT) 320-25 MG tablet, Take 1 tablet by mouth daily., Disp: , Rfl:       Objective:   Vitals:   10/05/19 0930  BP: 122/64  Pulse: 68  Temp: 98.3 F (36.8 C)  TempSrc: Oral  SpO2: 98%  Weight: 208 lb 3.2 oz (94.4 kg)  Height: 5\' 7"  (1.702 m)     Estimated body mass index is 32.61 kg/m as calculated from the following:   Height as of this encounter: 5\' 7"  (1.702 m).   Weight as of this encounter: 208 lb 3.2 oz (94.4 kg).  @WEIGHTCHANGE @  [employer]   10/05/19 0930  Weight: 208 lb 3.2 oz (94.4 kg)     Physical Exam Well-built pleasant male mild bilateral bibasal crackles.  Mild clubbing with rheumatoid arthritis deformities that is mild.  Otherwise nonfocal exam. Assessment:       ICD-10-CM   1. Interstitial lung disease due to connective tissue disease ([place])  J84.89    M35.9   2. Methotrexate, long term, current use  Z79.899   3. History of snoring  Z87.898    He has UIP interstitial lung disease.  This is stable compared to January 2021 but the marker of UIP in a male with rheumatoid arthritis puts him at high risk for progression.  Based on this we discussed antifibrotic's.  Discussed about a wait and watch approach still he declines and then start antifibrotic's or start antifibrotic's currently.  The left the decision to me.  It appears that his main goal of care is to have excellent quality of life and live as long as possible.  Currently he is very functional.  He wants to maintain this.  Currently the main symptom is 1 of sleep apnea and fatigue.  We resolved that the best thing to do right now is to address symptoms of excessive daytime somnolence and to give him a better quality of life if diagnosed with sleep apnea by taking CPAP.  Therefore address this and at the next visit we will discuss and initiate antifibrotic's given the fact he has UIP which is a marker for progression.  He and wife agreeable with the plan    Plan:     Patient Instructions     ICD-10-CM   1. Interstitial lung disease due to connective tissue disease ([place])  J84.89    M35.9   2. Methotrexate, long term, current use  Z79.899   3. 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  Other name for this is rheumatoid arthritis-interstitial lung disease I doubt that the pulmonary issues related to methotrexate even though you have been on this for long-term  Glad you are better and improved after recent pneumothorax in January 2021 on the right side.  CT chest is stable JAn 2021 -> June 2021   #Snoring  -  STOP-BANG score is 6. High risk for OSA  Plan - Refer Dr Jaynee Eagles  for possible OSA  - Finish OSA workup and then we can intiaitate anti-fibrotics for ILD based on repeat discussion (per your goals of care - you wish to be as active as you can for longest time possible with or without medications)     SIGNATURE    Dr. Brand Males, M.D., F.C.C.P,  Pulmonary and Critical Care Medicine Staff Physician, [place] Director - Interstitial Lung Disease  Program  [facility] [employer], [place], [address]  Pager: [phone], If no answer or between  15:00h - 7:00h: call [phone] Telephone: [phone]  10:08 AM 10/05/2019

## 2019-10-27 DIAGNOSIS — E785 Hyperlipidemia, unspecified: Secondary | ICD-10-CM | POA: Diagnosis not present

## 2019-10-27 DIAGNOSIS — J849 Interstitial pulmonary disease, unspecified: Secondary | ICD-10-CM | POA: Diagnosis not present

## 2019-10-27 DIAGNOSIS — M069 Rheumatoid arthritis, unspecified: Secondary | ICD-10-CM | POA: Diagnosis not present

## 2019-10-27 DIAGNOSIS — I10 Essential (primary) hypertension: Secondary | ICD-10-CM | POA: Diagnosis not present

## 2019-11-03 ENCOUNTER — Telehealth: Payer: Self-pay | Admitting: Internal Medicine

## 2019-11-03 MED ORDER — OMEPRAZOLE 20 MG PO CPDR: 20.0000 mg | DELAYED_RELEASE_CAPSULE | Freq: Every day | ORAL | 0 refills | Status: DC

## 2019-11-03 NOTE — Telephone Encounter (Signed)
Pt just made an appt with Dr. Henrene Pastor on 9/1 for meds rf. He states that he will run out of meds this week so is requesting a rf until he sees Dr. Henrene Pastor. He uses [facility] on [address].

## 2019-11-03 NOTE — Telephone Encounter (Signed)
Refilled Omeprazole 

## 2019-11-22 ENCOUNTER — Ambulatory Visit: Payer: Medicare Other | Admitting: Pulmonary Disease

## 2019-11-22 ENCOUNTER — Encounter: Payer: Self-pay | Admitting: Pulmonary Disease

## 2019-11-22 ENCOUNTER — Other Ambulatory Visit: Payer: Self-pay

## 2019-11-22 VITALS — BP 120/52 | HR 77 | Temp 98.0°F | Ht 68.0 in | Wt 211.6 lb

## 2019-11-22 DIAGNOSIS — G4733 Obstructive sleep apnea (adult) (pediatric): Secondary | ICD-10-CM

## 2019-11-22 NOTE — Patient Instructions (Signed)
Moderate probability of significant obstructive sleep apnea  We will schedule you for home sleep study Will update you with results as soon as reviewed  Depending on findings you may need CPAP therapy  Tentatively see you back in the office in about 3 months  Sleep Apnea Sleep apnea affects breathing during sleep. It causes breathing to stop for a short time or to become shallow. It can also increase the risk of:  Heart attack.  Stroke.  Being very overweight (obese).  Diabetes.  Heart failure.  Irregular heartbeat. The goal of treatment is to help you breathe normally again. What are the causes? There are three kinds of sleep apnea:  Obstructive sleep apnea. This is caused by a blocked or collapsed airway.  Central sleep apnea. This happens when the brain does not send the right signals to the muscles that control breathing.  Mixed sleep apnea. This is a combination of obstructive and central sleep apnea. The most common cause of this condition is a collapsed or blocked airway. This can happen if:  Your throat muscles are too relaxed.  Your tongue and tonsils are too large.  You are overweight.  Your airway is too small. What increases the risk?  Being overweight.  Smoking.  Having a small airway.  Being older.  Being male.  Drinking alcohol.  Taking medicines to calm yourself (sedatives or tranquilizers).  Having family members with the condition. What are the signs or symptoms?  Trouble staying asleep.  Being sleepy or tired during the day.  Getting angry a lot.  Loud snoring.  Headaches in the morning.  Not being able to focus your mind (concentrate).  Forgetting things.  Less interest in sex.  Mood swings.  Personality changes.  Feelings of sadness (depression).  Waking up a lot during the night to pee (urinate).  Dry mouth.  Sore throat. How is this diagnosed?  Your medical history.  A physical exam.  A test that is  done when you are sleeping (sleep study). The test is most often done in a sleep lab but may also be done at home. How is this treated?   Sleeping on your side.  Using a medicine to get rid of mucus in your nose (decongestant).  Avoiding the use of alcohol, medicines to help you relax, or certain pain medicines (narcotics).  Losing weight, if needed.  Changing your diet.  Not smoking.  Using a machine to open your airway while you sleep, such as: ? An oral appliance. This is a mouthpiece that shifts your lower jaw forward. ? A CPAP device. This device blows air through a mask when you breathe out (exhale). ? An EPAP device. This has valves that you put in each nostril. ? A BPAP device. This device blows air through a mask when you breathe in (inhale) and breathe out.  Having surgery if other treatments do not work. It is important to get treatment for sleep apnea. Without treatment, it can lead to:  High blood pressure.  Coronary artery disease.  In men, not being able to have an erection (impotence).  Reduced thinking ability. Follow these instructions at home: Lifestyle  Make changes that your doctor recommends.  Eat a healthy diet.  Lose weight if needed.  Avoid alcohol, medicines to help you relax, and some pain medicines.  Do not use any products that contain nicotine or tobacco, such as cigarettes, e-cigarettes, and chewing tobacco. If you need help quitting, ask your doctor. General instructions  Take  over-the-counter and prescription medicines only as told by your doctor.  If you were given a machine to use while you sleep, use it only as told by your doctor.  If you are having surgery, make sure to tell your doctor you have sleep apnea. You may need to bring your device with you.  Keep all follow-up visits as told by your doctor. This is important. Contact a doctor if:  The machine that you were given to use during sleep bothers you or does not seem to  be working.  You do not get better.  You get worse. Get help right away if:  Your chest hurts.  You have trouble breathing in enough air.  You have an uncomfortable feeling in your back, arms, or stomach.  You have trouble talking.  One side of your body feels weak.  A part of your face is hanging down. These symptoms may be an emergency. Do not wait to see if the symptoms will go away. Get medical help right away. Call your local emergency services (911 in the [place] Do not drive yourself to the hospital. Summary  This condition affects breathing during sleep.  The most common cause is a collapsed or blocked airway.  The goal of treatment is to help you breathe normally while you sleep. This information is not intended to replace advice given to you by your health care provider. Make sure you discuss any questions you have with your health care provider. Document Revised: 01/30/2018 Document Reviewed: 12/09/2017 Elsevier Patient Education  [place].

## 2019-11-22 NOTE — Progress Notes (Signed)
[name]    [id number]    1941/04/01  Primary Care Physician:Bland, Myra Rude, MD  Referring Physician: [name], [address] [place],  [place] [id number]  Chief complaint:   Patient is being seen for history of snoring, witnessed apneas  HPI:  According to spouse he is having more episodes of significant snoring and witnessed apneas She has to occasionally not him to roll over She has noted that sometimes she holds his breath and after encouragement to roll over he will to do it again a few minutes  Weight has been stable No previous history of obstructive sleep apnea known  He feels well with no significant symptoms He will take naps on a regular basis for about 15 to 20 minutes at most He feels better following the naps  Usually goes to bed between 1030 and 11 10 to 30 minutes to fall asleep About 3 awakenings Usually up about 8 AM  Weight has been relatively stable  He has a history of hypertension which is well controlled History of dysrhythmias which is well controlled as well  Has a history of rheumatoid arthritis, interstitial lung disease-following with Dr. Chase Caller Past history of spontaneous secondary pneumothorax  Outpatient Encounter Medications as of 11/22/2019  Medication Sig  . acetaminophen (TYLENOL) 500 MG tablet Take 2 tablets (1,000 mg total) by mouth every 6 (six) hours.  Marland Kitchen amLODipine (NORVASC) 10 MG tablet Take 1 tablet (10 mg total) by mouth daily.  Marland Kitchen aspirin EC 81 MG tablet Take 81 mg by mouth daily.  . cetirizine (ZYRTEC) 10 MG tablet Take 1 tablet 1-2 times daily as needed.  Marland Kitchen FARXIGA 10 MG TABS tablet   . FOLIC ACID PO Take 1 tablet by mouth daily.  . Menthol-Methyl Salicylate (MUSCLE RUB) 10-15 % CREA Apply 1 application topically as needed for muscle pain (shoulder/neck).  . methotrexate (RHEUMATREX) 2.5 MG tablet Take 10 mg by mouth See admin instructions. Take 10mg  every Thursday and Friday  . montelukast  (SINGULAIR) 10 MG tablet Take 1 tablet (10 mg total) by mouth at bedtime.  . Olopatadine HCl (PATADAY) 0.2 % SOLN Use 1 drop in each eye once daily as needed.  Marland Kitchen omeprazole (PRILOSEC) 20 MG capsule Take 1 capsule (20 mg total) by mouth daily.  . valsartan-hydrochlorothiazide (DIOVAN-HCT) 320-25 MG tablet Take 1 tablet by mouth daily.   No facility-administered encounter medications on file as of 11/22/2019.    Allergies as of 11/22/2019  . (No Known Allergies)    Past Medical History:  Diagnosis Date  . Adenomatous colon polyp   . Diverticulosis   . GERD (gastroesophageal reflux disease)   . Hemorrhoids    internal and external  . Hiatal hernia   . Hypertension   . Rheumatoid arthritis ([place])   . S/P dilatation of esophageal stricture     Past Surgical History:  Procedure Laterality Date  . CIRCUMCISION    . [place]  2002  . PLEURADESIS Right 06/14/2019   Procedure: PLEURADESIS USING TALC;  Surgeon: [name], MD;  Location: [facility];  Service: Thoracic;  Laterality: Right;  . STAPLING OF BLEBS Right 06/14/2019   Procedure: STAPLING OF BLEBS;  Surgeon: [name], MD;  Location: [facility];  Service: Thoracic;  Laterality: Right;  Marland Kitchen VIDEO ASSISTED THORACOSCOPY Right 06/14/2019   Procedure: VIDEO ASSISTED THORACOSCOPY WITH RIGHT UPPER LOBE WEDGE;  Surgeon: [name], MD;  Location: [facility];  Service: Thoracic;  Laterality: Right;    Family History  Problem Relation Age of Onset  . Prostate cancer Paternal Uncle   . Colon cancer Neg Hx   . Throat cancer Neg Hx   . Diabetes Neg Hx   . Kidney disease Neg Hx   . Liver disease Neg Hx     Social History   Socioeconomic History  . Marital status: Married    Spouse name: Not on file  . Number of children: Not on file  . Years of education: Not on file  . Highest education level: Not on file  Occupational History  . Not on file  Tobacco Use  . Smoking status: Never Smoker  . Smokeless tobacco: Never  Used  Vaping Use  . Vaping Use: Never used  Substance and Sexual Activity  . Alcohol use: No  . Drug use: No  . Sexual activity: Not on file  Other Topics Concern  . Not on file  Social History Narrative  . Not on file   Social Determinants of Health   Financial Resource Strain:   . Difficulty of Paying Living Expenses:   Food Insecurity:   . Worried About Charity fundraiser in the Last Year:   . Arboriculturist in the Last Year:   Transportation Needs:   . Film/video editor (Medical):   Marland Kitchen Lack of Transportation (Non-Medical):   Physical Activity:   . Days of Exercise per Week:   . Minutes of Exercise per Session:   Stress:   . Feeling of Stress :   Social Connections:   . Frequency of Communication with Friends and Family:   . Frequency of Social Gatherings with Friends and Family:   . Attends Religious Services:   . Active Member of Clubs or Organizations:   . Attends Archivist Meetings:   Marland Kitchen Marital Status:   Intimate Partner Violence:   . Fear of Current or Ex-Partner:   . Emotionally Abused:   Marland Kitchen Physically Abused:   . Sexually Abused:     Review of Systems  Constitutional: Negative for activity change and fatigue.  HENT: Negative.   Respiratory: Negative for chest tightness and shortness of breath.   Musculoskeletal: Negative.   Psychiatric/Behavioral: Negative for sleep disturbance.    Vitals:   11/22/19 1003  BP: (!) 120/52  Pulse: 77  Temp: 98 F (36.7 C)  SpO2: 96%     Physical Exam Constitutional:      Appearance: He is obese.  HENT:     Nose: No congestion or rhinorrhea.     Mouth/Throat:     Mouth: Mucous membranes are moist.     Pharynx: No oropharyngeal exudate.     Comments: Mallampati 3, crowded oropharynx Eyes:     General:        Right eye: No discharge.        Left eye: No discharge.     Pupils: Pupils are equal, round, and reactive to light.  Cardiovascular:     Rate and Rhythm: Normal rate and regular  rhythm.     Pulses: Normal pulses.     Heart sounds: Normal heart sounds. No murmur heard.  No friction rub.  Pulmonary:     Effort: Pulmonary effort is normal. No respiratory distress.     Breath sounds: Normal breath sounds. No stridor. No wheezing or rhonchi.  Musculoskeletal:        General: Normal range of motion.  Cervical back: Normal range of motion. No rigidity or tenderness.  Skin:    General: Skin is warm.     Coloration: Skin is not jaundiced.  Neurological:     General: No focal deficit present.     Mental Status: He is alert.  Psychiatric:        Mood and Affect: Mood normal.    Results of the Epworth flowsheet 11/22/2019  Sitting and reading 2  Watching TV 1  Sitting, inactive in a public place (e.g. a theatre or a meeting) 0  As a passenger in a car for an hour without a break 0  Lying down to rest in the afternoon when circumstances permit 3  Sitting and talking to someone 0  Sitting quietly after a lunch without alcohol 1  In a car, while stopped for a few minutes in traffic 0  Total score 7    Data Reviewed: Overnight oximetry on 07/20/2019 reviewed showing about 50 minutes of desaturations  High-resolution CT scan from 09/30/2019 reviewed  Assessment:  Moderate probability of significant obstructive sleep apnea -He at least has mild to moderate obstructive sleep apnea based on witnessed events  Obesity  Pathophysiology of sleep disordered breathing discussed  Treatment options for sleep disordered breathing discussed  Plan/Recommendations: Schedule patient for home sleep study  Continue graded exercises to keep weight in check  Risk of untreated sleep disordered breathing discussed   Sherrilyn Rist MD [facility] 11/22/2019, 10:25 AM  CC: [name], MD

## 2019-11-26 DIAGNOSIS — I10 Essential (primary) hypertension: Secondary | ICD-10-CM | POA: Diagnosis not present

## 2019-11-26 DIAGNOSIS — J849 Interstitial pulmonary disease, unspecified: Secondary | ICD-10-CM | POA: Diagnosis not present

## 2019-11-26 DIAGNOSIS — E785 Hyperlipidemia, unspecified: Secondary | ICD-10-CM | POA: Diagnosis not present

## 2019-11-26 DIAGNOSIS — M069 Rheumatoid arthritis, unspecified: Secondary | ICD-10-CM | POA: Diagnosis not present

## 2019-11-28 DIAGNOSIS — J189 Pneumonia, unspecified organism: Secondary | ICD-10-CM

## 2019-11-28 HISTORY — DX: Pneumonia, unspecified organism: J18.9

## 2019-11-29 ENCOUNTER — Ambulatory Visit: Payer: Medicare Other

## 2019-11-29 ENCOUNTER — Other Ambulatory Visit: Payer: Self-pay

## 2019-11-29 DIAGNOSIS — G4733 Obstructive sleep apnea (adult) (pediatric): Secondary | ICD-10-CM | POA: Diagnosis not present

## 2019-11-30 ENCOUNTER — Encounter (HOSPITAL_COMMUNITY): Payer: Self-pay

## 2019-11-30 ENCOUNTER — Other Ambulatory Visit: Payer: Self-pay

## 2019-11-30 ENCOUNTER — Emergency Department (HOSPITAL_COMMUNITY)
Admission: EM | Admit: 2019-11-30 | Discharge: 2019-12-01 | Disposition: A | Discharge status: Home / Self Care | Payer: Medicare Other | Attending: Emergency Medicine | Admitting: Emergency Medicine

## 2019-11-30 DIAGNOSIS — R05 Cough: Secondary | ICD-10-CM | POA: Diagnosis not present

## 2019-11-30 DIAGNOSIS — R5383 Other fatigue: Secondary | ICD-10-CM | POA: Insufficient documentation

## 2019-11-30 DIAGNOSIS — Z5321 Procedure and treatment not carried out due to patient leaving prior to being seen by health care provider: Secondary | ICD-10-CM | POA: Insufficient documentation

## 2019-11-30 LAB — BASIC METABOLIC PANEL
Anion gap: 11 (ref 5–15)
BUN: 37 mg/dL — ABNORMAL HIGH (ref 8–23)
CO2: 21 mmol/L — ABNORMAL LOW (ref 22–32)
Calcium: 8.5 mg/dL — ABNORMAL LOW (ref 8.9–10.3)
Chloride: 103 mmol/L (ref 98–111)
Creatinine, Ser: 1.84 mg/dL — ABNORMAL HIGH (ref 0.61–1.24)
GFR calc Af Amer: 39 mL/min — ABNORMAL LOW (ref >60)
GFR calc non Af Amer: 34 mL/min — ABNORMAL LOW (ref >60)
Glucose, Bld: 120 mg/dL — ABNORMAL HIGH (ref 70–99)
Potassium: 4.8 mmol/L (ref 3.5–5.1)
Sodium: 135 mmol/L (ref 135–145)

## 2019-11-30 LAB — CBC
HCT: 38.7 % — ABNORMAL LOW (ref 39.0–52.0)
Hemoglobin: 12.7 g/dL — ABNORMAL LOW (ref 13.0–17.0)
MCH: 30.8 pg (ref 26.0–34.0)
MCHC: 32.8 g/dL (ref 30.0–36.0)
MCV: 93.9 fL (ref 80.0–100.0)
Platelets: 266 10*3/uL (ref 150–400)
RBC: 4.12 MIL/uL — ABNORMAL LOW (ref 4.22–5.81)
RDW: 16.8 % — ABNORMAL HIGH (ref 11.5–15.5)
WBC: 8.7 10*3/uL (ref 4.0–10.5)
nRBC: 0 % (ref 0.0–0.2)

## 2019-11-30 MED ORDER — SODIUM CHLORIDE 0.9% FLUSH: 3.0000 mL | Freq: Once | INTRAVENOUS | Status: DC

## 2019-11-30 NOTE — ED Triage Notes (Signed)
Patient states he took a pneumonia vaccine the week before last and since has had fatigue, feeling horse, and a dry cough.

## 2019-12-01 ENCOUNTER — Telehealth: Payer: Self-pay | Admitting: Pulmonary Disease

## 2019-12-01 ENCOUNTER — Ambulatory Visit (INDEPENDENT_AMBULATORY_CARE_PROVIDER_SITE_OTHER): Payer: Medicare Other

## 2019-12-01 ENCOUNTER — Ambulatory Visit
Admission: EM | Admit: 2019-12-01 | Discharge: 2019-12-01 | Disposition: A | Discharge status: Home / Self Care | Payer: Medicare Other | Attending: Emergency Medicine | Admitting: Emergency Medicine

## 2019-12-01 DIAGNOSIS — J189 Pneumonia, unspecified organism: Secondary | ICD-10-CM

## 2019-12-01 DIAGNOSIS — R5383 Other fatigue: Secondary | ICD-10-CM | POA: Diagnosis not present

## 2019-12-01 DIAGNOSIS — R05 Cough: Secondary | ICD-10-CM | POA: Diagnosis not present

## 2019-12-01 MED ORDER — AZITHROMYCIN 250 MG PO TABS
250.0000 mg | ORAL_TABLET | Freq: Every day | ORAL | 0 refills | Status: DC
Start: 2019-12-01 — End: 2019-12-29

## 2019-12-01 MED ORDER — AMOXICILLIN 500 MG PO CAPS
500.0000 mg | ORAL_CAPSULE | Freq: Three times a day (TID) | ORAL | 0 refills | Status: DC
Start: 2019-12-01 — End: 2019-12-29

## 2019-12-01 NOTE — Discharge Instructions (Signed)
Take antibiotics as discussed. Very important follow-up with your primary care next week. Drink plenty fluids. Very important that you go to the emergency room if you develop worsening fatigue, change in urination, chest pain, or difficulty breathing.

## 2019-12-01 NOTE — Telephone Encounter (Signed)
Called [phone] to reach patient's daughter, Lanette Hampshire, the line rang and then went to a fast busy signal.  Will try at another time.

## 2019-12-01 NOTE — ED Triage Notes (Signed)
Pt reports feeling fatigue since getting pneumonia vaccine in mid July. Pt reports having body aches in his  shoulders, neck and waist line.

## 2019-12-01 NOTE — Telephone Encounter (Signed)
I called and spoke with the pt's daughter Harrold Donath  She states pt had PNA vaccine at pharm approx 1 wk ago  Next day felt jaw stiffness and sore arm  A couple days later developed fever of 102 and fatigue- went to ED 11/30/19- waited 5 hours and left, tired of waiting  Went to UC on 12/01/19- dx with PNA and started on amoxicillin and zithromax  She states pharmacist mentioned interaction with these abx and methotrexate  I recommend that she call the UC with these concerns being that they prescribed meds and are treating pt  She verbalized understanding  I advised will let Dr Ander Slade know what is going on and see when f/u here should be to f/u on PNA  Please advise thanks!

## 2019-12-01 NOTE — Telephone Encounter (Signed)
Searched DPR-- Lanette Hampshire is listed and can have information released to her.

## 2019-12-01 NOTE — ED Provider Notes (Signed)
[facility]    CSN: [id number] Arrival date & time: 12/01/19  0954      History   Chief Complaint Chief Complaint  Patient presents with  . Fatigue    HPI [name] is a 79 y.o. male with extensive medical history including hypertension, OSA, interstitial lung disease, history of pneumothorax on right side (Feb 2021, s/p surgical repair) presenting for weeklong course of fatigue and dry cough.  Denies hemoptysis, chest pain or palpitations, lightheadedness or dizziness, nausea, vomiting, severe abdominal pain.  States this is preceded by his pneumococcal vaccine.  No rash, change in bowel habit.  States over the weekend he did urinate more than normal, though this is since normalized.  Reports compliance with all medications as outlined below: No change thereof.  Of note, patient did go to ER yesterday: Had EKG, CBC, CMP-please see those notes which were reviewed by me at time of visit.    Past Medical History:  Diagnosis Date  . Adenomatous colon polyp   . Diverticulosis   . GERD (gastroesophageal reflux disease)   . Hemorrhoids    internal and external  . Hiatal hernia   . Hypertension   . Rheumatoid arthritis ([place])   . S/P dilatation of esophageal stricture     Patient Active Problem List   Diagnosis Date Noted  . History of pneumothorax 08/04/2019  . Postop check 06/30/2019  . Recurrent pneumothorax 06/14/2019  . Pneumothorax on right 05/28/2019  . ARF (acute renal failure) ([place]) 11/08/2015  . AKI (acute kidney injury) ([place]) 11/07/2015  . Normocytic anemia 12/01/2014  . FLATULENCE-GAS-BLOATING 10/03/2009  . ARTHRITIS, RHEUMATOID 06/25/2007  . HEMORRHOIDS 12/24/2005  . ESOPHAGEAL STRICTURE 12/24/2005  . GERD 12/24/2005  . HIATAL HERNIA 12/24/2005  . DIVERTICULOSIS, COLON 12/24/2005  . ESOPHAGITIS, REFLUX 02/18/2001  . COLONIC POLYPS, HYPERPLASTIC 02/10/2001    Past Surgical History:  Procedure Laterality Date  . CIRCUMCISION    . [place]  2002  . PLEURADESIS Right 06/14/2019   Procedure: PLEURADESIS USING TALC;  Surgeon: [name], MD;  Location: [facility];  Service: Thoracic;  Laterality: Right;  . STAPLING OF BLEBS Right 06/14/2019   Procedure: STAPLING OF BLEBS;  Surgeon: [name], MD;  Location: [facility];  Service: Thoracic;  Laterality: Right;  Marland Kitchen VIDEO ASSISTED THORACOSCOPY Right 06/14/2019   Procedure: VIDEO ASSISTED THORACOSCOPY WITH RIGHT UPPER LOBE WEDGE;  Surgeon: [name], MD;  Location: [facility];  Service: Thoracic;  Laterality: Right;       Home Medications    Prior to Admission medications   Medication Sig Start Date End Date Taking? Authorizing Provider  acetaminophen (TYLENOL) 500 MG tablet Take 2 tablets (1,000 mg total) by mouth every 6 (six) hours. 06/18/19   Elgie Collard, PA-C  amLODipine (NORVASC) 10 MG tablet Take 1 tablet (10 mg total) by mouth daily. 11/08/15   Thurnell Lose, MD  amoxicillin (AMOXIL) 500 MG capsule Take 1 capsule (500 mg total) by mouth 3 (three) times daily. 12/01/19   Hall-Potvin, [place], PA-C  aspirin EC 81 MG tablet Take 81 mg by mouth daily.    [provider]  azithromycin (ZITHROMAX) 250 MG tablet Take 1 tablet (250 mg total) by mouth daily. Take first 2 tablets together, then 1 every day until finished. 12/01/19   Hall-Potvin, [place], PA-C  cetirizine (ZYRTEC) 10 MG tablet Take 1 tablet 1-2 times daily as needed. 08/10/19   Kozlow, Donnamarie Poag, MD  FARXIGA 10 MG TABS  tablet  07/01/19   [provider]  FOLIC ACID PO Take 1 tablet by mouth daily.    [provider]  Menthol-Methyl Salicylate (MUSCLE RUB) 10-15 % CREA Apply 1 application topically as needed for muscle pain (shoulder/neck).    [provider]  methotrexate (RHEUMATREX) 2.5 MG tablet Take 10 mg by mouth See admin instructions. Take 10mg  every Thursday and Friday    [provider]  montelukast (SINGULAIR) 10 MG tablet Take 1 tablet (10 mg total) by mouth  at bedtime. 08/10/19   Kozlow, Donnamarie Poag, MD  Olopatadine HCl (PATADAY) 0.2 % SOLN Use 1 drop in each eye once daily as needed. 08/10/19   Kozlow, Donnamarie Poag, MD  omeprazole (PRILOSEC) 20 MG capsule Take 1 capsule (20 mg total) by mouth daily. 11/03/19   Irene Shipper, MD  valsartan-hydrochlorothiazide (DIOVAN-HCT) 320-25 MG tablet Take 1 tablet by mouth daily. 04/19/19   [provider]    Family History Family History  Problem Relation Age of Onset  . Prostate cancer Paternal Uncle   . Colon cancer Neg Hx   . Throat cancer Neg Hx   . Diabetes Neg Hx   . Kidney disease Neg Hx   . Liver disease Neg Hx     Social History Social History   Tobacco Use  . Smoking status: Never Smoker  . Smokeless tobacco: Never Used  Vaping Use  . Vaping Use: Never used  Substance Use Topics  . Alcohol use: No  . Drug use: No     Allergies   Patient has no known allergies.   Review of Systems As per HPI   Physical Exam Triage Vital Signs ED Triage Vitals  Enc Vitals Group     BP      Pulse      Resp      Temp      Temp src      SpO2      Weight      Height      Head Circumference      Peak Flow      Pain Score      Pain Loc      Pain Edu?      Excl. in [place]?    No data found.  Updated Vital Signs BP 122/65 (BP Location: Left Arm)   Pulse 99   Temp 99.2 F (37.3 C) (Oral)   Resp 20   SpO2 92%   Visual Acuity Right Eye Distance:   Left Eye Distance:   Bilateral Distance:    Right Eye Near:   Left Eye Near:    Bilateral Near:     Physical Exam Constitutional:      General: He is not in acute distress. HENT:     Head: Normocephalic and atraumatic.  Eyes:     General: No scleral icterus.    Pupils: Pupils are equal, round, and reactive to light.  Cardiovascular:     Rate and Rhythm: Normal rate.  Pulmonary:     Effort: Pulmonary effort is normal. No respiratory distress.     Breath sounds: No stridor. Rhonchi present.     Comments: Mild, diffuse Skin:     Coloration: Skin is not jaundiced or pale.  Neurological:     Mental Status: He is alert and oriented to person, place, and time.      UC Treatments / Results  Labs (all labs ordered are listed, but only abnormal results are displayed) Labs  Reviewed - No data to display  EKG   Radiology DG Chest 2 View  Result Date: 12/01/2019 CLINICAL DATA:  Cough and fatigue EXAM: CHEST - 2 VIEW COMPARISON:  08/04/2019 FINDINGS: Cardiac shadow is stable. Aortic calcifications are again seen. Chronic interstitial changes are again identified. There is increased density in the left mid lung consistent with an acute on chronic infiltrate. This appears primarily within the left lower lobe. No sizable effusion is noted. Degenerative changes of the thoracic spine are seen. IMPRESSION: Acute on chronic infiltrate in the left mid lung. Electronically Signed   By: Inez Catalina M.D.   On: 12/01/2019 10:43    Procedures Procedures (including critical care time)  Medications Ordered in UC Medications - No data to display  Initial Impression / Assessment and Plan / UC Course  I have reviewed the triage vital signs and the nursing notes.  Pertinent labs & imaging results that were available during my care of the patient were reviewed by me and considered in my medical decision making (see chart for details).     Patient afebrile, nontoxic, and without respiratory distress.  EKG done in office, reviewed by me and compared to previous from yesterday: NSR with ventricular rate 98 bpm.  No QTC prolongation, ST elevation or depression.  Waveforms stable in all leads: Nonacute EKG.  Chest x-ray done office, reviewed by me radiology: Acute on chronic infiltrate in left mid lung.  Will treat for pneumonia as outlined below.  Reviewed signs of patient verbalized understanding.  CrCl calculated per C-G formula: 44 mL/min.  Follow up with PCP in 1 week.  Return precautions discussed, pt verbalized understanding and is  agreeable to plan. Final Clinical Impressions(s) / UC Diagnoses   Final diagnoses:  Pneumonia of left upper lobe due to infectious organism     Discharge Instructions     Take antibiotics as discussed. Very important follow-up with your primary care next week. Drink plenty fluids. Very important that you go to the emergency room if you develop worsening fatigue, change in urination, chest pain, or difficulty breathing.    ED Prescriptions    Medication Sig Dispense Auth. Provider   amoxicillin (AMOXIL) 500 MG capsule Take 1 capsule (500 mg total) by mouth 3 (three) times daily. 21 capsule Hall-Potvin, [place], PA-C   azithromycin (ZITHROMAX) 250 MG tablet Take 1 tablet (250 mg total) by mouth daily. Take first 2 tablets together, then 1 every day until finished. 6 tablet Hall-Potvin, [place], PA-C     PDMP not reviewed this encounter.   Hall-Potvin, [place], [place] 12/01/19 1054

## 2019-12-01 NOTE — Telephone Encounter (Signed)
cont'd   Pharmacist said there are potential interactions between some of the pt's existing meds and new antibiotics prescribed. Please advise pt's daughter when able. She doesn't know what to do.  She is seeking explanations about vaccines as post covid vaccine pt had collapsed lung and needed surgery. Post pneumonia vaccine pt got pneumonia.

## 2019-12-08 NOTE — Telephone Encounter (Signed)
Obtain chest x-ray about 4 weeks  Follow-up after chest x-ray or day of x-ray  Appointment in about 4 weeks

## 2019-12-08 NOTE — Telephone Encounter (Signed)
Called and spoke with pt's daughter Harrold Donath letting her know the info stated by AO and she verbalized understanding. I have scheduled pt an appt in 4 weeks and stated to her for them to arrive early to have cxr prior to seeing APP. Order for cxr has been placed. Nothing further needed.

## 2019-12-09 ENCOUNTER — Telehealth: Payer: Self-pay | Admitting: Pulmonary Disease

## 2019-12-09 DIAGNOSIS — I10 Essential (primary) hypertension: Secondary | ICD-10-CM | POA: Diagnosis not present

## 2019-12-09 DIAGNOSIS — M0559 Rheumatoid polyneuropathy with rheumatoid arthritis of multiple sites: Secondary | ICD-10-CM | POA: Diagnosis not present

## 2019-12-09 DIAGNOSIS — Z0000 Encounter for general adult medical examination without abnormal findings: Secondary | ICD-10-CM | POA: Diagnosis not present

## 2019-12-09 DIAGNOSIS — G4733 Obstructive sleep apnea (adult) (pediatric): Secondary | ICD-10-CM

## 2019-12-09 DIAGNOSIS — J189 Pneumonia, unspecified organism: Secondary | ICD-10-CM | POA: Diagnosis not present

## 2019-12-09 DIAGNOSIS — E7800 Pure hypercholesterolemia, unspecified: Secondary | ICD-10-CM | POA: Diagnosis not present

## 2019-12-09 DIAGNOSIS — E785 Hyperlipidemia, unspecified: Secondary | ICD-10-CM | POA: Diagnosis not present

## 2019-12-09 NOTE — Telephone Encounter (Signed)
Call patient  Sleep study result  Date of study: 11/29/2019  Impression: Mild obstructive sleep apnea Moderate oxygen desaturations  Recommendation: Recommend CPAP therapy for mild obstructive sleep apnea Auto titrating CPAP with pressure settings of 5-15 will be appropriate  Follow-up as previously scheduled

## 2019-12-15 NOTE — Telephone Encounter (Signed)
Patient contacted with results of home sleep study. Patient agrees with starting CPAP therapy, DME order placed, follow up recall placed for 2-3 months with Dr. Ander Slade.

## 2019-12-15 NOTE — Addendum Note (Signed)
Addended by: Tery Sanfilippo R on: 12/15/2019 03:21 PM   Modules accepted: Orders

## 2019-12-23 DIAGNOSIS — J849 Interstitial pulmonary disease, unspecified: Secondary | ICD-10-CM | POA: Diagnosis not present

## 2019-12-23 DIAGNOSIS — M0579 Rheumatoid arthritis with rheumatoid factor of multiple sites without organ or systems involvement: Secondary | ICD-10-CM | POA: Diagnosis not present

## 2019-12-23 DIAGNOSIS — Z79899 Other long term (current) drug therapy: Secondary | ICD-10-CM | POA: Diagnosis not present

## 2019-12-28 DIAGNOSIS — M069 Rheumatoid arthritis, unspecified: Secondary | ICD-10-CM | POA: Diagnosis not present

## 2019-12-28 DIAGNOSIS — J849 Interstitial pulmonary disease, unspecified: Secondary | ICD-10-CM | POA: Diagnosis not present

## 2019-12-28 DIAGNOSIS — I10 Essential (primary) hypertension: Secondary | ICD-10-CM | POA: Diagnosis not present

## 2019-12-28 DIAGNOSIS — E785 Hyperlipidemia, unspecified: Secondary | ICD-10-CM | POA: Diagnosis not present

## 2019-12-29 ENCOUNTER — Ambulatory Visit: Payer: Medicare Other | Admitting: Internal Medicine

## 2019-12-29 ENCOUNTER — Encounter: Payer: Self-pay | Admitting: Internal Medicine

## 2019-12-29 VITALS — BP 118/50 | HR 75 | Ht 68.0 in | Wt 203.0 lb

## 2019-12-29 DIAGNOSIS — K219 Gastro-esophageal reflux disease without esophagitis: Secondary | ICD-10-CM

## 2019-12-29 MED ORDER — OMEPRAZOLE 20 MG PO CPDR: 20.0000 mg | DELAYED_RELEASE_CAPSULE | Freq: Every day | ORAL | 3 refills | Status: DC

## 2019-12-29 NOTE — Progress Notes (Signed)
HISTORY OF PRESENT ILLNESS:  [name] is a 79 y.o. male with GERD complicated by peptic stricture requiring esophageal dilation as well as a history of adenomatous colon polyps who presents today for follow-up regarding ongoing management of his chronic GERD and medication refill.  [name] was last seen April 08, 2019.  See that dictation.  Since that time he reports doing well from a GI standpoint.  However, he did require VATS procedure and pleurodesis with Dr. Darcey Nora for benign pneumothorax.  He has recovered.  From a GI standpoint he is doing well.  He denies active reflux symptoms as long as he takes PPI.  No recurrent dysphagia.  No lower GI complaints.  Review of blood work from November 30, 2019 reveals hemoglobin 12.7.  His last colonoscopy was performed August 2017.  He was found to have 2 diminutive polyps.  He has aged out of surveillance.  He has completed his Covid vaccination series  REVIEW OF SYSTEMS:  All non-GI ROS negative unless otherwise stated in the HPI.  Past Medical History:  Diagnosis Date  . Adenomatous colon polyp   . Collapsed lung 04/2018  . Diverticulosis   . GERD (gastroesophageal reflux disease)   . Hemorrhoids    internal and external  . Hiatal hernia   . Hypertension   . Pneumonia 11/2019  . Rheumatoid arthritis ([place])   . S/P dilatation of esophageal stricture     Past Surgical History:  Procedure Laterality Date  . CIRCUMCISION    . [place]  2002  . PLEURADESIS Right 06/14/2019   Procedure: PLEURADESIS USING TALC;  Surgeon: [name], MD;  Location: [facility];  Service: Thoracic;  Laterality: Right;  . STAPLING OF BLEBS Right 06/14/2019   Procedure: STAPLING OF BLEBS;  Surgeon: [name], MD;  Location: [facility];  Service: Thoracic;  Laterality: Right;  Marland Kitchen VIDEO ASSISTED THORACOSCOPY Right 06/14/2019   Procedure: VIDEO ASSISTED THORACOSCOPY WITH RIGHT UPPER LOBE WEDGE;  Surgeon: [name], MD;  Location: [facility];  Service:  Thoracic;  Laterality: Right;    Social History [name]  reports that he has never smoked. He has never used smokeless tobacco. He reports that he does not drink alcohol and does not use drugs.  family history includes Prostate cancer in his paternal uncle.  No Known Allergies     PHYSICAL EXAMINATION: Vital signs: BP (!) 118/50   Pulse 75   Ht 5\' 8"  (1.727 m)   Wt 203 lb (92.1 kg)   SpO2 97%   BMI 30.87 kg/m   Constitutional: generally well-appearing, no acute distress Psychiatric: alert and oriented x3, cooperative Eyes: extraocular movements intact, anicteric, conjunctiva pink Mouth: oral pharynx moist, no lesions Neck: supple no lymphadenopathy Cardiovascular: heart regular rate and rhythm, no murmur Lungs: clear to auscultation bilaterally Abdomen: soft, nontender, nondistended, no obvious ascites, no peritoneal signs, normal bowel sounds, no organomegaly Rectal: Omitted Extremities: no, cyanosis, or lower extremity edema bilaterally Skin: no lesions on visible extremities Neuro: No focal deficits.  Cranial nerves intact  ASSESSMENT:  1.  GERD complicated by peptic stricture.  Patient remains asymptomatic post dilation on PPI 2.  History of adenomatous colon polyps.  Has successfully aged out of the surveillance program 3.  General medical problems.  Stable   PLAN:  1.  Reflux precautions 2.  Refill omeprazole 20 mg daily.  Medication risks reviewed 3.  Routine GI follow-up 1 year.  Sooner if needed

## 2019-12-29 NOTE — Patient Instructions (Signed)
We have sent the following medications to your pharmacy for you to pick up at your convenience:  Omeprazole.  Please follow up in one year  

## 2020-01-05 ENCOUNTER — Ambulatory Visit: Payer: Medicare Other | Admitting: Adult Health

## 2020-01-07 ENCOUNTER — Telehealth: Payer: Self-pay | Admitting: Adult Health

## 2020-01-07 ENCOUNTER — Ambulatory Visit (INDEPENDENT_AMBULATORY_CARE_PROVIDER_SITE_OTHER): Payer: Medicare Other

## 2020-01-07 ENCOUNTER — Ambulatory Visit: Payer: Medicare Other | Admitting: Adult Health

## 2020-01-07 ENCOUNTER — Other Ambulatory Visit: Payer: Self-pay

## 2020-01-07 ENCOUNTER — Encounter: Payer: Self-pay | Admitting: Adult Health

## 2020-01-07 DIAGNOSIS — J189 Pneumonia, unspecified organism: Secondary | ICD-10-CM | POA: Insufficient documentation

## 2020-01-07 DIAGNOSIS — G4733 Obstructive sleep apnea (adult) (pediatric): Secondary | ICD-10-CM | POA: Insufficient documentation

## 2020-01-07 NOTE — Assessment & Plan Note (Signed)
Recently diagnosed obstructive sleep apnea.  CPAP is pending.  Patient has a planned delivery on January 28, 2020.  Patient education on sleep apnea and CPAP.  Plan  Patient Instructions  Continue on current regimen .  Activity as tolerated.  Flu shot when available  Begin CPAP when available  Wear CPAP all night long  Do not drive is sleepy .  Follow up with Dr. Chase Caller in 2- 3 months with follow up chest xray  and As needed

## 2020-01-07 NOTE — Patient Instructions (Addendum)
Continue on current regimen .  Activity as tolerated.  Flu shot when available  Begin CPAP when available  Wear CPAP all night long  Do not drive is sleepy .  Follow up with Dr. Chase Caller in 2- 3 months with follow up chest xray  and As needed

## 2020-01-07 NOTE — Assessment & Plan Note (Signed)
Left sided pneumonia clinically improved after antibiotics.  Chest x-ray shows decreased opacities consistent with a resolving pneumonia.  Patient will follow-up chest x-ray on return for complete resolution.  Continue on current regimen.  Plan  Patient Instructions  Continue on current regimen .  Activity as tolerated.  Flu shot when available  Begin CPAP when available  Wear CPAP all night long  Do not drive is sleepy .  Follow up with Dr. Chase Caller in 2- 3 months with follow up chest xray  and As needed

## 2020-01-07 NOTE — Telephone Encounter (Signed)
Called and spoke with patient.  He had a chest x-ray this morning and wants the results of his x-ray sent to his pcp, he has an OV in a few weeks.  Advised pt I would make Tammy aware as it has not been read by Tammy at this time.  Let patient know we will be glad to send the results to his pcp after it has been read by Tammy. Tammy, please advise on x-ray after you have had a chance to review.  Thank you.

## 2020-01-07 NOTE — Progress Notes (Signed)
@Patient  ID: [name], male    DOB: 1940/07/03, 79 y.o.   MRN: [id number]  Chief Complaint  Patient presents with  . Follow-up    PNA    Referring provider: [name], MD  HPI: 79 year old male followed for rheumatoid arthritis associated interstitial lung disease Medical history significant for pneumothorax. Recently diagnosed obstructive sleep apnea awaiting CPAP delivery   TEST/EVENTS :  August 2021 Home sleep study showed mild obstructive sleep apnea with moderate oxygen desaturations  High-resolution CT chest September 30, 2019 showed chronic lung changes compatible with interstitial lung disease indicative of UIP.  No progression of disease compared to previous study.    01/07/2020 Follow up : ILD , PNA , OSA  patient presents for a 1 month follow-up.  Patient was recently seen in the emergency room early August for acute respiratory symptoms.  Chest x-ray showed a mid left lung density.  Patient was treated for a pneumonia.  Patient says he finished a complete course of antibiotics with amoxicillin and Zithromax and is feeling much better.  Cough and congestion have decreased.  Chest x-ray today shows decreased patchy opacity on the left.  Patient has underlying rheumatoid arthritis associated interstitial lung disease.  He is on methotrexate says overall his arthritis is doing well.  Other than his recent pneumonia breathing has been doing at baseline.  No increased shortness of breath or decreased activity tolerance.  Patient is recently been diagnosed with obstructive sleep apnea.  He is awaiting his CPAP delivery which is scheduled for October 1.  No Known Allergies  Immunization History  Administered Date(s) Administered  . Influenza, High Dose Seasonal PF 01/31/2018, 12/15/2018  . PFIZER SARS-COV-2 Vaccination 05/21/2019, 06/24/2019    Past Medical History:  Diagnosis Date  . Adenomatous colon polyp   . Collapsed lung 04/2018  . Diverticulosis   . GERD  (gastroesophageal reflux disease)   . Hemorrhoids    internal and external  . Hiatal hernia   . Hypertension   . Pneumonia 11/2019  . Rheumatoid arthritis ([place])   . S/P dilatation of esophageal stricture     Tobacco History: Social History   Tobacco Use  Smoking Status Never Smoker  Smokeless Tobacco Never Used   Counseling given: Not Answered   Outpatient Medications Prior to Visit  Medication Sig Dispense Refill  . acetaminophen (TYLENOL) 500 MG tablet Take 2 tablets (1,000 mg total) by mouth every 6 (six) hours. (Patient taking differently: Take 1,000 mg by mouth every 6 (six) hours as needed. ) 30 tablet 0  . amLODipine (NORVASC) 10 MG tablet Take 1 tablet (10 mg total) by mouth daily. 30 tablet 0  . aspirin EC 81 MG tablet Take 81 mg by mouth daily.    . cetirizine (ZYRTEC) 10 MG tablet Take 1 tablet 1-2 times daily as needed. 60 tablet 5  . FARXIGA 10 MG TABS tablet 10 mg daily.     Marland Kitchen FOLIC ACID PO Take 1 tablet by mouth daily.    . Menthol-Methyl Salicylate (MUSCLE RUB) 10-15 % CREA Apply 1 application topically as needed for muscle pain (shoulder/neck).    . methotrexate (RHEUMATREX) 2.5 MG tablet Take 10 mg by mouth See admin instructions. Take 10mg  every Thursday and Friday    . montelukast (SINGULAIR) 10 MG tablet Take 1 tablet (10 mg total) by mouth at bedtime. 30 tablet 5  . Olopatadine HCl (PATADAY) 0.2 % SOLN Use 1 drop in each eye once daily as needed. 2.5 mL 5  .  omeprazole (PRILOSEC) 20 MG capsule Take 1 capsule (20 mg total) by mouth daily. 90 capsule 3  . valsartan-hydrochlorothiazide (DIOVAN-HCT) 320-25 MG tablet Take 1 tablet by mouth daily.     No facility-administered medications prior to visit.     Review of Systems:   Constitutional:   No  weight loss, night sweats,  Fevers, chills, fatigue, or  lassitude.  HEENT:   No headaches,  Difficulty swallowing,  Tooth/dental problems, or  Sore throat,                No sneezing, itching, ear ache, nasal  congestion, post nasal drip,   CV:  No chest pain,  Orthopnea, PND, swelling in lower extremities, anasarca, dizziness, palpitations, syncope.   GI  No heartburn, indigestion, abdominal pain, nausea, vomiting, diarrhea, change in bowel habits, loss of appetite, bloody stools.   Resp: .  No chest wall deformity  Skin: no rash or lesions.  GU: no dysuria, change in color of urine, no urgency or frequency.  No flank pain, no hematuria   MS:  No joint pain or swelling.  No decreased range of motion.  No back pain.    Physical Exam  BP (!) 110/50 (BP Location: Left Arm, Cuff Size: Normal)   Pulse 73   Temp 97.9 F (36.6 C) (Temporal)   Ht 5' 7.5" (1.715 m)   Wt 204 lb (92.5 kg)   SpO2 98% Comment: RA  BMI 31.48 kg/m   GEN: A/Ox3; pleasant , NAD, well nourished    HEENT:  [place]/AT,  NOSE-clear, THROAT-clear, no lesions, no postnasal drip or exudate noted.   NECK:  Supple w/ fair ROM; no JVD; normal carotid impulses w/o bruits; no thyromegaly or nodules palpated; no lymphadenopathy.    RESP faint bibasilar crackles, no accessory muscle use, no dullness to percussion  CARD:  RRR, no m/r/g, tr  peripheral edema, pulses intact, no cyanosis or clubbing.  GI:   Soft & nt; nml bowel sounds; no organomegaly or masses detected.   Musco: Warm bil, no deformities or joint swelling noted.   Neuro: alert, no focal deficits noted.    Skin: Warm, no lesions or rashes  BMET  BNP No results found for: BNP  ProBNP No results found for: PROBNP  Imaging: DG Chest 2 View  Result Date: 01/07/2020 CLINICAL DATA:  Left upper lobe pneumonia follow-up. EXAM: CHEST - 2 VIEW COMPARISON:  Chest x-ray dated December 01, 2019. FINDINGS: The heart size and mediastinal contours are within normal limits. Normal pulmonary vascularity. Chronic interstitial thickening related to underlying fibrosis. Improving consolidation in the left upper lobe. No pleural effusion or pneumothorax. No acute osseous  abnormality. IMPRESSION: 1. Improving left upper lobe pneumonia. Electronically Signed   By: Titus Dubin M.D.   On: 01/07/2020 11:12      No flowsheet data found.  No results found for: NITRICOXIDE      Assessment & Plan:   Pneumonia Left sided pneumonia clinically improved after antibiotics.  Chest x-ray shows decreased opacities consistent with a resolving pneumonia.  Patient will follow-up chest x-ray on return for complete resolution.  Continue on current regimen.  Plan  Patient Instructions  Continue on current regimen .  Activity as tolerated.  Flu shot when available  Begin CPAP when available  Wear CPAP all night long  Do not drive is sleepy .  Follow up with Dr. Chase Caller in 2- 3 months with follow up chest xray  and As needed  OSA (obstructive sleep apnea) Recently diagnosed obstructive sleep apnea.  CPAP is pending.  Patient has a planned delivery on January 28, 2020.  Patient education on sleep apnea and CPAP.  Plan  Patient Instructions  Continue on current regimen .  Activity as tolerated.  Flu shot when available  Begin CPAP when available  Wear CPAP all night long  Do not drive is sleepy .  Follow up with Dr. Chase Caller in 2- 3 months with follow up chest xray  and As needed           Rexene Edison, NP 01/07/2020

## 2020-01-11 NOTE — Telephone Encounter (Signed)
cxr has been sent to PCP.  Nothing further needed at this time- will close encounter.

## 2020-01-11 NOTE — Telephone Encounter (Signed)
That is fine 

## 2020-01-21 DIAGNOSIS — I10 Essential (primary) hypertension: Secondary | ICD-10-CM | POA: Diagnosis not present

## 2020-01-21 DIAGNOSIS — E785 Hyperlipidemia, unspecified: Secondary | ICD-10-CM | POA: Diagnosis not present

## 2020-01-24 DIAGNOSIS — I10 Essential (primary) hypertension: Secondary | ICD-10-CM | POA: Diagnosis not present

## 2020-01-24 DIAGNOSIS — Z23 Encounter for immunization: Secondary | ICD-10-CM | POA: Diagnosis not present

## 2020-01-27 DIAGNOSIS — J849 Interstitial pulmonary disease, unspecified: Secondary | ICD-10-CM | POA: Diagnosis not present

## 2020-01-27 DIAGNOSIS — E785 Hyperlipidemia, unspecified: Secondary | ICD-10-CM | POA: Diagnosis not present

## 2020-01-27 DIAGNOSIS — M069 Rheumatoid arthritis, unspecified: Secondary | ICD-10-CM | POA: Diagnosis not present

## 2020-01-27 DIAGNOSIS — I10 Essential (primary) hypertension: Secondary | ICD-10-CM | POA: Diagnosis not present

## 2020-01-28 DIAGNOSIS — G4733 Obstructive sleep apnea (adult) (pediatric): Secondary | ICD-10-CM | POA: Diagnosis not present

## 2020-02-02 ENCOUNTER — Other Ambulatory Visit: Payer: Self-pay | Admitting: Allergy and Immunology

## 2020-02-03 DIAGNOSIS — H04123 Dry eye syndrome of bilateral lacrimal glands: Secondary | ICD-10-CM | POA: Diagnosis not present

## 2020-02-03 DIAGNOSIS — H401211 Low-tension glaucoma, right eye, mild stage: Secondary | ICD-10-CM | POA: Diagnosis not present

## 2020-02-03 DIAGNOSIS — H401222 Low-tension glaucoma, left eye, moderate stage: Secondary | ICD-10-CM | POA: Diagnosis not present

## 2020-02-26 DIAGNOSIS — M069 Rheumatoid arthritis, unspecified: Secondary | ICD-10-CM | POA: Diagnosis not present

## 2020-02-26 DIAGNOSIS — E785 Hyperlipidemia, unspecified: Secondary | ICD-10-CM | POA: Diagnosis not present

## 2020-02-26 DIAGNOSIS — J849 Interstitial pulmonary disease, unspecified: Secondary | ICD-10-CM | POA: Diagnosis not present

## 2020-02-26 DIAGNOSIS — I10 Essential (primary) hypertension: Secondary | ICD-10-CM | POA: Diagnosis not present

## 2020-02-27 DIAGNOSIS — M069 Rheumatoid arthritis, unspecified: Secondary | ICD-10-CM | POA: Diagnosis not present

## 2020-02-27 DIAGNOSIS — I10 Essential (primary) hypertension: Secondary | ICD-10-CM | POA: Diagnosis not present

## 2020-02-27 DIAGNOSIS — J849 Interstitial pulmonary disease, unspecified: Secondary | ICD-10-CM | POA: Diagnosis not present

## 2020-02-27 DIAGNOSIS — E785 Hyperlipidemia, unspecified: Secondary | ICD-10-CM | POA: Diagnosis not present

## 2020-02-28 ENCOUNTER — Other Ambulatory Visit: Payer: Self-pay | Admitting: Allergy and Immunology

## 2020-02-28 DIAGNOSIS — G4733 Obstructive sleep apnea (adult) (pediatric): Secondary | ICD-10-CM | POA: Diagnosis not present

## 2020-03-14 ENCOUNTER — Ambulatory Visit (INDEPENDENT_AMBULATORY_CARE_PROVIDER_SITE_OTHER): Payer: Medicare Other | Admitting: Allergy and Immunology

## 2020-03-14 ENCOUNTER — Encounter: Payer: Self-pay | Admitting: Allergy and Immunology

## 2020-03-14 ENCOUNTER — Other Ambulatory Visit: Payer: Self-pay

## 2020-03-14 VITALS — BP 114/50 | HR 74 | Temp 98.0°F | Resp 14 | Ht 68.5 in | Wt 211.8 lb

## 2020-03-14 DIAGNOSIS — J3089 Other allergic rhinitis: Secondary | ICD-10-CM | POA: Diagnosis not present

## 2020-03-14 DIAGNOSIS — J301 Allergic rhinitis due to pollen: Secondary | ICD-10-CM

## 2020-03-14 NOTE — Patient Instructions (Signed)
  1.  Continue to perform allergen avoidance measures - pollens, dust mite  2.  Continue to treat and prevent inflammation:   A.  Montelukast 10 mg - 1 tablet 1 time per day  3.  If needed:   A.  Zyrtec 10 - 1 tablet 1-2 times per day  B.  Pataday - 1 drop each eye 1 time per day  4.  Return to clinic in 12 months or earlier if problem

## 2020-03-14 NOTE — Progress Notes (Signed)
[facility]   Follow-up Note  Referring Provider: [name], MD Primary Provider: [name], MD Date of Office Visit: 03/14/2020  Subjective:   [name] (DOB: [birth year]) is a 79 y.o. male who returns to the [facility] on 03/14/2020 in re-evaluation of the following:  HPI: [name] returns to this clinic in evaluation of allergic rhinoconjunctivitis and a history of interstitial lung disease in the setting of rheumatoid arthritis followed by pulmonary.  His last visit to this clinic was 07 Sep 2019.  Overall he feels as though he has been doing very well especially regarding his nose and eyes on his current plan which includes montelukast on a consistent basis.  He also will wears sports glasses when he goes outdoors which helps his eye issues significantly.  He is doing his best to avoid dust mite and pollens.  We did have a discussion during his last visit about possibly considering a course of immunotherapy but he thinks he is doing well enough not to undergo that form of treatment at this point.  He continues to have evaluation with pulmonary regarding his interstitial lung disease in the context of rheumatoid arthritis treated with methotrexate.  Apparently he had a community-acquired pneumonia that developed in August that was successfully treated with an antibiotic.  As well, he is using his CPAP machine which is working quite well and he feels better in general while utilizing this device.  He has had 2 Covid vaccinations and a flu vaccine this year.  Allergies as of 03/14/2020   No Known Allergies     Medication List      acetaminophen 500 MG tablet Commonly known as: TYLENOL Take 2 tablets (1,000 mg total) by mouth every 6 (six) hours.   amLODipine 10 MG tablet Commonly known as: NORVASC Take 1 tablet (10 mg total) by mouth daily.   aspirin EC 81 MG tablet Take 81 mg by mouth daily.   cetirizine  10 MG tablet Commonly known as: ZYRTEC Take 1 tablet 1-2 times daily as needed.   Farxiga 10 MG Tabs tablet Generic drug: dapagliflozin propanediol 10 mg daily.   FOLIC ACID PO Take 1 tablet by mouth daily.   methotrexate 2.5 MG tablet Commonly known as: RHEUMATREX Take 10 mg by mouth See admin instructions. Take 10mg  every Thursday and Friday   montelukast 10 MG tablet Commonly known as: SINGULAIR TAKE 1 TABLET BY MOUTH EVERYDAY AT BEDTIME   Muscle Rub 10-15 % Crea Apply 1 application topically as needed for muscle pain (shoulder/neck).   Olopatadine HCl 0.2 % Soln Commonly known as: Pataday Use 1 drop in each eye once daily as needed.   omeprazole 20 MG capsule Commonly known as: PRILOSEC Take 1 capsule (20 mg total) by mouth daily.   valsartan-hydrochlorothiazide 320-25 MG tablet Commonly known as: DIOVAN-HCT Take 1 tablet by mouth daily.       Past Medical History:  Diagnosis Date  . Adenomatous colon polyp   . Collapsed lung 04/2018  . Diverticulosis   . GERD (gastroesophageal reflux disease)   . Hemorrhoids    internal and external  . Hiatal hernia   . Hypertension   . Pneumonia 11/2019  . Rheumatoid arthritis ([place])   . S/P dilatation of esophageal stricture     Past Surgical History:  Procedure Laterality Date  . CIRCUMCISION    . [place]  2002  . PLEURADESIS Right 06/14/2019   Procedure: PLEURADESIS  USING TALC;  Surgeon: Prescott Gum, Collier Salina, MD;  Location: [facility];  Service: Thoracic;  Laterality: Right;  . STAPLING OF BLEBS Right 06/14/2019   Procedure: STAPLING OF BLEBS;  Surgeon: [name], MD;  Location: [facility];  Service: Thoracic;  Laterality: Right;  Marland Kitchen VIDEO ASSISTED THORACOSCOPY Right 06/14/2019   Procedure: VIDEO ASSISTED THORACOSCOPY WITH RIGHT UPPER LOBE WEDGE;  Surgeon: [name], MD;  Location: [facility];  Service: Thoracic;  Laterality: Right;    Review of systems negative except as noted in HPI / PMHx or noted  below:  Review of Systems  Constitutional: Negative.   HENT: Negative.   Eyes: Negative.   Respiratory: Negative.   Cardiovascular: Negative.   Gastrointestinal: Negative.   Genitourinary: Negative.   Musculoskeletal: Negative.   Skin: Negative.   Neurological: Negative.   Endo/Heme/Allergies: Negative.   Psychiatric/Behavioral: Negative.      Objective:   Vitals:   03/14/20 0910  BP: (!) 114/50  Pulse: 74  Resp: 14  Temp: 98 F (36.7 C)  SpO2: 97%   Height: 5' 8.5" (174 cm)  Weight: 211 lb 12.8 oz (96.1 kg)   Physical Exam Constitutional:      Appearance: He is not diaphoretic.  HENT:     Head: Normocephalic.     Right Ear: Tympanic membrane, ear canal and external ear normal.     Left Ear: Tympanic membrane, ear canal and external ear normal.     Nose: Nose normal. No mucosal edema or rhinorrhea.     Mouth/Throat:     Pharynx: Uvula midline. No oropharyngeal exudate.  Eyes:     Conjunctiva/sclera: Conjunctivae normal.  Neck:     Thyroid: No thyromegaly.     Trachea: Trachea normal. No tracheal tenderness or tracheal deviation.  Cardiovascular:     Rate and Rhythm: Normal rate and regular rhythm.     Heart sounds: Normal heart sounds, S1 normal and S2 normal. No murmur heard.   Pulmonary:     Effort: No respiratory distress.     Breath sounds: Normal breath sounds. No stridor. No wheezing (Bilateral inspiratory crackles all lung fields) or rales.  Lymphadenopathy:     Head:     Right side of head: No tonsillar adenopathy.     Left side of head: No tonsillar adenopathy.     Cervical: No cervical adenopathy.  Skin:    Findings: No erythema or rash.     Nails: There is no clubbing.  Neurological:     Mental Status: He is alert.     Diagnostics: none  Assessment and Plan:   1. Perennial allergic rhinitis   2. Seasonal allergic rhinitis due to pollen     1.  Continue to perform allergen avoidance measures - pollens, dust mite  2.  Continue to  treat and prevent inflammation:   A.  Montelukast 10 mg - 1 tablet 1 time per day  3.  If needed:   A.  Zyrtec 10 - 1 tablet 1-2 times per day  B.  Pataday - 1 drop each eye 1 time per day  4.  Return to clinic in 12 months or earlier if problem  Berlie is really doing very well on his current therapy regarding his atopic upper airway and eye disease and he will continue on the plan noted above and I will see him back in his clinic in 1 year or earlier if there is a problem.  I did encourage him to obtain the  Covid booster as he is immunosuppressed and has already had 2 Covid vaccinations to date.  Allena Katz, MD Allergy / Immunology [place]

## 2020-03-15 ENCOUNTER — Encounter: Payer: Self-pay | Admitting: Allergy and Immunology

## 2020-03-20 ENCOUNTER — Ambulatory Visit: Payer: Medicare Other | Admitting: Pulmonary Disease

## 2020-03-20 ENCOUNTER — Ambulatory Visit (INDEPENDENT_AMBULATORY_CARE_PROVIDER_SITE_OTHER): Payer: Medicare Other

## 2020-03-20 ENCOUNTER — Encounter: Payer: Self-pay | Admitting: Pulmonary Disease

## 2020-03-20 ENCOUNTER — Other Ambulatory Visit: Payer: Self-pay

## 2020-03-20 ENCOUNTER — Telehealth: Payer: Self-pay | Admitting: Pulmonary Disease

## 2020-03-20 VITALS — BP 110/74 | HR 83 | Temp 98.0°F | Ht 68.0 in | Wt 213.4 lb

## 2020-03-20 DIAGNOSIS — J189 Pneumonia, unspecified organism: Secondary | ICD-10-CM

## 2020-03-20 NOTE — Progress Notes (Signed)
@Patient  ID: [name], male    DOB: October 12, 1940, 79 y.o.   MRN: [id number]  Recently treated for pneumonia  Chest x-ray during last visit did reveal some left upper lobe infiltrate Has been feeling relatively well  Referring provider: [name], MD  HPI: 79 year old male followed for rheumatoid arthritis associated interstitial lung disease Medical history significant for pneumothorax. Recently diagnosed obstructive sleep apnea awaiting CPAP delivery   TEST/EVENTS :  August 2021 Home sleep study showed mild obstructive sleep apnea with moderate oxygen desaturations  High-resolution CT chest September 30, 2019 showed chronic lung changes compatible with interstitial lung disease indicative of UIP.  No progression of disease compared to previous study.    03/20/2020 Follow up : ILD , PNA , OSA  patient presents for a 1 month follow-up.  Patient was recently seen in the emergency room early August for acute respiratory symptoms.  Chest x-ray showed a mid left lung density.  Patient was treated for a pneumonia.  Patient says he finished a complete course of antibiotics with amoxicillin and Zithromax and is feeling much better.  Cough and congestion have decreased.  Chest x-ray today shows decreased patchy opacity on the left.  Patient has underlying rheumatoid arthritis associated interstitial lung disease.  He is on methotrexate says overall his arthritis is doing well.  Other than his recent pneumonia breathing has been doing at baseline.  No increased shortness of breath or decreased activity tolerance.  Patient is recently been diagnosed with obstructive sleep apnea.  He is awaiting his CPAP delivery which is scheduled for October 1.  No Known Allergies  Immunization History  Administered Date(s) Administered  . Influenza, High Dose Seasonal PF 01/31/2018, 12/15/2018  . Influenza,inj,Quad PF,6+ Mos 01/24/2020  . PFIZER SARS-COV-2 Vaccination 05/21/2019, 06/24/2019    Past  Medical History:  Diagnosis Date  . Adenomatous colon polyp   . Collapsed lung 04/2018  . Diverticulosis   . GERD (gastroesophageal reflux disease)   . Hemorrhoids    internal and external  . Hiatal hernia   . Hypertension   . Pneumonia 11/2019  . Rheumatoid arthritis ([place])   . S/P dilatation of esophageal stricture     Tobacco History: Social History   Tobacco Use  Smoking Status Never Smoker  Smokeless Tobacco Never Used   Counseling given: Not Answered   Outpatient Medications Prior to Visit  Medication Sig Dispense Refill  . acetaminophen (TYLENOL) 500 MG tablet Take 2 tablets (1,000 mg total) by mouth every 6 (six) hours. (Patient taking differently: Take 1,000 mg by mouth every 6 (six) hours as needed. ) 30 tablet 0  . amLODipine (NORVASC) 10 MG tablet Take 1 tablet (10 mg total) by mouth daily. 30 tablet 0  . aspirin EC 81 MG tablet Take 81 mg by mouth daily.    . cetirizine (ZYRTEC) 10 MG tablet Take 1 tablet 1-2 times daily as needed. 60 tablet 5  . FARXIGA 10 MG TABS tablet 10 mg daily.     Marland Kitchen FOLIC ACID PO Take 1 tablet by mouth daily.    . Menthol-Methyl Salicylate (MUSCLE RUB) 10-15 % CREA Apply 1 application topically as needed for muscle pain (shoulder/neck).    . methotrexate (RHEUMATREX) 2.5 MG tablet Take 10 mg by mouth See admin instructions. Take 10mg  every Thursday and Friday    . montelukast (SINGULAIR) 10 MG tablet TAKE 1 TABLET BY MOUTH EVERYDAY AT BEDTIME 30 tablet 3  . Olopatadine HCl (PATADAY) 0.2 % SOLN Use 1 drop  in each eye once daily as needed. 2.5 mL 5  . omeprazole (PRILOSEC) 20 MG capsule Take 1 capsule (20 mg total) by mouth daily. 90 capsule 3  . valsartan-hydrochlorothiazide (DIOVAN-HCT) 320-25 MG tablet Take 1 tablet by mouth daily.     No facility-administered medications prior to visit.   Review of Systems:  Denies any significant ongoing symptoms Denies any shortness of breath Denies any chest pains or chest discomfort, no  cough  Sleeping well with CPAP, does not like it, does have some leaks   Physical Exam  BP 110/74 (BP Location: Left Arm, Patient Position: Sitting, Cuff Size: Large)   Pulse 83   Temp 98 F (36.7 C) (Temporal)   Ht 5\' 8"  (1.727 m)   Wt 213 lb 6.4 oz (96.8 kg)   SpO2 96%   BMI 32.45 kg/m   GEN: Alert and oriented x3  HEENT: Moist oral mucosa NECK: Supple, no JVD no thyromegaly RESP bibasal rales CARD: S1-S2 appreciated GI: Bowel sounds appreciated Musco: Warm bil, no deformities or joint swelling noted.   Compliance data from CPAP reveals 100% compliance machine set between 5 and 15 Residual AHI of 0.3  Assessment & Plan:   Obstructive sleep apnea -Encouraged to continue using CPAP on a regular basis  History of pneumothorax -Stable  Recent pneumonia  Interstitial lung disease  DME referral for CPAP supplies  We will repeat his chest x-ray today  Encouraged to call with any significant concerns  Follow-up in 3 months

## 2020-03-20 NOTE — Telephone Encounter (Signed)
Called and spoke with patient, he verified that he has received his CPAP machine.  Nothing further needed.

## 2020-03-20 NOTE — Patient Instructions (Signed)
Chest x-ray to follow-up on recent pneumonia -The pneumonia was clearing up on previous x-ray  DME referral -CPAP supplies -Trial with a new mask, current one leaks  Follow-up in 3 months

## 2020-03-22 DIAGNOSIS — M0579 Rheumatoid arthritis with rheumatoid factor of multiple sites without organ or systems involvement: Secondary | ICD-10-CM | POA: Diagnosis not present

## 2020-03-28 DIAGNOSIS — E785 Hyperlipidemia, unspecified: Secondary | ICD-10-CM | POA: Diagnosis not present

## 2020-03-28 DIAGNOSIS — I10 Essential (primary) hypertension: Secondary | ICD-10-CM | POA: Diagnosis not present

## 2020-03-28 DIAGNOSIS — J849 Interstitial pulmonary disease, unspecified: Secondary | ICD-10-CM | POA: Diagnosis not present

## 2020-03-28 DIAGNOSIS — M069 Rheumatoid arthritis, unspecified: Secondary | ICD-10-CM | POA: Diagnosis not present

## 2020-03-29 DIAGNOSIS — G4733 Obstructive sleep apnea (adult) (pediatric): Secondary | ICD-10-CM | POA: Diagnosis not present

## 2020-04-28 DIAGNOSIS — I10 Essential (primary) hypertension: Secondary | ICD-10-CM | POA: Diagnosis not present

## 2020-04-28 DIAGNOSIS — M069 Rheumatoid arthritis, unspecified: Secondary | ICD-10-CM | POA: Diagnosis not present

## 2020-04-28 DIAGNOSIS — J849 Interstitial pulmonary disease, unspecified: Secondary | ICD-10-CM | POA: Diagnosis not present

## 2020-04-28 DIAGNOSIS — E785 Hyperlipidemia, unspecified: Secondary | ICD-10-CM | POA: Diagnosis not present

## 2020-04-29 DIAGNOSIS — G4733 Obstructive sleep apnea (adult) (pediatric): Secondary | ICD-10-CM | POA: Diagnosis not present

## 2020-05-02 DIAGNOSIS — G4733 Obstructive sleep apnea (adult) (pediatric): Secondary | ICD-10-CM | POA: Diagnosis not present

## 2020-05-15 ENCOUNTER — Other Ambulatory Visit: Payer: Self-pay | Admitting: *Deleted

## 2020-05-15 NOTE — Progress Notes (Signed)
error 

## 2020-05-16 ENCOUNTER — Ambulatory Visit: Payer: Medicare Other | Admitting: Internal Medicine

## 2020-05-18 ENCOUNTER — Other Ambulatory Visit: Payer: Self-pay

## 2020-05-18 ENCOUNTER — Ambulatory Visit (INDEPENDENT_AMBULATORY_CARE_PROVIDER_SITE_OTHER): Payer: Medicare Other | Admitting: Internal Medicine

## 2020-05-18 ENCOUNTER — Encounter: Payer: Self-pay | Admitting: Internal Medicine

## 2020-05-18 VITALS — BP 122/64 | HR 82 | Temp 97.3°F | Ht 67.5 in | Wt 216.0 lb

## 2020-05-18 DIAGNOSIS — J84112 Idiopathic pulmonary fibrosis: Secondary | ICD-10-CM

## 2020-05-18 DIAGNOSIS — J849 Interstitial pulmonary disease, unspecified: Secondary | ICD-10-CM

## 2020-05-18 DIAGNOSIS — M0510 Rheumatoid lung disease with rheumatoid arthritis of unspecified site: Secondary | ICD-10-CM

## 2020-05-18 DIAGNOSIS — Z79899 Other long term (current) drug therapy: Secondary | ICD-10-CM

## 2020-05-18 DIAGNOSIS — M359 Systemic involvement of connective tissue, unspecified: Secondary | ICD-10-CM | POA: Diagnosis not present

## 2020-05-18 DIAGNOSIS — J8489 Other specified interstitial pulmonary diseases: Secondary | ICD-10-CM

## 2020-05-18 DIAGNOSIS — Z79631 Long term (current) use of antimetabolite agent: Secondary | ICD-10-CM

## 2020-05-18 NOTE — Progress Notes (Signed)
OV 07/15/2019  Subjective:  Patient ID: [name], male , DOB: January 17, 1941 , age 80 y.o. , MRN: [id number] , ADDRESS: [place] [address]   07/15/2019 -   Chief Complaint  Patient presents with  . Consult    Pt referred by Dr. Ander Slade to see MR for ILD consult. Pt denies any complaints or concerns and states he has been doing fine since last visit.     HPI [name] 80 y.o.  -patient of Dr. Jenetta Downer.  Presented in January 2021 to the hospital with right-sided pneumothorax that was spontaneous.  He was treated with pleurodesis.  I remember meeting him on 1 day during cross cover at that time.  He tells me that throughout the course of 2020 during the time of the pandemic he was having insidious onset of shortness of breath that was progressive.  Then diagnosed with right-sided pneumothorax.  None after the pleurodesis all the symptoms have completely resolved and is at baseline.  Currently is not having any dyspnea.  However high-resolution CT scan of the chest at that time showed evidence of UIP [I personally visualized and interpreted the CT].  He tells me that he has a diagnosis of rheumatoid arthritis for over 20 years.  And for all this 20 years he has been on methotrexate no other agent.  Previously many years ago was on prednisone but currently not on prednisone.  He feels stable.  No cough  He is accompanied by his daughter [name] who is also giving the history.  Referred now to ILD center  [facility] Comprehensive ILD Questionnaire  Symptoms:  -Shortness of breath is actually resolved after treatment of the pneumothorax.   SYMPTOM SCALE - ILD 07/15/2019   O2 use ra  Shortness of Breath 0 -> 5 scale with 5 being worst (score 6 If unable to do)  At rest 0  Simple tasks - showers, clothes change, eating, shaving 0  Household (dishes, doing bed, laundry) 0  Shopping 0  Walking level at own pace 0  Walking up Stairs 0  Total (30-36) Dyspnea Score 0   How bad is your cough? 0  How bad is your fatigue 0  How bad is nausea 0  How bad is vomiting?  0  How bad is diarrhea? 0  How bad is anxiety? 0  How bad is depression 0       Past Medical History : Positive for rheumatoid arthritis for the last several years.  In fact 20 years.  Treated with methotrexate along.  In the remote past was on prednisone.  Also has symptoms of acid reflux here and there.  Otherwise negative.   ROS: Negative for dyspnea fatigue.  Any collagen vascular.  Positive for acid reflux disease for the last few days.  Also has snoring and early morning headaches excessive daytime somnolence for the last few years.   FAMILY HISTORY of LUNG DISEASE: No one in the family other than him has pulmonary fibrosis or any other lung disease.   EXPOSURE HISTORY: Denies any smoking of cigarettes or marijuana or vaping or electronic cigarettes or electronic vaping.  Denies any cocaine or marijuana use.   HOME and HOBBY DETAILS : [place] last 40 years.  No dampness in the house.  No mold or mildew.  No humidifier no CPAP use.  No steam iron use.  No Jacuzzi use.  No misting Fountain use.  No pet  birds or parakeets.  No pet gerbils.  No feather pillows.  No mold in the [facility] duct.  No music habits.  No guarding habits.  No exposure to birds.   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  With inorganic antigens no exposures.   PULMONARY TOXICITY HISTORY (27 items): Positive for methotrexate for many years.  Possibly 2 decades.    Simple office walk 185 feet x  3 laps goal with forehead probe 07/15/2019   O2 used ra  Number laps completed 3  Comments about pace avg  Resting Pulse Ox/HR 100% and 74/min  Final Pulse Ox/HR 95% and 96/min  Desaturated </= 88% no  Desaturated <= 3% points Yes, 5  Got Tachycardic >/= 90/min yes  Symptoms at end of test  none  Miscellaneous comments x     OV 10/05/2019  Subjective:  Patient ID: [name], male , DOB: 01-Aug-1940 , age 79 y.o. , MRN: [id number] , ADDRESS: [place] [address]   10/05/2019 -   Chief Complaint  Patient presents with  . Follow-up    Pt states he has been doing good since last visit and denies any complaints.     HPI [name] 80 y.o. -follows up for his UIP with rheumatoid arthritis interstitial lung disease.  Since January 2021 pneumothorax has not had any further issues.  His respiratory symptoms are stable.  He feels his overall dyspnea is stable.  He only has a mild cough.  There seems to be a slight symptom change compared to March 2021 but he states he is stable.  His wife is here with him and I am meeting her for the first time.  We went over the goals of care.  He is very active and likes to be active.  They are on mind taking medications as long as the side effects can be monitored and it is a medical indication/recommendation to do so.  Today the wife has a new complaint and the concern:Wife is concerned about him snoring.  She has seen him intermittently very occasionally stop breathing in the middle of the night.  In the daytime he has excessive fatigue and sleepiness.  His BMI is less than 35.  His neck circumference was not measured but probably less than 40.  He has hypertension.  His stop bang score is 6 high risk for OSA.    ROS - per HPI  IMPRESSION: CT chest high resoltuon 1. Chronic lung changes compatible with interstitial lung disease, with a spectrum of findings indicative of usual interstitial pneumonia (UIP) per current ATS guidelines. No progression of disease compared to the prior study. 2. Aortic atherosclerosis, in addition to left main and 3 vessel coronary artery disease.  Aortic Atherosclerosis (ICD10-I70.0).   Electronically Signed   By: Vinnie Langton M.D.   On: 09/30/2019 15:50  OV  05/18/2020  Subjective:  Patient ID: [name], male , DOB: [birth year] , age 43 y.o. , MRN: [id number] , ADDRESS: [place] [address] PCP [name], MD Patient Care Team: [name], MD as PCP - General (Family Medicine)  This Provider for this visit: Treatment Team:  Attending Provider: Brand Males, MD    05/18/2020 -   Chief Complaint  Patient presents with  . Follow-up    Pt states he has been doing good since last visit and denies any complaints.   Follow-up rheumatoid arthritis related  ILD UIP pattern. Follows with Dr. Amil Amen for rheumatoid arthritis On expectant approach with the ILD  HPI [name] 79 y.o. -presents for follow-up.  Last seen several months ago.  Since then he continues to be stable.  He has mild shortness of breath when laying yard work.  Otherwise he is good.  He sees Dr. Jenetta Downer for sleep apnea.  There are no new issues.  He is on methotrexate which controls his joint pain.  He is up-to-date with his vaccines.  He has not lost any weight.   SYMPTOM SCALE - ILD 07/15/2019  10/05/2019   O2 use ra ra  Shortness of Breath 0 -> 5 scale with 5 being worst (score 6 If unable to do)   At rest 0 0  Simple tasks - showers, clothes change, eating, shaving 0 0  Household (dishes, doing bed, laundry) 0 0  Shopping 0 0  Walking level at own pace 0 2  Walking up Stairs 0 0  Total (30-36) Dyspnea Score 0 2  How bad is your cough? 0 1  How bad is your fatigue 0 1  How bad is nausea 0 0  How bad is vomiting?  0 0  How bad is diarrhea? 0 0  How bad is anxiety? 0 0  How bad is depression 0 0     Simple office walk 185 feet x  3 laps goal with forehead probe 07/15/2019  10/05/2019  05/18/2020   O2 used ra ra ra  Number laps completed 3 3   Comments about pace avg 100% and 68 100% and 75/min  Resting Pulse Ox/HR 100% and 74/min 97% and 93 96% and 100/min  Final Pulse Ox/HR 95% and 96/min no   Desaturated </= 88% no no   Desaturated <=  3% points Yes, 5 Uyes, 3   Got Tachycardic >/= 90/min yes none   Symptoms at end of test none none No complaints  Miscellaneous comments x avg pace abg    PFT  No flowsheet data found.     has a past medical history of Adenomatous colon polyp, Collapsed lung (04/2018), Diverticulosis, GERD (gastroesophageal reflux disease), Hemorrhoids, Hiatal hernia, Hypertension, Pneumonia (11/2019), Rheumatoid arthritis ([place]), and S/P dilatation of esophageal stricture.   reports that he has never smoked. He has never used smokeless tobacco.  Past Surgical History:  Procedure Laterality Date  . CIRCUMCISION    . [place]  2002  . PLEURADESIS Right 06/14/2019   Procedure: PLEURADESIS USING TALC;  Surgeon: [name], MD;  Location: [facility];  Service: Thoracic;  Laterality: Right;  . STAPLING OF BLEBS Right 06/14/2019   Procedure: STAPLING OF BLEBS;  Surgeon: [name], MD;  Location: [facility];  Service: Thoracic;  Laterality: Right;  [facility] Kitchen VIDEO ASSISTED THORACOSCOPY Right 06/14/2019   Procedure: VIDEO ASSISTED THORACOSCOPY WITH RIGHT UPPER LOBE WEDGE;  Surgeon: [name], MD;  Location: [facility];  Service: Thoracic;  Laterality: Right;    No Known Allergies  Immunization History  Administered Date(s) Administered  . Influenza, High Dose Seasonal PF 01/31/2018, 12/15/2018  . Influenza,inj,Quad PF,6+ Mos 01/24/2020  . PFIZER(Purple Top)SARS-COV-2 Vaccination 05/21/2019, 06/24/2019, 04/10/2020    Family History  Problem Relation Age of Onset  . Prostate cancer Paternal Uncle   . Colon cancer Neg Hx   . Throat cancer Neg Hx   . Diabetes Neg Hx   . Kidney disease Neg Hx   . Liver disease Neg Hx  Current Outpatient Medications:  .  acetaminophen (TYLENOL) 500 MG tablet, Take 2 tablets (1,000 mg total) by mouth every 6 (six) hours. (Patient taking differently: Take 1,000 mg by mouth every 6 (six) hours as needed.), Disp: 30 tablet, Rfl: 0 .  amLODipine (NORVASC) 10 MG  tablet, Take 1 tablet (10 mg total) by mouth daily., Disp: 30 tablet, Rfl: 0 .  aspirin EC 81 MG tablet, Take 81 mg by mouth daily., Disp: , Rfl:  .  cetirizine (ZYRTEC) 10 MG tablet, Take 1 tablet 1-2 times daily as needed., Disp: 60 tablet, Rfl: 5 .  FARXIGA 10 MG TABS tablet, 10 mg daily. , Disp: , Rfl:  .  FOLIC ACID PO, Take 1 tablet by mouth daily., Disp: , Rfl:  .  Menthol-Methyl Salicylate (MUSCLE RUB) 10-15 % CREA, Apply 1 application topically as needed for muscle pain (shoulder/neck)., Disp: , Rfl:  .  methotrexate (RHEUMATREX) 2.5 MG tablet, Take 10 mg by mouth See admin instructions. Take 10mg  every Thursday and Friday, Disp: , Rfl:  .  montelukast (SINGULAIR) 10 MG tablet, TAKE 1 TABLET BY MOUTH EVERYDAY AT BEDTIME, Disp: 30 tablet, Rfl: 3 .  Olopatadine HCl (PATADAY) 0.2 % SOLN, Use 1 drop in each eye once daily as needed., Disp: 2.5 mL, Rfl: 5 .  omeprazole (PRILOSEC) 20 MG capsule, Take 1 capsule (20 mg total) by mouth daily., Disp: 90 capsule, Rfl: 3 .  valsartan-hydrochlorothiazide (DIOVAN-HCT) 320-25 MG tablet, Take 1 tablet by mouth daily., Disp: , Rfl:       Objective:   Vitals:   05/18/20 1108  BP: 122/64  Pulse: 82  Temp: (!) 97.3 F (36.3 C)  TempSrc: Other (Comment)  SpO2: 95%  Weight: 216 lb (98 kg)  Height: 5' 7.5" (1.715 m)    Estimated body mass index is 33.33 kg/m as calculated from the following:   Height as of this encounter: 5' 7.5" (1.715 m).   Weight as of this encounter: 216 lb (98 kg).  @WEIGHTCHANGE @  [employer]   05/18/20 1108  Weight: 216 lb (98 kg)     Physical Exam   General: No distress. Looks well Neuro: Alert and Oriented x 3. GCS 15. Speech normal Psych: Pleasant Resp:  Barrel Chest - no.  Wheeze - no, Crackles - yes, No overt respiratory distress CVS: Normal heart sounds. Murmurs - no Ext: Stigmata of Connective Tissue Disease - no thought has RA HEENT: Normal upper airway. PEERL +. No post nasal drip         Assessment:       ICD-10-CM   1. Interstitial lung disease due to connective tissue disease ([place])  J84.89    M35.9   2. Methotrexate, long term, current use  Z79.899   3. Rheumatoid lung disease ([place])  M05.10   4. UIP (usual interstitial pneumonitis) ([place])  O13.086        Plan:     Patient Instructions     ICD-10-CM   1. Interstitial lung disease due to connective tissue disease ([place])  J84.89    M35.9   2. Methotrexate, long term, current use  Z79.899   3. Rheumatoid lung disease ([place])  M05.10   4. UIP (usual interstitial pneumonitis) ([facility])  J84.112     Clinically stable  Plan - Do spirometry and DLCO in 6 months -Do high-resolution CT chest supine and prone in 6 months -If there is any evidence of progression then we can consider you for antifibrotic's -Meanwhile avoid respiratory viruses  because they are common reason for flareup which can be detrimental to pulmonary fibrosis  Follow-up -6 months or sooner if needed      SIGNATURE    Dr. Brand Males, M.D., F.C.C.P,  Pulmonary and Critical Care Medicine Staff Physician, [facility] Director - Interstitial Lung Disease  Program  P[facility] [place], [place], [address]  Pager: [phone], If no answer or between  15:00h - 7:00h: call [phone] Telephone: [phone]  11:50 AM 05/18/2020

## 2020-05-18 NOTE — Patient Instructions (Signed)
ICD-10-CM   1. Interstitial lung disease due to connective tissue disease ([place])  J84.89    M35.9   2. Methotrexate, long term, current use  Z79.899   3. Rheumatoid lung disease ([place])  M05.10   4. UIP (usual interstitial pneumonitis) ([facility])  J84.112     Clinically stable  Plan - Do spirometry and DLCO in 6 months -Do high-resolution CT chest supine and prone in 6 months -If there is any evidence of progression then we can consider you for antifibrotic's -Meanwhile avoid respiratory viruses because they are common reason for flareup which can be detrimental to pulmonary fibrosis  Follow-up -6 months or sooner if needed

## 2020-05-24 DIAGNOSIS — M069 Rheumatoid arthritis, unspecified: Secondary | ICD-10-CM | POA: Diagnosis not present

## 2020-05-24 DIAGNOSIS — I10 Essential (primary) hypertension: Secondary | ICD-10-CM | POA: Diagnosis not present

## 2020-05-24 DIAGNOSIS — E785 Hyperlipidemia, unspecified: Secondary | ICD-10-CM | POA: Diagnosis not present

## 2020-05-25 ENCOUNTER — Other Ambulatory Visit: Payer: Self-pay | Admitting: Allergy and Immunology

## 2020-05-27 DIAGNOSIS — M069 Rheumatoid arthritis, unspecified: Secondary | ICD-10-CM | POA: Diagnosis not present

## 2020-05-27 DIAGNOSIS — J849 Interstitial pulmonary disease, unspecified: Secondary | ICD-10-CM | POA: Diagnosis not present

## 2020-05-27 DIAGNOSIS — I10 Essential (primary) hypertension: Secondary | ICD-10-CM | POA: Diagnosis not present

## 2020-05-27 DIAGNOSIS — E785 Hyperlipidemia, unspecified: Secondary | ICD-10-CM | POA: Diagnosis not present

## 2020-05-30 DIAGNOSIS — G4733 Obstructive sleep apnea (adult) (pediatric): Secondary | ICD-10-CM | POA: Diagnosis not present

## 2020-05-30 DIAGNOSIS — R1032 Left lower quadrant pain: Secondary | ICD-10-CM | POA: Diagnosis not present

## 2020-05-30 DIAGNOSIS — I10 Essential (primary) hypertension: Secondary | ICD-10-CM | POA: Diagnosis not present

## 2020-05-30 DIAGNOSIS — M7622 Iliac crest spur, left hip: Secondary | ICD-10-CM | POA: Diagnosis not present

## 2020-06-01 ENCOUNTER — Other Ambulatory Visit: Payer: Self-pay | Admitting: Family Medicine

## 2020-06-01 ENCOUNTER — Ambulatory Visit
Admission: RE | Admit: 2020-06-01 | Discharge: 2020-06-01 | Disposition: A | Discharge status: Home / Self Care | Payer: Medicare Other | Source: Ambulatory Visit | Attending: Family Medicine | Admitting: Family Medicine

## 2020-06-01 DIAGNOSIS — R1032 Left lower quadrant pain: Secondary | ICD-10-CM

## 2020-06-01 DIAGNOSIS — R109 Unspecified abdominal pain: Secondary | ICD-10-CM | POA: Diagnosis not present

## 2020-06-01 DIAGNOSIS — M47816 Spondylosis without myelopathy or radiculopathy, lumbar region: Secondary | ICD-10-CM | POA: Diagnosis not present

## 2020-06-19 ENCOUNTER — Encounter: Payer: Self-pay | Admitting: Pulmonary Disease

## 2020-06-19 ENCOUNTER — Ambulatory Visit: Payer: Medicare Other | Admitting: Pulmonary Disease

## 2020-06-19 ENCOUNTER — Other Ambulatory Visit: Payer: Self-pay

## 2020-06-19 VITALS — BP 122/70 | HR 71 | Temp 97.7°F | Ht 68.5 in | Wt 217.2 lb

## 2020-06-19 DIAGNOSIS — M359 Systemic involvement of connective tissue, unspecified: Secondary | ICD-10-CM | POA: Diagnosis not present

## 2020-06-19 DIAGNOSIS — Z9989 Dependence on other enabling machines and devices: Secondary | ICD-10-CM | POA: Diagnosis not present

## 2020-06-19 DIAGNOSIS — G4733 Obstructive sleep apnea (adult) (pediatric): Secondary | ICD-10-CM

## 2020-06-19 DIAGNOSIS — J8489 Other specified interstitial pulmonary diseases: Secondary | ICD-10-CM

## 2020-06-19 DIAGNOSIS — R9389 Abnormal findings on diagnostic imaging of other specified body structures: Secondary | ICD-10-CM

## 2020-06-19 NOTE — Patient Instructions (Signed)
Shortness of breath only with significant exertion -No changes need to be made -Continue to stay very active  Obstructive sleep apnea -Machine download shows that it is working very well -Continue using a CPAP nightly  Pulmonary fibrosis -Stable -Follow-up with Dr. Chase Caller as discussed earlier  I will follow-up with you in 6 months Call with any significant concerns

## 2020-06-19 NOTE — Progress Notes (Signed)
@Patient  ID: [name], male    DOB: 1941/02/29, 80 y.o.   MRN: [id number]   Referring provider: [name], MD  HPI: Patient with interstitial lung disease related to rheumatoid arthritis Past history of pneumothorax requiring surgical intervention Obstructive sleep apnea on CPAP therapy  No significant complaints today Continues to derive benefit from CPAP use with excellent compliance, waking up feeling inside a good nights rest on most nights Occasional shortness of breath only with significant exertion  TEST/EVENTS :  August 2021 Home sleep study showed mild obstructive sleep apnea with moderate oxygen desaturations -Has been using CPAP on a regular basis  High-resolution CT chest September 30, 2019 showed chronic lung changes compatible with interstitial lung disease indicative of UIP.  No progression of disease compared to previous study.  Concern about cost of 2021 with respiratory complaints-treated for pneumonia Symptoms are completely back to baseline  Patient has underlying rheumatoid arthritis associated interstitial lung disease.  He is on methotrexate says overall his arthritis is doing well.  Other than his recent pneumonia breathing has been doing at baseline.  No increased shortness of breath or decreased activity tolerance.   No Known Allergies  Immunization History  Administered Date(s) Administered  . Influenza, High Dose Seasonal PF 01/31/2018, 12/15/2018  . Influenza,inj,Quad PF,6+ Mos 01/24/2020  . PFIZER(Purple Top)SARS-COV-2 Vaccination 05/21/2019, 06/24/2019, 04/10/2020    Past Medical History:  Diagnosis Date  . Adenomatous colon polyp   . Collapsed lung 04/2018  . Diverticulosis   . GERD (gastroesophageal reflux disease)   . Hemorrhoids    internal and external  . Hiatal hernia   . Hypertension   . Pneumonia 11/2019  . Rheumatoid arthritis ([place])   . S/P dilatation of esophageal stricture     Tobacco History: Social History   Tobacco  Use  Smoking Status Never Smoker  Smokeless Tobacco Never Used   Counseling given: Not Answered   Outpatient Medications Prior to Visit  Medication Sig Dispense Refill  . acetaminophen (TYLENOL) 500 MG tablet Take 2 tablets (1,000 mg total) by mouth every 6 (six) hours. (Patient taking differently: Take 1,000 mg by mouth every 6 (six) hours as needed.) 30 tablet 0  . amLODipine (NORVASC) 10 MG tablet Take 1 tablet (10 mg total) by mouth daily. 30 tablet 0  . aspirin EC 81 MG tablet Take 81 mg by mouth daily.    . cetirizine (ZYRTEC) 10 MG tablet Take 1 tablet 1-2 times daily as needed. 60 tablet 5  . FARXIGA 10 MG TABS tablet 10 mg daily.     Marland Kitchen FOLIC ACID PO Take 1 tablet by mouth daily.    . Menthol-Methyl Salicylate (MUSCLE RUB) 10-15 % CREA Apply 1 application topically as needed for muscle pain (shoulder/neck).    . methotrexate (RHEUMATREX) 2.5 MG tablet Take 10 mg by mouth See admin instructions. Take 10mg  every Thursday and Friday    . montelukast (SINGULAIR) 10 MG tablet TAKE 1 TABLET BY MOUTH EVERYDAY AT BEDTIME 90 tablet 2  . Olopatadine HCl (PATADAY) 0.2 % SOLN Use 1 drop in each eye once daily as needed. 2.5 mL 5  . omeprazole (PRILOSEC) 20 MG capsule Take 1 capsule (20 mg total) by mouth daily. 90 capsule 3  . valsartan-hydrochlorothiazide (DIOVAN-HCT) 320-25 MG tablet Take 1 tablet by mouth daily.     No facility-administered medications prior to visit.   Review of Systems:  No significant ongoing symptoms No significant shortness of breath with moderate exertion Tolerating CPAP with restorative  sleep -Still has mask leaks, appears tolerable  Physical Exam  There were no vitals taken for this visit.  GEN: He is alert and oriented x3  HEENT: Moist oral mucosa NECK: Supple with no JVD, no thyromegaly RESP: Rales bibasilarly CARD: S1-S2 appreciated  CPAP compliance shows 100% compliance residual AHI of 0.2, machine set at 5-15, 95 percentile pressure of 11.6,  occasional leaks  Assessment & Plan:   Obstructive sleep apnea -Encouraged to continue using CPAP on a regular basis  History of pneumothorax Post surgical intervention -Has remained stable -Continue to monitor  Interstitial lung disease related to connective tissue disease -Rheumatoid arthritis -He continues on methotrexate -Was recently seen by Dr. Chase Caller with follow-up plan for about 6 months  Encouraged to call with any significant concerns  I will see him back in the office in 6 months Encouraged to stay active Encouraged to continue CPAP use

## 2020-06-22 DIAGNOSIS — M0579 Rheumatoid arthritis with rheumatoid factor of multiple sites without organ or systems involvement: Secondary | ICD-10-CM | POA: Diagnosis not present

## 2020-06-22 DIAGNOSIS — Z79899 Other long term (current) drug therapy: Secondary | ICD-10-CM | POA: Diagnosis not present

## 2020-06-22 DIAGNOSIS — J849 Interstitial pulmonary disease, unspecified: Secondary | ICD-10-CM | POA: Diagnosis not present

## 2020-06-26 DIAGNOSIS — E785 Hyperlipidemia, unspecified: Secondary | ICD-10-CM | POA: Diagnosis not present

## 2020-06-26 DIAGNOSIS — M069 Rheumatoid arthritis, unspecified: Secondary | ICD-10-CM | POA: Diagnosis not present

## 2020-06-26 DIAGNOSIS — M064 Inflammatory polyarthropathy: Secondary | ICD-10-CM | POA: Diagnosis not present

## 2020-06-26 DIAGNOSIS — J849 Interstitial pulmonary disease, unspecified: Secondary | ICD-10-CM | POA: Diagnosis not present

## 2020-06-26 DIAGNOSIS — I10 Essential (primary) hypertension: Secondary | ICD-10-CM | POA: Diagnosis not present

## 2020-06-27 DIAGNOSIS — G4733 Obstructive sleep apnea (adult) (pediatric): Secondary | ICD-10-CM | POA: Diagnosis not present

## 2020-07-27 DIAGNOSIS — E785 Hyperlipidemia, unspecified: Secondary | ICD-10-CM | POA: Diagnosis not present

## 2020-07-27 DIAGNOSIS — M069 Rheumatoid arthritis, unspecified: Secondary | ICD-10-CM | POA: Diagnosis not present

## 2020-07-27 DIAGNOSIS — I10 Essential (primary) hypertension: Secondary | ICD-10-CM | POA: Diagnosis not present

## 2020-07-28 DIAGNOSIS — G4733 Obstructive sleep apnea (adult) (pediatric): Secondary | ICD-10-CM | POA: Diagnosis not present

## 2020-07-31 DIAGNOSIS — G4733 Obstructive sleep apnea (adult) (pediatric): Secondary | ICD-10-CM | POA: Diagnosis not present

## 2020-08-03 DIAGNOSIS — H524 Presbyopia: Secondary | ICD-10-CM | POA: Diagnosis not present

## 2020-08-03 DIAGNOSIS — Z961 Presence of intraocular lens: Secondary | ICD-10-CM | POA: Diagnosis not present

## 2020-08-03 DIAGNOSIS — H401222 Low-tension glaucoma, left eye, moderate stage: Secondary | ICD-10-CM | POA: Diagnosis not present

## 2020-08-03 DIAGNOSIS — H401211 Low-tension glaucoma, right eye, mild stage: Secondary | ICD-10-CM | POA: Diagnosis not present

## 2020-08-03 DIAGNOSIS — H04123 Dry eye syndrome of bilateral lacrimal glands: Secondary | ICD-10-CM | POA: Diagnosis not present

## 2020-08-26 DIAGNOSIS — E785 Hyperlipidemia, unspecified: Secondary | ICD-10-CM | POA: Diagnosis not present

## 2020-08-26 DIAGNOSIS — M069 Rheumatoid arthritis, unspecified: Secondary | ICD-10-CM | POA: Diagnosis not present

## 2020-08-26 DIAGNOSIS — I10 Essential (primary) hypertension: Secondary | ICD-10-CM | POA: Diagnosis not present

## 2020-08-27 DIAGNOSIS — G4733 Obstructive sleep apnea (adult) (pediatric): Secondary | ICD-10-CM | POA: Diagnosis not present

## 2020-08-30 ENCOUNTER — Ambulatory Visit: Payer: Medicare Other | Admitting: Family Medicine

## 2020-08-30 ENCOUNTER — Other Ambulatory Visit: Payer: Self-pay

## 2020-08-30 ENCOUNTER — Encounter: Payer: Self-pay | Admitting: Family Medicine

## 2020-08-30 VITALS — BP 110/56 | HR 74 | Temp 97.6°F | Resp 18

## 2020-08-30 DIAGNOSIS — H1010 Acute atopic conjunctivitis, unspecified eye: Secondary | ICD-10-CM | POA: Insufficient documentation

## 2020-08-30 DIAGNOSIS — J3089 Other allergic rhinitis: Secondary | ICD-10-CM | POA: Diagnosis not present

## 2020-08-30 DIAGNOSIS — H1013 Acute atopic conjunctivitis, bilateral: Secondary | ICD-10-CM | POA: Diagnosis not present

## 2020-08-30 DIAGNOSIS — J849 Interstitial pulmonary disease, unspecified: Secondary | ICD-10-CM | POA: Insufficient documentation

## 2020-08-30 DIAGNOSIS — J302 Other seasonal allergic rhinitis: Secondary | ICD-10-CM | POA: Diagnosis not present

## 2020-08-30 MED ORDER — FLUTICASONE PROPIONATE 50 MCG/ACT NA SUSP
2.0000 | Freq: Every day | NASAL | 5 refills | Status: DC
Start: 2020-08-30 — End: 2021-03-20

## 2020-08-30 NOTE — Progress Notes (Signed)
[place] [address] Dept: [phone]  FOLLOW UP NOTE  Patient ID: [name], male    DOB: October 02, 1940  Age: 80 y.o. MRN: [id number] Date of Office Visit: 08/30/2020  Assessment  Chief Complaint: Follow-up and Burning Eyes  HPI [name] is an 80 year old male who presents to the clinic for a follow up visit. He was last seen in this clinic on 03/14/2020 by Dr. Neldon Mc for evaluation of allergic rhinitis and allergic conjunctivitis. He does have a history if interstitial lung disease with rheumatoid arthritis for which he is followed by pulmonology. At today's visit, he reports his allergic rhinitis as moderately well controlled with symptoms including occasional clear rhinorrhea, nasal congestion, occasional sneezing, and frequent post nasal drainage.  He continues montelukast 10 mg once a day on most days, cetirizine 10 mg once a day on most days, and is not currently using nasal saline rinse or nasal steroid spray. Allergic conjunctivitis is reported as moderately well controlled with symptoms including red and itchy eyes especially when he has been outside without his wrap around goggles. He continues Pataday on most days and occasionally uses Systane eye drops. He reports his symptoms of allergic rhinitis and allergic conjunctivitis have improved over the last 2 weeks. He continues to follow up with Dr. Chase Caller at [facility] for evaluation and treatment of interstitial lung disease in the setting of RA. He continues CPAP use every night. His current medications are listed in the chart.    Drug Allergies:  No Known Allergies  Physical Exam: BP (!) 110/56 (BP Location: Left Arm, Patient Position: Sitting, Cuff Size: Normal)   Pulse 74   Temp 97.6 F (36.4 C) (Temporal)   Resp 18   SpO2 94%    Physical Exam Vitals reviewed.  Constitutional:      Appearance: Normal appearance.  HENT:     Head: Normocephalic and atraumatic.     Right  Ear: Tympanic membrane normal.     Left Ear: Tympanic membrane normal.     Nose:     Comments: Bilateral nares slightly erythematous with clear nasal drainage noted. Pharynx normal. Ears normal. Eyes normal.    Mouth/Throat:     Pharynx: Oropharynx is clear.  Eyes:     Conjunctiva/sclera: Conjunctivae normal.  Cardiovascular:     Rate and Rhythm: Normal rate and regular rhythm.     Heart sounds: Normal heart sounds. No murmur heard.   Pulmonary:     Effort: Pulmonary effort is normal.     Breath sounds: Normal breath sounds.     Comments: Lungs clear to auscultation Musculoskeletal:     Cervical back: Normal range of motion and neck supple.  Neurological:     Mental Status: He is alert.    Assessment and Plan: 1. Seasonal and perennial allergic rhinitis   2. Seasonal allergic conjunctivitis   3. ILD (interstitial lung disease) ([facility])     Meds ordered this encounter  Medications  . fluticasone (FLONASE) 50 MCG/ACT nasal spray    Sig: Place 2 sprays into both nostrils daily.    Dispense:  1 g    Refill:  5    Patient Instructions  Allergic rhinitis Continue allergen avoidance measures directed toward grass pollen, weed pollen, tree pollen, and dust mite as listed below Continue cetirizine 10 mg once a day as needed for runny nose or itch Continue montelukast 10 mg once a day to decrease symptoms of allergic rhinitis Restart Flonase 2  sprays in each nostril once a day as needed for a stuffy nose. In the right nostril, point the applicator out toward the right ear. In the left nostril, point the applicator out toward the left ear Consider saline nasal rinses as needed for nasal symptoms. Use this before any medicated nasal sprays for best result  Allergic conjunctivitis Continue olopatadine 1 drop in each eye once a day as needed for red or itchy eyes Recommend using a lubricating eye drop  Call the clinic if this treatment plan is not working well for you  Follow up in  1 year or sooner if needed.   Return in about 1 year (around 08/30/2021), or if symptoms worsen or fail to improve.    Thank you for the opportunity to care for this patient.  Please do not hesitate to contact me with questions.  Gareth Morgan, FNP [facility]

## 2020-08-30 NOTE — Patient Instructions (Signed)
Allergic rhinitis Continue allergen avoidance measures directed toward grass pollen, weed pollen, tree pollen, and dust mite as listed below Continue cetirizine 10 mg once a day as needed for runny nose or itch Continue montelukast 10 mg once a day to decrease symptoms of allergic rhinitis Restart Flonase 2 sprays in each nostril once a day as needed for a stuffy nose. In the right nostril, point the applicator out toward the right ear. In the left nostril, point the applicator out toward the left ear Consider saline nasal rinses as needed for nasal symptoms. Use this before any medicated nasal sprays for best result  Allergic conjunctivitis Continue olopatadine 1 drop in each eye once a day as needed for red or itchy eyes Recommend using a lubricating eye drop  Call the clinic if this treatment plan is not working well for you  Follow up in 1 year or sooner if needed.   Eye Allergies Eye Allergies, also called allergic conjunctivitis, are common in our [place], [place] area. They occur when something (an allergen) irritates the eyes and triggers a reaction which involves release of histamine and  other substances. Symptoms include itchy, red, teary, burning eyes, and itchy, puffy eyelids. Usually both eyes are affected.  Nasal allergies may also be present with an itchy, stuffy nose and sneezing. Allergies often run in families.  Allergies can be seasonal or yearlong. Common allergens include pollen (from grass, trees, ragweed, etc.), mold, pet dander, dust, smoke, perfumes, cosmetics, and even foods. What can be done:  .Avoid the allergen! Glasses/sunglasses and a hat outside can help. Close windows and use air conditioning when the pollen counts are high. Vacuum filters, bedding covers, washing bedding often, and keeping pets out of your bedroom can help. Marland KitchenAvoid rubbing your eyes. .Cool compresses may be soothing. .Artificial Tears can rinse allergens from the eyes. We recommend  Preservative Free. Refrigerating the drops can be soothing. .Reduce/Avoid contact lens wear. If contacts are used, consider instilling an allergy eye drop (such as Pataday) at least 10 minutes before inserting contact lenses. Medications (read labels and warnings): OTC Allergy pills such as antihistamines can help overall symptoms but may worsen eye symptoms due to their drying effect. If used, consider increasing Artificial Tear use. At times, prescription medications may be needed. These include other Antihistamine/Mast Cell Stabilizer drops, NSAID drops, and Steroid medications (which may need monitoring). Allergy testing and evaluation by an Allergist may be helpful. Note: While most allergic conjunctivitis can be mild and annoying, some types can be more serious. We recommend seeking medical attention if your symptoms are severe or persist despite treatment efforts, if there is a thick discharge or pain, if only one eye is involved, if the vision is impaired or you have marked light sensitivity, if you have blisters or a rash, or a history of Herpes or Shingles in or around your eyes. If you've had recent eye surgery or exposure to someone with pinkeye, please call.  Reducing Pollen Exposure The American Academy of Allergy, Asthma and Immunology suggests the following steps to reduce your exposure to pollen during allergy seasons. 1. Do not hang sheets or clothing out to dry; pollen may collect on these items. 2. Do not mow lawns or spend time around freshly cut grass; mowing stirs up pollen. 3. Keep windows closed at night.  Keep car windows closed while driving. 4. Minimize morning activities outdoors, a time when pollen counts are usually at their highest. 5. Stay indoors as much as possible when pollen  counts or humidity is high and on windy days when pollen tends to remain in the air longer. 6. Use air conditioning when possible.  Many air conditioners have filters that trap the  pollen spores. 7. Use a HEPA room air filter to remove pollen form the indoor air you breathe.   Control of Dust Mite Allergen Dust mites play a major role in allergic asthma and rhinitis. They occur in environments with high humidity wherever human skin is found. Dust mites absorb humidity from the atmosphere (ie, they do not drink) and feed on organic matter (including shed human and animal skin). Dust mites are a microscopic type of insect that you cannot see with the naked eye. High levels of dust mites have been detected from mattresses, pillows, carpets, upholstered furniture, bed covers, clothes, soft toys and any woven material. The principal allergen of the dust mite is found in its feces. A gram of dust may contain 1,000 mites and 250,000 fecal particles. Mite antigen is easily measured in the air during house cleaning activities. Dust mites do not bite and do not cause harm to humans, other than by triggering allergies/asthma.  Ways to decrease your exposure to dust mites in your home:  1. Encase mattresses, box springs and pillows with a mite-impermeable barrier or cover  2. Wash sheets, blankets and drapes weekly in hot water (130 F) with detergent and dry them in a dryer on the hot setting.  3. Have the room cleaned frequently with a vacuum cleaner and a damp dust-mop. For carpeting or rugs, vacuuming with a vacuum cleaner equipped with a high-efficiency particulate air (HEPA) filter. The dust mite allergic individual should not be in a room which is being cleaned and should wait 1 hour after cleaning before going into the room.  4. Do not sleep on upholstered furniture (eg, couches).  5. If possible removing carpeting, upholstered furniture and drapery from the home is ideal. Horizontal blinds should be eliminated in the rooms where the person spends the most time (bedroom, study, television room). Washable vinyl, roller-type shades are optimal.  6. Remove all non-washable stuffed  toys from the bedroom. Wash stuffed toys weekly like sheets and blankets above.  7. Reduce indoor humidity to less than 50%. Inexpensive humidity monitors can be purchased at most hardware stores. Do not use a humidifier as can make the problem worse and are not recommended.

## 2020-09-19 DIAGNOSIS — M0579 Rheumatoid arthritis with rheumatoid factor of multiple sites without organ or systems involvement: Secondary | ICD-10-CM | POA: Diagnosis not present

## 2020-09-26 DIAGNOSIS — J849 Interstitial pulmonary disease, unspecified: Secondary | ICD-10-CM | POA: Diagnosis not present

## 2020-09-26 DIAGNOSIS — E785 Hyperlipidemia, unspecified: Secondary | ICD-10-CM | POA: Diagnosis not present

## 2020-09-26 DIAGNOSIS — M069 Rheumatoid arthritis, unspecified: Secondary | ICD-10-CM | POA: Diagnosis not present

## 2020-09-26 DIAGNOSIS — I10 Essential (primary) hypertension: Secondary | ICD-10-CM | POA: Diagnosis not present

## 2020-09-27 DIAGNOSIS — G4733 Obstructive sleep apnea (adult) (pediatric): Secondary | ICD-10-CM | POA: Diagnosis not present

## 2020-10-20 DIAGNOSIS — I10 Essential (primary) hypertension: Secondary | ICD-10-CM | POA: Diagnosis not present

## 2020-10-20 DIAGNOSIS — M069 Rheumatoid arthritis, unspecified: Secondary | ICD-10-CM | POA: Diagnosis not present

## 2020-10-20 DIAGNOSIS — E785 Hyperlipidemia, unspecified: Secondary | ICD-10-CM | POA: Diagnosis not present

## 2020-10-24 DIAGNOSIS — M0510 Rheumatoid lung disease with rheumatoid arthritis of unspecified site: Secondary | ICD-10-CM | POA: Diagnosis not present

## 2020-10-24 DIAGNOSIS — Z0001 Encounter for general adult medical examination with abnormal findings: Secondary | ICD-10-CM | POA: Diagnosis not present

## 2020-10-24 DIAGNOSIS — J301 Allergic rhinitis due to pollen: Secondary | ICD-10-CM | POA: Diagnosis not present

## 2020-10-24 DIAGNOSIS — G4730 Sleep apnea, unspecified: Secondary | ICD-10-CM | POA: Diagnosis not present

## 2020-10-24 DIAGNOSIS — E785 Hyperlipidemia, unspecified: Secondary | ICD-10-CM | POA: Diagnosis not present

## 2020-10-24 DIAGNOSIS — I10 Essential (primary) hypertension: Secondary | ICD-10-CM | POA: Diagnosis not present

## 2020-10-26 DIAGNOSIS — E785 Hyperlipidemia, unspecified: Secondary | ICD-10-CM | POA: Diagnosis not present

## 2020-10-26 DIAGNOSIS — M069 Rheumatoid arthritis, unspecified: Secondary | ICD-10-CM | POA: Diagnosis not present

## 2020-10-26 DIAGNOSIS — J849 Interstitial pulmonary disease, unspecified: Secondary | ICD-10-CM | POA: Diagnosis not present

## 2020-10-26 DIAGNOSIS — I10 Essential (primary) hypertension: Secondary | ICD-10-CM | POA: Diagnosis not present

## 2020-10-27 DIAGNOSIS — G4733 Obstructive sleep apnea (adult) (pediatric): Secondary | ICD-10-CM | POA: Diagnosis not present

## 2020-11-02 DIAGNOSIS — G4733 Obstructive sleep apnea (adult) (pediatric): Secondary | ICD-10-CM | POA: Diagnosis not present

## 2020-11-20 ENCOUNTER — Other Ambulatory Visit: Payer: Self-pay

## 2020-11-20 ENCOUNTER — Ambulatory Visit (INDEPENDENT_AMBULATORY_CARE_PROVIDER_SITE_OTHER)
Admission: RE | Admit: 2020-11-20 | Discharge: 2020-11-20 | Disposition: A | Discharge status: Home / Self Care | Payer: Medicare Other | Source: Ambulatory Visit | Attending: Internal Medicine | Admitting: Internal Medicine

## 2020-11-20 DIAGNOSIS — J849 Interstitial pulmonary disease, unspecified: Secondary | ICD-10-CM

## 2020-11-20 DIAGNOSIS — I700 Atherosclerosis of aorta: Secondary | ICD-10-CM | POA: Diagnosis not present

## 2020-11-26 DIAGNOSIS — E785 Hyperlipidemia, unspecified: Secondary | ICD-10-CM | POA: Diagnosis not present

## 2020-11-26 DIAGNOSIS — M069 Rheumatoid arthritis, unspecified: Secondary | ICD-10-CM | POA: Diagnosis not present

## 2020-11-26 DIAGNOSIS — I10 Essential (primary) hypertension: Secondary | ICD-10-CM | POA: Diagnosis not present

## 2020-12-20 DIAGNOSIS — Z79899 Other long term (current) drug therapy: Secondary | ICD-10-CM | POA: Diagnosis not present

## 2020-12-20 DIAGNOSIS — M0579 Rheumatoid arthritis with rheumatoid factor of multiple sites without organ or systems involvement: Secondary | ICD-10-CM | POA: Diagnosis not present

## 2020-12-20 DIAGNOSIS — J849 Interstitial pulmonary disease, unspecified: Secondary | ICD-10-CM | POA: Diagnosis not present

## 2020-12-27 DIAGNOSIS — I10 Essential (primary) hypertension: Secondary | ICD-10-CM | POA: Diagnosis not present

## 2020-12-27 DIAGNOSIS — E785 Hyperlipidemia, unspecified: Secondary | ICD-10-CM | POA: Diagnosis not present

## 2020-12-27 DIAGNOSIS — M069 Rheumatoid arthritis, unspecified: Secondary | ICD-10-CM | POA: Diagnosis not present

## 2020-12-29 ENCOUNTER — Ambulatory Visit (INDEPENDENT_AMBULATORY_CARE_PROVIDER_SITE_OTHER): Payer: Medicare Other | Admitting: Internal Medicine

## 2020-12-29 ENCOUNTER — Ambulatory Visit: Payer: Medicare Other | Admitting: Internal Medicine

## 2020-12-29 ENCOUNTER — Other Ambulatory Visit: Payer: Self-pay

## 2020-12-29 ENCOUNTER — Encounter: Payer: Self-pay | Admitting: Internal Medicine

## 2020-12-29 VITALS — BP 126/74 | HR 78 | Temp 97.9°F | Ht 68.5 in | Wt 212.0 lb

## 2020-12-29 DIAGNOSIS — J8489 Other specified interstitial pulmonary diseases: Secondary | ICD-10-CM

## 2020-12-29 DIAGNOSIS — M359 Systemic involvement of connective tissue, unspecified: Secondary | ICD-10-CM | POA: Diagnosis not present

## 2020-12-29 DIAGNOSIS — M0510 Rheumatoid lung disease with rheumatoid arthritis of unspecified site: Secondary | ICD-10-CM | POA: Diagnosis not present

## 2020-12-29 DIAGNOSIS — Z79899 Other long term (current) drug therapy: Secondary | ICD-10-CM | POA: Diagnosis not present

## 2020-12-29 DIAGNOSIS — Z79631 Long term (current) use of antimetabolite agent: Secondary | ICD-10-CM

## 2020-12-29 NOTE — Progress Notes (Addendum)
Spirometry/DLCO performed today. 

## 2020-12-29 NOTE — Patient Instructions (Addendum)
Spirometry/DLCO performed today. 

## 2020-12-29 NOTE — Progress Notes (Signed)
OV 07/15/2019  Subjective:  Patient ID: [name], male , DOB: November 27, 1940 , age 80 y.o. , MRN: [id number] , ADDRESS: [place] [address]   07/15/2019 -   Chief Complaint  Patient presents with   Consult    Pt referred by Dr. Ander Slade to see MR for ILD consult. Pt denies any complaints or concerns and states he has been doing fine since last visit.     HPI [name] 80 y.o.  -patient of Dr. Jenetta Downer.  Presented in January 2021 to the hospital with right-sided pneumothorax that was spontaneous.  He was treated with pleurodesis.  I remember meeting him on 1 day during cross cover at that time.  He tells me that throughout the course of 2020 during the time of the pandemic he was having insidious onset of shortness of breath that was progressive.  Then diagnosed with right-sided pneumothorax.  None after the pleurodesis all the symptoms have completely resolved and is at baseline.  Currently is not having any dyspnea.  However high-resolution CT scan of the chest at that time showed evidence of UIP [I personally visualized and interpreted the CT].  He tells me that he has a diagnosis of rheumatoid arthritis for over 20 years.  And for all this 20 years he has been on methotrexate no other agent.  Previously many years ago was on prednisone but currently not on prednisone.  He feels stable.  No cough  He is accompanied by his daughter [name] who is also giving the history.  Referred now to ILD center  [facility] Comprehensive ILD Questionnaire  Symptoms:  -Shortness of breath is actually resolved after treatment of the pneumothorax.   SYMPTOM SCALE - ILD 07/15/2019   O2 use ra  Shortness of Breath 0 -> 5 scale with 5 being worst (score 6 If unable to do)  At rest 0  Simple tasks - showers, clothes change, eating, shaving 0  Household (dishes, doing bed, laundry) 0  Shopping 0  Walking level at own pace 0  Walking up Stairs 0  Total (30-36) Dyspnea Score 0   How bad is your cough? 0  How bad is your fatigue 0  How bad is nausea 0  How bad is vomiting?  0  How bad is diarrhea? 0  How bad is anxiety? 0  How bad is depression 0       Past Medical History : Positive for rheumatoid arthritis for the last several years.  In fact 20 years.  Treated with methotrexate along.  In the remote past was on prednisone.  Also has symptoms of acid reflux here and there.  Otherwise negative.   ROS: Negative for dyspnea fatigue.  Any collagen vascular.  Positive for acid reflux disease for the last few days.  Also has snoring and early morning headaches excessive daytime somnolence for the last few years.   FAMILY HISTORY of LUNG DISEASE: No one in the family other than him has pulmonary fibrosis or any other lung disease.   EXPOSURE HISTORY: Denies any smoking of cigarettes or marijuana or vaping or electronic cigarettes or electronic vaping.  Denies any cocaine or marijuana use.   HOME and HOBBY DETAILS : [place] last 40 years.  No dampness in the house.  No mold or mildew.  No humidifier no CPAP use.  No steam iron use.  No Jacuzzi use.  No misting Fountain use.  No pet birds  or parakeets.  No pet gerbils.  No feather pillows.  No mold in the [facility] duct.  No music habits.  No guarding habits.  No exposure to birds.   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  With inorganic antigens no exposures.   PULMONARY TOXICITY HISTORY (27 items): Positive for methotrexate for many years.  Possibly 2 decades.    Simple office walk 185 feet x  3 laps goal with forehead probe 07/15/2019   O2 used ra  Number laps completed 3  Comments about pace avg  Resting Pulse Ox/HR 100% and 74/min  Final Pulse Ox/HR 95% and 96/min  Desaturated </= 88% no  Desaturated <= 3% points Yes, 5  Got Tachycardic >/= 90/min yes  Symptoms at end of test  none  Miscellaneous comments x     OV 10/05/2019  Subjective:  Patient ID: [name], male , DOB: 04-18-1941 , age 33 y.o. , MRN: [id number] , ADDRESS: [place] [address]   10/05/2019 -   Chief Complaint  Patient presents with   Follow-up    Pt states he has been doing good since last visit and denies any complaints.     HPI [name] 80 y.o. -follows up for his UIP with rheumatoid arthritis interstitial lung disease.  Since January 2021 pneumothorax has not had any further issues.  His respiratory symptoms are stable.  He feels his overall dyspnea is stable.  He only has a mild cough.  There seems to be a slight symptom change compared to March 2021 but he states he is stable.  His wife is here with him and I am meeting her for the first time.  We went over the goals of care.  He is very active and likes to be active.  They are on mind taking medications as long as the side effects can be monitored and it is a medical indication/recommendation to do so.  Today the wife has a new complaint and the concern:Wife is concerned about him snoring.  She has seen him intermittently very occasionally stop breathing in the middle of the night.  In the daytime he has excessive fatigue and sleepiness.  His BMI is less than 35.  His neck circumference was not measured but probably less than 40.  He has hypertension.  His stop bang score is 6 high risk for OSA.    ROS - per HPI  IMPRESSION: CT chest high resoltuon 1. Chronic lung changes compatible with interstitial lung disease, with a spectrum of findings indicative of usual interstitial pneumonia (UIP) per current ATS guidelines. No progression of disease compared to the prior study. 2. Aortic atherosclerosis, in addition to left main and 3 vessel coronary artery disease.   Aortic Atherosclerosis (ICD10-I70.0).     Electronically Signed   By: Vinnie Langton M.D.   On: 09/30/2019 15:50  OV  05/18/2020  Subjective:  Patient ID: [name], male , DOB: 06-Nov-1940 , age 79 y.o. , MRN: [id number] , ADDRESS: [place] [address] PCP [name], MD Patient Care Team: [name], MD as PCP - General (Family Medicine)  This Provider for this visit: Treatment Team:  Attending Provider: Brand Males, MD    05/18/2020 -   Chief Complaint  Patient presents with   Follow-up    Pt states he has been doing good since last visit and denies any complaints.   Follow-up rheumatoid  arthritis related ILD UIP pattern. Follows with Dr. Amil Amen for rheumatoid arthritis On expectant approach with the ILD  HPI Gurkaran Schuch 80 y.o. -presents for follow-up.  Last seen several months ago.  Since then he continues to be stable.  He has mild shortness of breath when laying yard work.  Otherwise he is good.  He sees Dr. Jenetta Downer for sleep apnea.  There are no new issues.  He is on methotrexate which controls his joint pain.  He is up-to-date with his vaccines.  He has not lost any weight.    PFT   OV 12/29/2020  Subjective:  Patient ID: [name], male , DOB: 03/16/1941 , age 40 y.o. , MRN: [id number] , ADDRESS: [place] [address] PCP [name], MD Patient Care Team: [name], MD as PCP - General (Family Medicine)  This Provider for this visit: Treatment Team:  Attending Provider: Brand Males, MD    12/29/2020 -   Chief Complaint  Patient presents with   Follow-up    PFT  performed today.  Pt states he is about the same since last visit.    Follow-up rheumatoid arthritis related ILD UIP pattern. Follows with Dr. Amil Amen for rheumatoid arthritis On expectant approach with the ILD  HPI Liban Joya 80 y.o. -presents for follow-up.  He had high-resolution CT chest July 2022.  It shows chronic stability.  He had his first pulmonary function test.  Previously not done because of pneumothorax.  His FVC is normal.  His DLCO is reduced at  56%.  He says he can probably do better with DLCO if he understood the technique better.  His symptom score is minimal and stable as documented below.  He does not think he is declining.  Of note he is frustrated by the delay.  I was 45 minutes late seeing him.  Had apologized.  We discussed some strategies about how to reduce the frustration of waiting because he also did pulmonary function test today.  Also discussed that I would do my best to try and go on time.    SYMPTOM SCALE - ILD 07/15/2019  10/05/2019  12/29/2020   O2 use ra ra ra  Shortness of Breath 0 -> 5 scale with 5 being worst (score 6 If unable to do)  1  At rest 0 0 1  Simple tasks - showers, clothes change, eating, shaving 0 0 0  Household (dishes, doing bed, laundry) 0 0 0  Shopping 0 0 0  Walking level at own pace 0 2 1  Walking up Stairs 0 0 1  Total (30-36) Dyspnea Score 0 2 3  How bad is your cough? 0 1 1  How bad is your fatigue 0 1 2  How bad is nausea 0 0 0  How bad is vomiting?  0 0 0  How bad is diarrhea? 0 0 0  How bad is anxiety? 0 0 00  How bad is depression 0 0 0     Simple office walk 185 feet x  3 laps goal with forehead probe 07/15/2019  10/05/2019  05/18/2020   O2 used ra ra ra  Number laps completed 3 3   Comments about pace avg 100% and 68 100% and 75/min  Resting Pulse Ox/HR 100% and 74/min 97% and 93 96% and 100/min  Final Pulse Ox/HR 95% and 96/min no   Desaturated </= 88% no no   Desaturated <= 3% points Yes, 5 Uyes, 3   Got Tachycardic >/=  90/min yes none   Symptoms at end of test none none No complaints  Miscellaneous comments x avg pace abg    HRCT 11/20/20  Narrative & Impression  CLINICAL DATA:  Interstitial lung disease   EXAM: CT CHEST WITHOUT CONTRAST   TECHNIQUE: Multidetector CT imaging of the chest was performed following the standard protocol without intravenous contrast. High resolution imaging of the lungs, as well as inspiratory and expiratory imaging, was  performed.   COMPARISON:  09/30/2019, 05/28/2019   FINDINGS: Cardiovascular: Aortic atherosclerosis. Normal heart size. Three-vessel coronary artery calcifications. No pericardial effusion.   Mediastinum/Nodes: No enlarged mediastinal, hilar, or axillary lymph nodes. Small hiatal hernia. Thyroid gland, trachea, and esophagus demonstrate no significant findings.   Lungs/Pleura: Redemonstrated pattern of moderate to severe pulmonary fibrosis with apical to basal gradient featuring irregular peripheral interstitial opacity, subpleural bronchiolectasis, and extensive areas of honeycombing at the bilateral lung bases. Fibrotic findings are not significantly changed compared to prior examinations. No significant air trapping on expiratory phase imaging. Wedge biopsy of the right lung. No pleural effusion or pneumothorax.   Upper Abdomen: No acute abnormality.   Musculoskeletal: No chest wall mass or suspicious bone lesions identified.   IMPRESSION: 1. Redemonstrated pattern of moderate to severe pulmonary fibrosis with apical to basal gradient featuring irregular peripheral interstitial opacity, subpleural bronchiolectasis, and extensive areas of honeycombing at the bilateral lung bases. Fibrotic findings are not significantly changed compared to prior examinations. Findings are consistent with UIP per consensus guidelines: Diagnosis of Idiopathic Pulmonary Fibrosis: An Official ATS/ERS/JRS/ALAT Clinical Practice Guideline. [place], Iss 5, [phone], Dec 28 2016. 2. Coronary artery disease.   Aortic Atherosclerosis (ICD10-I70.0).     Electronically Signed   By: Eddie Candle M.D.   On: 11/20/2020 21:00      No results found.    PFT No flowsheet data found.     has a past medical history of Adenomatous colon polyp, Collapsed lung (04/2018), Diverticulosis, GERD (gastroesophageal reflux disease), Hemorrhoids, Hiatal hernia, Hypertension,  Pneumonia (11/2019), Rheumatoid arthritis ([facility]), and S/P dilatation of esophageal stricture.   reports that he has never smoked. He has never used smokeless tobacco.  Past Surgical History:  Procedure Laterality Date   CIRCUMCISION     HEMORRHOID SURGERY  2002   PLEURADESIS Right 06/14/2019   Procedure: PLEURADESIS USING TALC;  Surgeon: [name], MD;  Location: [facility];  Service: Thoracic;  Laterality: Right;   STAPLING OF BLEBS Right 06/14/2019   Procedure: STAPLING OF BLEBS;  Surgeon: [name], MD;  Location: [facility];  Service: Thoracic;  Laterality: Right;   VIDEO ASSISTED THORACOSCOPY Right 06/14/2019   Procedure: VIDEO ASSISTED THORACOSCOPY WITH RIGHT UPPER LOBE WEDGE;  Surgeon: [name], MD;  Location: [facility];  Service: Thoracic;  Laterality: Right;    No Known Allergies  Immunization History  Administered Date(s) Administered   Influenza, High Dose Seasonal PF 01/31/2018, 12/15/2018   Influenza,inj,Quad PF,6+ Mos 01/24/2020   PFIZER(Purple Top)SARS-COV-2 Vaccination 05/21/2019, 06/24/2019, 04/10/2020    Family History  Problem Relation Age of Onset   Prostate cancer Paternal Uncle    Colon cancer Neg Hx    Throat cancer Neg Hx    Diabetes Neg Hx    Kidney disease Neg Hx    Liver disease Neg Hx      Current Outpatient Medications:    acetaminophen (TYLENOL) 500 MG tablet, Take 2 tablets (1,000 mg total) by mouth every 6 (six) hours. (Patient  taking differently: Take 1,000 mg by mouth every 6 (six) hours as needed.), Disp: 30 tablet, Rfl: 0   amLODipine (NORVASC) 10 MG tablet, Take 1 tablet (10 mg total) by mouth daily., Disp: 30 tablet, Rfl: 0   aspirin EC 81 MG tablet, Take 81 mg by mouth daily., Disp: , Rfl:    cetirizine (ZYRTEC) 10 MG tablet, Take 1 tablet 1-2 times daily as needed., Disp: 60 tablet, Rfl: 5   FARXIGA 10 MG TABS tablet, 10 mg daily. , Disp: , Rfl:    fluticasone (FLONASE) 50 MCG/ACT nasal spray, Place 2 sprays into both nostrils  daily., Disp: 1 g, Rfl: 5   FOLIC ACID PO, Take 1 tablet by mouth daily., Disp: , Rfl:    Menthol-Methyl Salicylate (MUSCLE RUB) 10-15 % CREA, Apply 1 application topically as needed for muscle pain (shoulder/neck)., Disp: , Rfl:    methotrexate (RHEUMATREX) 2.5 MG tablet, Take 10 mg by mouth See admin instructions. Take '10mg'$  every Thursday and Friday, Disp: , Rfl:    montelukast (SINGULAIR) 10 MG tablet, TAKE 1 TABLET BY MOUTH EVERYDAY AT BEDTIME, Disp: 90 tablet, Rfl: 2   Olopatadine HCl (PATADAY) 0.2 % SOLN, Use 1 drop in each eye once daily as needed., Disp: 2.5 mL, Rfl: 5   omeprazole (PRILOSEC) 20 MG capsule, Take 1 capsule (20 mg total) by mouth daily., Disp: 90 capsule, Rfl: 3   valsartan-hydrochlorothiazide (DIOVAN-HCT) 320-25 MG tablet, Take 1 tablet by mouth daily., Disp: , Rfl:       Objective:   Vitals:   12/29/20 1459  BP: 126/74  Pulse: 78  Temp: 97.9 F (36.6 C)  TempSrc: Oral  SpO2: 97%  Weight: 212 lb (96.2 kg)  Height: 5' 8.5" (1.74 m)    Estimated body mass index is 31.77 kg/m as calculated from the following:   Height as of this encounter: 5' 8.5" (1.74 m).   Weight as of this encounter: 212 lb (96.2 kg).  '@WEIGHTCHANGE'$ @  [employer]   12/29/20 1459  Weight: 212 lb (96.2 kg)     Physical Exam General: No distress. Looks well Neuro: Alert and Oriented x 3. GCS 15. Speech normal Psych: Pleasant Resp:  Barrel Chest - no.  Wheeze - no, Crackles - yes, No overt respiratory distress CVS: Normal heart sounds. Murmurs - no Ext: Stigmata of Connective Tissue Disease - no HEENT: Normal upper airway. PEERL +. No post nasal drip        Assessment:       ICD-10-CM   1. Interstitial lung disease due to connective tissue disease ([facility])  J84.89 Pulmonary function test   M35.9     2. Methotrexate, long term, current use  Z79.899     3. Rheumatoid lung disease ([facility])  M05.10          Plan:     Patient Instructions     ICD-10-CM   1. Interstitial  lung disease due to connective tissue disease ([facility])  J84.89    M35.9   2. Methotrexate, long term, current use  Z79.899   3. Rheumatoid lung disease ([facility])  M05.10   4. UIP (usual interstitial pneumonitis) ([facility])  J84.112     Clinically stable in CT scan First Breathing test shows reduction in diffusion - overall burden of disease is mild Apologize for delay in seeing you  Plan - Do spirometry and DLCO in 6 months - can do different day from appt --If there is any evidence of progression then we can consider  you for antifibrotic's -Meanwhile avoid respiratory viruses because they are common reason for flareup which can be detrimental to pulmonary fibrosis  Follow-up -6 months or sooner if needed bu after breathing test  - to avoid wait frustration book pft and visit appt on different days  - book visit appt first 1-2 appointments for the morning or afternoon    SIGNATURE    Dr. Brand Males, M.D., F.C.C.P,  Pulmonary and Critical Care Medicine Staff Physician, [place] Director - Interstitial Lung Disease  Program  [facility] [place], [place], [address]  Pager: [phone], If no answer or between  15:00h - 7:00h: call [phone] Telephone: [phone]  4:06 PM 12/29/2020

## 2020-12-29 NOTE — Patient Instructions (Addendum)
ICD-10-CM   1. Interstitial lung disease due to connective tissue disease ([facility])  J84.89    M35.9   2. Methotrexate, long term, current use  Z79.899   3. Rheumatoid lung disease ([facility])  M05.10   4. UIP (usual interstitial pneumonitis) ([facility])  J84.112     Clinically stable in CT scan First Breathing test shows reduction in diffusion - overall burden of disease is mild Apologize for delay in seeing you  Plan - Do spirometry and DLCO in 6 months - can do different day from appt --If there is any evidence of progression then we can consider you for antifibrotic's -Meanwhile avoid respiratory viruses because they are common reason for flareup which can be detrimental to pulmonary fibrosis  Follow-up -6 months or sooner if needed bu after breathing test  - to avoid wait frustration book pft and visit appt on different days  - book visit appt first 1-2 appointments for the morning or afternoon

## 2021-01-08 LAB — PULMONARY FUNCTION TEST
DL/VA % pred: 90 %
DL/VA: 3.52 ml/min/mmHg/L
DLCO cor % pred: 56 %
DLCO cor: 13.19 ml/min/mmHg
DLCO unc % pred: 56 %
DLCO unc: 13.19 ml/min/mmHg
FEF 25-75 Pre: 0.9 L/sec
FEF2575-%Pred-Pre: 47 %
FEV1-%Pred-Pre: 77 %
FEV1-Pre: 1.91 L
FEV1FVC-%Pred-Pre: 86 %
FEV6-%Pred-Pre: 92 %
FEV6-Pre: 2.96 L
FEV6FVC-%Pred-Pre: 106 %
FVC-%Pred-Pre: 86 %
FVC-Pre: 2.96 L
Pre FEV1/FVC ratio: 64 %
Pre FEV6/FVC Ratio: 100 %

## 2021-01-26 DIAGNOSIS — I10 Essential (primary) hypertension: Secondary | ICD-10-CM | POA: Diagnosis not present

## 2021-01-26 DIAGNOSIS — E785 Hyperlipidemia, unspecified: Secondary | ICD-10-CM | POA: Diagnosis not present

## 2021-01-26 DIAGNOSIS — M069 Rheumatoid arthritis, unspecified: Secondary | ICD-10-CM | POA: Diagnosis not present

## 2021-01-26 DIAGNOSIS — J849 Interstitial pulmonary disease, unspecified: Secondary | ICD-10-CM | POA: Diagnosis not present

## 2021-01-31 DIAGNOSIS — G4733 Obstructive sleep apnea (adult) (pediatric): Secondary | ICD-10-CM | POA: Diagnosis not present

## 2021-02-16 ENCOUNTER — Other Ambulatory Visit: Payer: Self-pay | Admitting: Internal Medicine

## 2021-02-20 DIAGNOSIS — J849 Interstitial pulmonary disease, unspecified: Secondary | ICD-10-CM | POA: Diagnosis not present

## 2021-02-20 DIAGNOSIS — E785 Hyperlipidemia, unspecified: Secondary | ICD-10-CM | POA: Diagnosis not present

## 2021-02-20 DIAGNOSIS — I10 Essential (primary) hypertension: Secondary | ICD-10-CM | POA: Diagnosis not present

## 2021-02-20 DIAGNOSIS — M069 Rheumatoid arthritis, unspecified: Secondary | ICD-10-CM | POA: Diagnosis not present

## 2021-02-23 DIAGNOSIS — I10 Essential (primary) hypertension: Secondary | ICD-10-CM | POA: Diagnosis not present

## 2021-02-23 DIAGNOSIS — G4730 Sleep apnea, unspecified: Secondary | ICD-10-CM | POA: Diagnosis not present

## 2021-02-23 DIAGNOSIS — E785 Hyperlipidemia, unspecified: Secondary | ICD-10-CM | POA: Diagnosis not present

## 2021-02-26 DIAGNOSIS — M069 Rheumatoid arthritis, unspecified: Secondary | ICD-10-CM | POA: Diagnosis not present

## 2021-02-26 DIAGNOSIS — I10 Essential (primary) hypertension: Secondary | ICD-10-CM | POA: Diagnosis not present

## 2021-02-26 DIAGNOSIS — E785 Hyperlipidemia, unspecified: Secondary | ICD-10-CM | POA: Diagnosis not present

## 2021-03-20 ENCOUNTER — Ambulatory Visit (INDEPENDENT_AMBULATORY_CARE_PROVIDER_SITE_OTHER): Payer: Medicare Other | Admitting: Allergy and Immunology

## 2021-03-20 ENCOUNTER — Other Ambulatory Visit: Payer: Self-pay

## 2021-03-20 VITALS — BP 108/58 | HR 78 | Temp 97.9°F | Resp 16 | Ht 67.5 in | Wt 213.2 lb

## 2021-03-20 DIAGNOSIS — J302 Other seasonal allergic rhinitis: Secondary | ICD-10-CM | POA: Diagnosis not present

## 2021-03-20 DIAGNOSIS — J3089 Other allergic rhinitis: Secondary | ICD-10-CM

## 2021-03-20 DIAGNOSIS — M0579 Rheumatoid arthritis with rheumatoid factor of multiple sites without organ or systems involvement: Secondary | ICD-10-CM | POA: Diagnosis not present

## 2021-03-20 DIAGNOSIS — H1010 Acute atopic conjunctivitis, unspecified eye: Secondary | ICD-10-CM

## 2021-03-20 DIAGNOSIS — H1013 Acute atopic conjunctivitis, bilateral: Secondary | ICD-10-CM

## 2021-03-20 DIAGNOSIS — J849 Interstitial pulmonary disease, unspecified: Secondary | ICD-10-CM

## 2021-03-20 MED ORDER — FLUTICASONE PROPIONATE 50 MCG/ACT NA SUSP: 2.0000 | Freq: Every day | NASAL | 11 refills | Status: AC

## 2021-03-20 MED ORDER — MONTELUKAST SODIUM 10 MG PO TABS: 10.0000 mg | ORAL_TABLET | Freq: Every day | ORAL | 4 refills | Status: AC

## 2021-03-20 MED ORDER — OLOPATADINE HCL 0.2 % OP SOLN: 1.0000 [drp] | Freq: Every day | OPHTHALMIC | 11 refills | Status: AC

## 2021-03-20 MED ORDER — CETIRIZINE HCL 10 MG PO TABS: 10.0000 mg | ORAL_TABLET | Freq: Two times a day (BID) | ORAL | 11 refills | Status: AC | PRN

## 2021-03-20 NOTE — Patient Instructions (Addendum)
  1.  Continue to perform allergen avoidance measures as best as possible- pollens, dust mite  2.  Continue to treat and prevent inflammation:   A.  Montelukast 10 mg - 1 tablet 1 time per day  B.  Fluticasone - 1-2 sprays each nostril 1-7 times per week  3.  If needed:   A.  Zyrtec 10 - 1 tablet 1-2 times per day  B.  Pataday / olopatadine 0.2% - 1 drop each eye 1 time per day  4.  Return to clinic in 12 months or earlier if problem

## 2021-03-20 NOTE — Progress Notes (Signed)
[facility]   Follow-up Note  Referring Provider: [name], MD Primary Provider: [name], MD Date of Office Visit: 03/20/2021  Subjective:   [name] (DOB: [birth year]) is a 80 y.o. male who returns to the [facility] [place] on 03/20/2021 in re-evaluation of the following:  HPI: [name] returns to this clinic in evaluation of allergic rhinoconjunctivitis in the context of interstitial lung disease secondary to rheumatoid arthritis followed by pulmonary.  I last saw him in this clinic on 14 March 2020.  For the most part he has done very well regarding his eyes and nose in regard to his atopic disease while he continues to use an antihistamine, a leukotriene modifier, and some very rare nasal steroid.  Occasionally he has some sneezing and some drainage and some rhinorrhea but for the most part he feels as though he has this condition under pretty good control.  He can now go through each season of the year without much difficulty.  He continues to follow with pulmonary regarding his interstitial lung disease which at this point in time is deemed to be stable without the need for intervention.  Apparently he did have a walk test last year and did not develop deoxygenation during that test.  He continues to use his CPAP for his obstructive sleep apnea successful.  He has received 5 COVID vaccines and has received this year's flu vaccine  Allergies as of 03/20/2021   No Known Allergies      Medication List    acetaminophen 500 MG tablet Commonly known as: TYLENOL Take 2 tablets (1,000 mg total) by mouth every 6 (six) hours.   amLODipine 10 MG tablet Commonly known as: NORVASC Take 1 tablet (10 mg total) by mouth daily.   aspirin EC 81 MG tablet Take 81 mg by mouth daily.   cetirizine 10 MG tablet Commonly known as: ZYRTEC Take 1 tablet 1-2 times daily as needed.   Farxiga 10 MG Tabs tablet Generic  drug: dapagliflozin propanediol 10 mg daily.   fluticasone 50 MCG/ACT nasal spray Commonly known as: Flonase Place 2 sprays into both nostrils daily.   FOLIC ACID PO Take 1 tablet by mouth daily.   methotrexate 2.5 MG tablet Commonly known as: RHEUMATREX Take 10 mg by mouth See admin instructions. Take 10mg  every Thursday and Friday   montelukast 10 MG tablet Commonly known as: SINGULAIR TAKE 1 TABLET BY MOUTH EVERYDAY AT BEDTIME   Muscle Rub 10-15 % Crea Apply 1 application topically as needed for muscle pain (shoulder/neck).   Olopatadine HCl 0.2 % Soln Commonly known as: Pataday Use 1 drop in each eye once daily as needed.   omeprazole 20 MG capsule Commonly known as: PRILOSEC TAKE 1 CAPSULE BY MOUTH EVERY DAY   valsartan-hydrochlorothiazide 320-25 MG tablet Commonly known as: DIOVAN-HCT Take 1 tablet by mouth daily.    Past Medical History:  Diagnosis Date   Adenomatous colon polyp    Collapsed lung 04/2018   Diverticulosis    GERD (gastroesophageal reflux disease)    Hemorrhoids    internal and external   Hiatal hernia    Hypertension    Pneumonia 11/2019   Rheumatoid arthritis ([place])    S/P dilatation of esophageal stricture     Past Surgical History:  Procedure Laterality Date   CIRCUMCISION     HEMORRHOID SURGERY  2002   PLEURADESIS Right 06/14/2019   Procedure: PLEURADESIS USING TALC;  Surgeon: Prescott Gum,  Collier Salina, MD;  Location: [place];  Service: Thoracic;  Laterality: Right;   STAPLING OF BLEBS Right 06/14/2019   Procedure: STAPLING OF BLEBS;  Surgeon: [name], MD;  Location: [facility] [place];  Service: Thoracic;  Laterality: Right;   VIDEO ASSISTED THORACOSCOPY Right 06/14/2019   Procedure: VIDEO ASSISTED THORACOSCOPY WITH RIGHT UPPER LOBE WEDGE;  Surgeon: [name], MD;  Location: [facility];  Service: Thoracic;  Laterality: Right;    Review of systems negative except as noted in HPI / PMHx or noted below:  Review of Systems  Constitutional:  Negative.   HENT: Negative.    Eyes: Negative.   Respiratory: Negative.    Cardiovascular: Negative.   Gastrointestinal: Negative.   Genitourinary: Negative.   Musculoskeletal: Negative.   Skin: Negative.   Neurological: Negative.   Endo/Heme/Allergies: Negative.   Psychiatric/Behavioral: Negative.      Objective:   Vitals:   03/20/21 0859  BP: (!) 108/58  Pulse: 78  Resp: 16  Temp: 97.9 F (36.6 C)  SpO2: 98%   Height: 5' 7.5" (171.5 cm)  Weight: 213 lb 3.2 oz (96.7 kg)   Physical Exam Constitutional:      Appearance: He is not diaphoretic.  HENT:     Head: Normocephalic.     Right Ear: Tympanic membrane, ear canal and external ear normal.     Left Ear: Tympanic membrane, ear canal and external ear normal.     Nose: Nose normal. No mucosal edema or rhinorrhea.     Mouth/Throat:     Pharynx: Uvula midline. No oropharyngeal exudate.  Eyes:     Conjunctiva/sclera: Conjunctivae normal.  Neck:     Thyroid: No thyromegaly.     Trachea: Trachea normal. No tracheal tenderness or tracheal deviation.  Cardiovascular:     Rate and Rhythm: Normal rate and regular rhythm.     Heart sounds: Normal heart sounds, S1 normal and S2 normal. No murmur heard. Pulmonary:     Effort: No respiratory distress.     Breath sounds: Normal breath sounds. No stridor. No wheezing (Inspiratory crackles throughout lung fields) or rales.  Lymphadenopathy:     Head:     Right side of head: No tonsillar adenopathy.     Left side of head: No tonsillar adenopathy.     Cervical: No cervical adenopathy.  Skin:    Findings: No erythema or rash.     Nails: There is no clubbing.  Neurological:     Mental Status: He is alert.    Diagnostics: none  Assessment and Plan:   1. Seasonal and perennial allergic rhinitis   2. Seasonal allergic conjunctivitis   3. ILD (interstitial lung disease) ([place])     1.  Continue to perform allergen avoidance measures as best as possible- pollens, dust  mite  2.  Continue to treat and prevent inflammation:   A.  Montelukast 10 mg - 1 tablet 1 time per day  B.  Fluticasone - 1-2 sprays each nostril 1-7 times per week  3.  If needed:   A.  Zyrtec 10 - 1 tablet 1-2 times per day  B.  Pataday / olopatadine 0.2% - 1 drop each eye 1 time per day   4.  Return to clinic in 12 months or earlier if problem  Jonnatan appears to be doing quite well and has a good understanding of his atopic disease and his requirement for medications depending on his disease activity.  We will refill all his medications and see  him back in this clinic in 1 year or earlier if there is a problem.  He will follow-up with pulmonary concerning his interstitial lung disease.  Allena Katz, MD Allergy / Immunology [place]

## 2021-03-21 ENCOUNTER — Encounter: Payer: Self-pay | Admitting: Allergy and Immunology

## 2021-03-28 DIAGNOSIS — I10 Essential (primary) hypertension: Secondary | ICD-10-CM | POA: Diagnosis not present

## 2021-03-28 DIAGNOSIS — J849 Interstitial pulmonary disease, unspecified: Secondary | ICD-10-CM | POA: Diagnosis not present

## 2021-03-28 DIAGNOSIS — E785 Hyperlipidemia, unspecified: Secondary | ICD-10-CM | POA: Diagnosis not present

## 2021-03-28 DIAGNOSIS — M069 Rheumatoid arthritis, unspecified: Secondary | ICD-10-CM | POA: Diagnosis not present

## 2021-03-29 DIAGNOSIS — H401222 Low-tension glaucoma, left eye, moderate stage: Secondary | ICD-10-CM | POA: Diagnosis not present

## 2021-03-29 DIAGNOSIS — H04123 Dry eye syndrome of bilateral lacrimal glands: Secondary | ICD-10-CM | POA: Diagnosis not present

## 2021-03-29 DIAGNOSIS — H401211 Low-tension glaucoma, right eye, mild stage: Secondary | ICD-10-CM | POA: Diagnosis not present

## 2021-04-10 DIAGNOSIS — H401222 Low-tension glaucoma, left eye, moderate stage: Secondary | ICD-10-CM | POA: Diagnosis not present

## 2021-04-18 DIAGNOSIS — M069 Rheumatoid arthritis, unspecified: Secondary | ICD-10-CM | POA: Diagnosis not present

## 2021-04-18 DIAGNOSIS — E785 Hyperlipidemia, unspecified: Secondary | ICD-10-CM | POA: Diagnosis not present

## 2021-04-18 DIAGNOSIS — I10 Essential (primary) hypertension: Secondary | ICD-10-CM | POA: Diagnosis not present

## 2021-04-24 DIAGNOSIS — H401211 Low-tension glaucoma, right eye, mild stage: Secondary | ICD-10-CM | POA: Diagnosis not present

## 2021-04-27 DIAGNOSIS — J849 Interstitial pulmonary disease, unspecified: Secondary | ICD-10-CM | POA: Diagnosis not present

## 2021-04-27 DIAGNOSIS — M069 Rheumatoid arthritis, unspecified: Secondary | ICD-10-CM | POA: Diagnosis not present

## 2021-04-27 DIAGNOSIS — I10 Essential (primary) hypertension: Secondary | ICD-10-CM | POA: Diagnosis not present

## 2021-04-27 DIAGNOSIS — E785 Hyperlipidemia, unspecified: Secondary | ICD-10-CM | POA: Diagnosis not present

## 2021-05-07 DIAGNOSIS — G4733 Obstructive sleep apnea (adult) (pediatric): Secondary | ICD-10-CM | POA: Diagnosis not present

## 2021-05-09 DIAGNOSIS — H401211 Low-tension glaucoma, right eye, mild stage: Secondary | ICD-10-CM | POA: Diagnosis not present

## 2021-05-09 DIAGNOSIS — H401222 Low-tension glaucoma, left eye, moderate stage: Secondary | ICD-10-CM | POA: Diagnosis not present

## 2021-05-27 DIAGNOSIS — M069 Rheumatoid arthritis, unspecified: Secondary | ICD-10-CM | POA: Diagnosis not present

## 2021-05-27 DIAGNOSIS — E785 Hyperlipidemia, unspecified: Secondary | ICD-10-CM | POA: Diagnosis not present

## 2021-05-27 DIAGNOSIS — I10 Essential (primary) hypertension: Secondary | ICD-10-CM | POA: Diagnosis not present

## 2021-05-27 DIAGNOSIS — J849 Interstitial pulmonary disease, unspecified: Secondary | ICD-10-CM | POA: Diagnosis not present

## 2021-06-08 ENCOUNTER — Telehealth: Payer: Self-pay | Admitting: Internal Medicine

## 2021-06-08 DIAGNOSIS — Z9989 Dependence on other enabling machines and devices: Secondary | ICD-10-CM

## 2021-06-08 DIAGNOSIS — G4733 Obstructive sleep apnea (adult) (pediatric): Secondary | ICD-10-CM

## 2021-06-08 NOTE — Telephone Encounter (Signed)
Spoke with pt who states C-pap is not working. Pt had talked [employer] who stated they needed a order to repair C-pap. Order was placed and pt notified. Nothing further needed at this time.

## 2021-06-21 DIAGNOSIS — E785 Hyperlipidemia, unspecified: Secondary | ICD-10-CM | POA: Diagnosis not present

## 2021-06-21 DIAGNOSIS — G4730 Sleep apnea, unspecified: Secondary | ICD-10-CM | POA: Diagnosis not present

## 2021-06-21 DIAGNOSIS — M069 Rheumatoid arthritis, unspecified: Secondary | ICD-10-CM | POA: Diagnosis not present

## 2021-06-22 ENCOUNTER — Other Ambulatory Visit: Payer: Self-pay

## 2021-06-22 ENCOUNTER — Ambulatory Visit
Admission: EM | Admit: 2021-06-22 | Discharge: 2021-06-22 | Disposition: A | Discharge status: Other Acute Inpatient Hospital | Payer: Medicare Other

## 2021-06-22 ENCOUNTER — Emergency Department (HOSPITAL_COMMUNITY): Payer: Medicare Other

## 2021-06-22 ENCOUNTER — Inpatient Hospital Stay (HOSPITAL_COMMUNITY)
Admission: EM | Admit: 2021-06-22 | Discharge: 2021-06-25 | DRG: 193 | Disposition: A | Discharge status: Home / Self Care | Payer: Medicare Other | Attending: Internal Medicine | Admitting: Internal Medicine

## 2021-06-22 ENCOUNTER — Encounter (HOSPITAL_COMMUNITY): Payer: Self-pay

## 2021-06-22 DIAGNOSIS — G4733 Obstructive sleep apnea (adult) (pediatric): Secondary | ICD-10-CM | POA: Diagnosis present

## 2021-06-22 DIAGNOSIS — K222 Esophageal obstruction: Secondary | ICD-10-CM | POA: Diagnosis not present

## 2021-06-22 DIAGNOSIS — J189 Pneumonia, unspecified organism: Principal | ICD-10-CM

## 2021-06-22 DIAGNOSIS — N1832 Chronic kidney disease, stage 3b: Secondary | ICD-10-CM | POA: Diagnosis not present

## 2021-06-22 DIAGNOSIS — Z86010 Personal history of colonic polyps: Secondary | ICD-10-CM | POA: Diagnosis not present

## 2021-06-22 DIAGNOSIS — I129 Hypertensive chronic kidney disease with stage 1 through stage 4 chronic kidney disease, or unspecified chronic kidney disease: Secondary | ICD-10-CM | POA: Diagnosis not present

## 2021-06-22 DIAGNOSIS — J8410 Pulmonary fibrosis, unspecified: Secondary | ICD-10-CM

## 2021-06-22 DIAGNOSIS — K5790 Diverticulosis of intestine, part unspecified, without perforation or abscess without bleeding: Secondary | ICD-10-CM | POA: Diagnosis present

## 2021-06-22 DIAGNOSIS — Z8042 Family history of malignant neoplasm of prostate: Secondary | ICD-10-CM | POA: Diagnosis not present

## 2021-06-22 DIAGNOSIS — Z7982 Long term (current) use of aspirin: Secondary | ICD-10-CM | POA: Diagnosis not present

## 2021-06-22 DIAGNOSIS — K219 Gastro-esophageal reflux disease without esophagitis: Secondary | ICD-10-CM | POA: Diagnosis present

## 2021-06-22 DIAGNOSIS — J9601 Acute respiratory failure with hypoxia: Secondary | ICD-10-CM | POA: Diagnosis not present

## 2021-06-22 DIAGNOSIS — R0609 Other forms of dyspnea: Secondary | ICD-10-CM | POA: Diagnosis not present

## 2021-06-22 DIAGNOSIS — J84112 Idiopathic pulmonary fibrosis: Secondary | ICD-10-CM | POA: Diagnosis present

## 2021-06-22 DIAGNOSIS — R7981 Abnormal blood-gas level: Secondary | ICD-10-CM | POA: Diagnosis not present

## 2021-06-22 DIAGNOSIS — R0602 Shortness of breath: Secondary | ICD-10-CM

## 2021-06-22 DIAGNOSIS — M069 Rheumatoid arthritis, unspecified: Secondary | ICD-10-CM | POA: Diagnosis present

## 2021-06-22 DIAGNOSIS — Z79899 Other long term (current) drug therapy: Secondary | ICD-10-CM

## 2021-06-22 DIAGNOSIS — Z20822 Contact with and (suspected) exposure to covid-19: Secondary | ICD-10-CM | POA: Diagnosis present

## 2021-06-22 DIAGNOSIS — J849 Interstitial pulmonary disease, unspecified: Secondary | ICD-10-CM | POA: Diagnosis present

## 2021-06-22 DIAGNOSIS — Z743 Need for continuous supervision: Secondary | ICD-10-CM | POA: Diagnosis not present

## 2021-06-22 DIAGNOSIS — J302 Other seasonal allergic rhinitis: Secondary | ICD-10-CM | POA: Diagnosis present

## 2021-06-22 DIAGNOSIS — K449 Diaphragmatic hernia without obstruction or gangrene: Secondary | ICD-10-CM | POA: Diagnosis present

## 2021-06-22 DIAGNOSIS — I10 Essential (primary) hypertension: Secondary | ICD-10-CM

## 2021-06-22 DIAGNOSIS — R0902 Hypoxemia: Secondary | ICD-10-CM | POA: Diagnosis not present

## 2021-06-22 DIAGNOSIS — R6889 Other general symptoms and signs: Secondary | ICD-10-CM | POA: Diagnosis not present

## 2021-06-22 DIAGNOSIS — R7982 Elevated C-reactive protein (CRP): Secondary | ICD-10-CM | POA: Diagnosis present

## 2021-06-22 DIAGNOSIS — D649 Anemia, unspecified: Secondary | ICD-10-CM | POA: Diagnosis not present

## 2021-06-22 LAB — CBC WITH DIFFERENTIAL/PLATELET
Abs Immature Granulocytes: 0.04 10*3/uL (ref 0.00–0.07)
Basophils Absolute: 0 10*3/uL (ref 0.0–0.1)
Basophils Relative: 1 %
Eosinophils Absolute: 0.2 10*3/uL (ref 0.0–0.5)
Eosinophils Relative: 2 %
HCT: 40.8 % (ref 39.0–52.0)
Hemoglobin: 13.7 g/dL (ref 13.0–17.0)
Immature Granulocytes: 1 %
Lymphocytes Relative: 5 %
Lymphs Abs: 0.4 10*3/uL — ABNORMAL LOW (ref 0.7–4.0)
MCH: 31.4 pg (ref 26.0–34.0)
MCHC: 33.6 g/dL (ref 30.0–36.0)
MCV: 93.6 fL (ref 80.0–100.0)
Monocytes Absolute: 1 10*3/uL (ref 0.1–1.0)
Monocytes Relative: 14 %
Neutro Abs: 5.9 10*3/uL (ref 1.7–7.7)
Neutrophils Relative %: 77 %
Platelets: 195 10*3/uL (ref 150–400)
RBC: 4.36 MIL/uL (ref 4.22–5.81)
RDW: 15.2 % (ref 11.5–15.5)
WBC: 7.6 10*3/uL (ref 4.0–10.5)
nRBC: 0 % (ref 0.0–0.2)

## 2021-06-22 LAB — RESP PANEL BY RT-PCR (FLU A&B, COVID) ARPGX2
Influenza A by PCR: NEGATIVE
Influenza B by PCR: NEGATIVE
SARS Coronavirus 2 by RT PCR: NEGATIVE

## 2021-06-22 LAB — BASIC METABOLIC PANEL
Anion gap: 11 (ref 5–15)
BUN: 26 mg/dL — ABNORMAL HIGH (ref 8–23)
CO2: 22 mmol/L (ref 22–32)
Calcium: 9.2 mg/dL (ref 8.9–10.3)
Chloride: 103 mmol/L (ref 98–111)
Creatinine, Ser: 1.7 mg/dL — ABNORMAL HIGH (ref 0.61–1.24)
GFR, Estimated: 40 mL/min — ABNORMAL LOW (ref >60)
Glucose, Bld: 120 mg/dL — ABNORMAL HIGH (ref 70–99)
Potassium: 4.5 mmol/L (ref 3.5–5.1)
Sodium: 136 mmol/L (ref 135–145)

## 2021-06-22 LAB — BRAIN NATRIURETIC PEPTIDE: B Natriuretic Peptide: 20.8 pg/mL (ref 0.0–100.0)

## 2021-06-22 LAB — TROPONIN I (HIGH SENSITIVITY): Troponin I (High Sensitivity): 6 ng/L (ref <18)

## 2021-06-22 MED ORDER — AMLODIPINE BESYLATE 10 MG PO TABS
10.0000 mg | ORAL_TABLET | Freq: Every day | ORAL | Status: DC
  Administered 2021-06-23 – 2021-06-25 (×3): 10 mg via ORAL
  Filled 2021-06-22 (×3): qty 1

## 2021-06-22 MED ORDER — PREDNISONE 20 MG PO TABS
40.0000 mg | ORAL_TABLET | Freq: Every day | ORAL | Status: DC
  Administered 2021-06-23 – 2021-06-25 (×3): 40 mg via ORAL
  Filled 2021-06-22 (×3): qty 2

## 2021-06-22 MED ORDER — SODIUM CHLORIDE 0.9 % IV SOLN
1.0000 g | Freq: Once | INTRAVENOUS | Status: AC
  Administered 2021-06-22: 1 g via INTRAVENOUS
  Filled 2021-06-22: qty 10

## 2021-06-22 MED ORDER — METHOTREXATE 2.5 MG PO TABS: 10.0000 mg | ORAL_TABLET | ORAL | Status: DC

## 2021-06-22 MED ORDER — MONTELUKAST SODIUM 10 MG PO TABS
10.0000 mg | ORAL_TABLET | Freq: Every day | ORAL | Status: DC
  Administered 2021-06-23 – 2021-06-24 (×3): 10 mg via ORAL
  Filled 2021-06-22 (×4): qty 1

## 2021-06-22 MED ORDER — SODIUM CHLORIDE 0.9% FLUSH
3.0000 mL | Freq: Two times a day (BID) | INTRAVENOUS | Status: DC
  Administered 2021-06-22 – 2021-06-25 (×6): 3 mL via INTRAVENOUS

## 2021-06-22 MED ORDER — ENOXAPARIN SODIUM 40 MG/0.4ML IJ SOSY
40.0000 mg | PREFILLED_SYRINGE | INTRAMUSCULAR | Status: DC
  Administered 2021-06-22 – 2021-06-24 (×3): 40 mg via SUBCUTANEOUS
  Filled 2021-06-22 (×3): qty 0.4

## 2021-06-22 MED ORDER — DEXTROSE 5 % IV SOLN
250.0000 mg | INTRAVENOUS | Status: DC
  Administered 2021-06-23 – 2021-06-24 (×2): 250 mg via INTRAVENOUS
  Filled 2021-06-22 (×3): qty 2.5

## 2021-06-22 MED ORDER — HYDROCHLOROTHIAZIDE 25 MG PO TABS: 25.0000 mg | ORAL_TABLET | Freq: Every day | ORAL | Status: DC

## 2021-06-22 MED ORDER — PANTOPRAZOLE SODIUM 40 MG PO TBEC
40.0000 mg | DELAYED_RELEASE_TABLET | Freq: Every day | ORAL | Status: DC
Start: 2021-06-23 — End: 2021-06-25
  Administered 2021-06-23 – 2021-06-25 (×3): 40 mg via ORAL
  Filled 2021-06-22 (×3): qty 1

## 2021-06-22 MED ORDER — ACETAMINOPHEN 650 MG RE SUPP: 650.0000 mg | Freq: Four times a day (QID) | RECTAL | Status: DC | PRN

## 2021-06-22 MED ORDER — VALSARTAN-HYDROCHLOROTHIAZIDE 320-25 MG PO TABS: 1.0000 | ORAL_TABLET | Freq: Every day | ORAL | Status: DC

## 2021-06-22 MED ORDER — IRBESARTAN 300 MG PO TABS: 300.0000 mg | ORAL_TABLET | Freq: Every day | ORAL | Status: DC

## 2021-06-22 MED ORDER — METHYLPREDNISOLONE SODIUM SUCC 125 MG IJ SOLR
125.0000 mg | Freq: Once | INTRAMUSCULAR | Status: AC
  Administered 2021-06-22: 125 mg via INTRAVENOUS
  Filled 2021-06-22: qty 2

## 2021-06-22 MED ORDER — ACETAMINOPHEN 325 MG PO TABS: 650.0000 mg | ORAL_TABLET | Freq: Four times a day (QID) | ORAL | Status: DC | PRN

## 2021-06-22 MED ORDER — SODIUM CHLORIDE 0.9 % IV SOLN
500.0000 mg | Freq: Once | INTRAVENOUS | Status: AC
  Administered 2021-06-22: 500 mg via INTRAVENOUS
  Filled 2021-06-22: qty 5

## 2021-06-22 MED ORDER — SODIUM CHLORIDE 0.9 % IV SOLN
2.0000 g | INTRAVENOUS | Status: DC
  Administered 2021-06-23 – 2021-06-24 (×2): 2 g via INTRAVENOUS
  Filled 2021-06-22 (×2): qty 20

## 2021-06-22 NOTE — ED Triage Notes (Signed)
Pt spo2 84% on RA. Started 2LNC spo2 inc to 22. Increased  to 3L now spo2 96%

## 2021-06-22 NOTE — H&P (Signed)
History and Physical   [name] IOE:[id number] DOB: April 27, 1941 DOA: 06/22/2021  PCP: [name], MD   Patient coming from: Home  Chief Complaint: Shortness of breath  HPI: [name] is a 81 y.o. male with medical history significant of pulmonary fibrosis, rheumatoid arthritis, diverticulosis, hemorrhoids, GERD, anemia, OSA, pneumothorax presenting with worsening shortness of breath.  Patient reports shortness of breath in the past week.  With some associated hoarseness and dry cough.  Family reported noted some shallow breathing.  Does have a known history of ILD.  Evaluated at urgent care initially and sent to the ED for further evaluation with hypoxia on exertion. Denies exacerbation of ILD / Fibrosis in the past.  Denies fevers, chills, chest pain, abdominal pain, constipation, diarrhea, nausea, vomiting.  ED Course: Vital signs in the ED significant for pulse in the 80s to 100s, blood pressure in the 182X to 937J systolic.  Requiring 2 to 3 L to maintain saturations particular on exertion.  Desaturated into the low 80s on exertion both at urgent care and here.  Lab work-up showed BMP with BUN 26 creatinine 1.7 which appears to be stable from 1.8 a year ago but there is no other recent values, glucose 120, calcium 9.2.  CBC within normal limits.  Troponin negative.  BNP normal.  Rest were panel including COVID-negative.  Chest x-ray showing chronic pulmonary fibrosis with increased focal opacity at left midlung possibly consistent with pneumonia.  Patient received ceftriaxone and azithromycin in the ED.  Review of Systems: As per HPI otherwise all other systems reviewed and are negative.  Past Medical History:  Diagnosis Date   Adenomatous colon polyp    Collapsed lung 04/2018   Diverticulosis    GERD (gastroesophageal reflux disease)    Hemorrhoids    internal and external   Hiatal hernia    Hypertension    Pneumonia 11/2019   Rheumatoid arthritis ([place])    S/P  dilatation of esophageal stricture     Past Surgical History:  Procedure Laterality Date   CIRCUMCISION     HEMORRHOID SURGERY  2002   PLEURADESIS Right 06/14/2019   Procedure: PLEURADESIS USING TALC;  Surgeon: [name], MD;  Location: [place];  Service: Thoracic;  Laterality: Right;   STAPLING OF BLEBS Right 06/14/2019   Procedure: STAPLING OF BLEBS;  Surgeon: [name], MD;  Location: [facility] OR;  Service: Thoracic;  Laterality: Right;   VIDEO ASSISTED THORACOSCOPY Right 06/14/2019   Procedure: VIDEO ASSISTED THORACOSCOPY WITH RIGHT UPPER LOBE WEDGE;  Surgeon: [name], MD;  Location: [place];  Service: Thoracic;  Laterality: Right;    Social History  reports that he has never smoked. He has never used smokeless tobacco. He reports that he does not drink alcohol and does not use drugs.  No Known Allergies  Family History  Problem Relation Age of Onset   Prostate cancer Paternal Uncle    Colon cancer Neg Hx    Throat cancer Neg Hx    Diabetes Neg Hx    Kidney disease Neg Hx    Liver disease Neg Hx   Reviewed on admission  Prior to Admission medications   Medication Sig Start Date End Date Taking? Authorizing Provider  acetaminophen (TYLENOL) 500 MG tablet Take 2 tablets (1,000 mg total) by mouth every 6 (six) hours. Patient taking differently: Take 1,000 mg by mouth every 6 (six) hours as needed. 06/18/19   Elgie Collard, PA-C  amLODipine (NORVASC) 10 MG tablet Take 1  tablet (10 mg total) by mouth daily. 11/08/15   Thurnell Lose, MD  aspirin EC 81 MG tablet Take 81 mg by mouth daily.    [provider]  cephALEXin (KEFLEX) 250 MG capsule 1 capsule 11/25/18   [provider]  cetirizine (ZYRTEC) 10 MG tablet Take 1 tablet (10 mg total) by mouth 2 (two) times daily as needed for allergies (Can take an extra dose during flare ups). Take 1 tablet 1-2 times daily as needed. 03/20/21   Kozlow, Donnamarie Poag, MD  FARXIGA 10 MG TABS tablet 10 mg daily.  07/01/19    [provider]  fluticasone (FLONASE) 50 MCG/ACT nasal spray Place 2 sprays into both nostrils daily. 03/20/21   Kozlow, Donnamarie Poag, MD  FOLIC ACID PO Take 1 tablet by mouth daily.    [provider]  Menthol-Methyl Salicylate (MUSCLE RUB) 10-15 % CREA Apply 1 application topically as needed for muscle pain (shoulder/neck).    [provider]  methotrexate (RHEUMATREX) 2.5 MG tablet Take 10 mg by mouth See admin instructions. Take 10mg  every Thursday and Friday    [provider]  montelukast (SINGULAIR) 10 MG tablet Take 1 tablet (10 mg total) by mouth at bedtime. 03/20/21   Kozlow, Donnamarie Poag, MD  Olopatadine HCl (PATADAY) 0.2 % SOLN Place 1 drop into both eyes daily. Use 1 drop in each eye once daily as needed. 03/20/21   Kozlow, Donnamarie Poag, MD  Omega 3 1200 MG CAPS 1 capsule    [provider]  omeprazole (PRILOSEC) 20 MG capsule TAKE 1 CAPSULE BY MOUTH EVERY DAY 02/16/21   Irene Shipper, MD  valsartan-hydrochlorothiazide (DIOVAN-HCT) 320-25 MG tablet Take 1 tablet by mouth daily. 04/19/19   [provider]    Physical Exam: Vitals:   06/22/21 1930 06/22/21 1945 06/22/21 2000 06/22/21 2015  BP: 132/71 (!) 144/74 113/73 140/82  Pulse: 82 88 84 (!) 101  Resp:    18  Temp:      TempSrc:      SpO2: 97% 92% 97% 92%  Weight:      Height:        Physical Exam Constitutional:      General: He is not in acute distress.    Appearance: Normal appearance.  HENT:     Head: Normocephalic and atraumatic.     Mouth/Throat:     Mouth: Mucous membranes are moist.     Pharynx: Oropharynx is clear.  Eyes:     Extraocular Movements: Extraocular movements intact.     Pupils: Pupils are equal, round, and reactive to light.  Cardiovascular:     Rate and Rhythm: Normal rate and regular rhythm.     Pulses: Normal pulses.     Heart sounds: Normal heart sounds.  Pulmonary:     Effort: Pulmonary effort is normal. No respiratory distress.     Breath sounds:  Rales present.  Abdominal:     General: Bowel sounds are normal. There is no distension.     Palpations: Abdomen is soft.     Tenderness: There is no abdominal tenderness.  Musculoskeletal:        General: No swelling or deformity.  Skin:    General: Skin is warm and dry.  Neurological:     General: No focal deficit present.     Mental Status: Mental status is at baseline.   Labs on Admission: I have personally reviewed following labs and imaging studies  CBC: Recent Labs  Lab  06/22/21 1733  WBC 7.6  NEUTROABS 5.9  HGB 13.7  HCT 40.8  MCV 93.6  PLT 546    Basic Metabolic Panel: Recent Labs  Lab 06/22/21 1733  NA 136  K 4.5  CL 103  CO2 22  GLUCOSE 120*  BUN 26*  CREATININE 1.70*  CALCIUM 9.2    GFR: Estimated Creatinine Clearance: 37.7 mL/min (A) (by C-G formula based on SCr of 1.7 mg/dL (H)).  Liver Function Tests: No results for input(s): AST, ALT, ALKPHOS, BILITOT, PROT, ALBUMIN in the last 168 hours.  Urine analysis:    Component Value Date/Time   COLORURINE YELLOW 11/07/2015 1804   APPEARANCEUR CLOUDY (A) 11/07/2015 1804   LABSPEC 1.018 11/07/2015 1804   PHURINE 5.5 11/07/2015 1804   GLUCOSEU NEGATIVE 11/07/2015 1804   HGBUR NEGATIVE 11/07/2015 1804   BILIRUBINUR NEGATIVE 11/07/2015 1804   KETONESUR NEGATIVE 11/07/2015 1804   PROTEINUR 30 (A) 11/07/2015 1804   NITRITE NEGATIVE 11/07/2015 1804   LEUKOCYTESUR NEGATIVE 11/07/2015 1804    Radiological Exams on Admission: DG Chest Port 1 View  Result Date: 06/22/2021 CLINICAL DATA:  Shortness of breath and hypoxia, worsening over the last few weeks. EXAM: PORTABLE CHEST 1 VIEW COMPARISON:  03/20/2020 FINDINGS: Artifact overlies the chest. Heart size is normal. There is aortic atherosclerotic calcification. There is chronic lung disease with widespread pulmonary scarring. Findings consistent with UIP by previous CT. There may be some accentuated density in the left mid lung that could possibly  represent a focal pneumonia. No dense consolidation or lobar collapse. No visible effusion. IMPRESSION: Chronic pulmonary fibrosis/UIP. Increased focal opacity in the left mid lung which could represent superimposed pneumonia. Electronically Signed   By: Nelson Chimes M.D.   On: 06/22/2021 17:14    EKG: Ordered in the ED, but not yet performed.  Assessment/Plan Principal Problem:   Acute respiratory failure with hypoxia (HCC) Active Problems:   GERD   OSA (obstructive sleep apnea)   ILD (interstitial lung disease) (HCC)   Acute respiratory failure with hypoxia ILD, ?Acute exacerbation ?PNA > Patient presenting with shortness of breath for the past week with some associated hoarseness and cough. > History of ILD/pulmonary fibrosis.  Chest x-ray showing changes of chronic pulmonary fibrosis with increased focal opacity at the left midlung. > Findings could be consistent with pneumonia versus exacerbation of pulmonary fibrosis > Has received azithromycin and ceftriaxone in the ED. > No leukocytosis or fever raising suspicion for pulmonary fibrosis exacerbation. > Negative for flu and COVID in the ED.  Will check for full viral panel to rule out viral etiology and check procalcitonin. - Monitor on telemetry - Continue with ceftriaxone azithromycin - Initiate steroids with Solu-Medrol once, daily prednisone - Check procalcitonin and full viral panel - Supplemental oxygen, wean as tolerated - Consider pulmonology consult if worsening despite these interventions  OSA - Continue home CPAP   GERD - Continue home PPI  Rheumatoid arthritis - Continue home methotrexate   DVT prophylaxis: Lovenox Code Status:   Full Family Communication:  Daughter updated at bedside  Disposition Plan:   Patient is from:  Home  Anticipated DC to:  Home  Anticipated DC date:  1 to 3 days  Anticipated DC barriers: None  Consults called:  None Admission status:  Observation, telemetry  Severity of  Illness: The appropriate patient status for this patient is OBSERVATION. Observation status is judged to be reasonable and necessary in order to provide the required intensity of service to ensure the patient's safety.  The patient's presenting symptoms, physical exam findings, and initial radiographic and laboratory data in the context of their medical condition is felt to place them at decreased risk for further clinical deterioration. Furthermore, it is anticipated that the patient will be medically stable for discharge from the hospital within 2 midnights of admission.    Marcelyn Bruins MD [facility]  How to contact the [facility] Attending or Consulting provider [place] or covering provider during after hours [place], for this patient?   Check the care team in [facility] and look for a) attending/consulting [facility] provider listed and b) the [facility] team listed Log into [email or web address] and use [facility] universal password to access. If you do not have the password, please contact the hospital operator. Locate the [facility] provider you are looking for under [facility] and page to a number that you can be directly reached. If you still have difficulty reaching the provider, please page the [facility] (Director on Call) for the Hospitalists listed on amion for assistance.  06/22/2021, 8:43 PM

## 2021-06-22 NOTE — ED Notes (Signed)
Ems at bedside  

## 2021-06-22 NOTE — ED Triage Notes (Signed)
Pt c/o SOB with exertion, intermittent hoarseness, dry cough (started after surgery a few years ago),   Denies sore throat, ear ache,   Onset ~ last week

## 2021-06-22 NOTE — ED Notes (Signed)
Ems contacted  ?

## 2021-06-22 NOTE — ED Triage Notes (Signed)
Exertional SOB x 2 weeks ago that has worsened.  84% on Urgent care placed on 3L now is 95%. Currently on RA 92% with no exertion. UC called EMS

## 2021-06-22 NOTE — Progress Notes (Signed)
Spoke with pt about using CPAP while admitted. Pt currently unsure about wearing CPAP due to him having the [place] on. Explained to pt that we can bleed O2 into machine, but since pt is unsure we will wait until he is admitted to a floor to start CPAP to give pt time to decide if he wants to wear or not. Told pt that once he's admitted to a floor, the RT covering would speak with him.

## 2021-06-22 NOTE — Discharge Instructions (Signed)
Patient sent to the hospital via EMS. 

## 2021-06-22 NOTE — ED Provider Notes (Signed)
[facility] EMERGENCY DEPARTMENT  Provider Note  CSN: [id number] Arrival date & time: 06/22/21 1635  History Chief Complaint  Patient presents with   Shortness of Breath    [name] is a 81 y.o. male with history of interstitial lung disease/pulm fibrosis and prior pneumothorax s/p bleb resection and talc pleurodesis reports about a week of increasing DOE. No significant orthopnea. Mild cough, similar to previous. No fever, N/V/D or chest pains. He went to UC where he was noted to be hypoxic and dyspneic after walking in the door, placed on supplemental oxygen with improvement and sent to the ED via EMS on Room air without any further hypoxia.    Home Medications Prior to Admission medications   Medication Sig Start Date End Date Taking? Authorizing Provider  acetaminophen (TYLENOL) 500 MG tablet Take 2 tablets (1,000 mg total) by mouth every 6 (six) hours. Patient taking differently: Take 1,000 mg by mouth every 6 (six) hours as needed. 06/18/19  Yes Conte, Tessa N, PA-C  amLODipine (NORVASC) 10 MG tablet Take 1 tablet (10 mg total) by mouth daily. 11/08/15  Yes Thurnell Lose, MD  aspirin EC 81 MG tablet Take 81 mg by mouth daily.   Yes [provider]  atorvastatin (LIPITOR) 10 MG tablet Take 10 mg by mouth daily. 06/09/21  Yes [provider]  cetirizine (ZYRTEC) 10 MG tablet Take 1 tablet (10 mg total) by mouth 2 (two) times daily as needed for allergies (Can take an extra dose during flare ups). Take 1 tablet 1-2 times daily as needed. 03/20/21  Yes Kozlow, Donnamarie Poag, MD  FARXIGA 10 MG TABS tablet Take 10 mg by mouth daily. 07/01/19  Yes [provider]  fluticasone (FLONASE) 50 MCG/ACT nasal spray Place 2 sprays into both nostrils daily. 03/20/21  Yes Kozlow, Donnamarie Poag, MD  FOLIC ACID PO Take 1 tablet by mouth daily.   Yes [provider]  Menthol-Methyl Salicylate (MUSCLE RUB) 10-15 % CREA Apply 1 application topically as needed  for muscle pain (shoulder/neck).   Yes [provider]  methotrexate (RHEUMATREX) 2.5 MG tablet Take 10 mg by mouth See admin instructions. Take 10mg  every Thursday and Friday   Yes [provider]  montelukast (SINGULAIR) 10 MG tablet Take 1 tablet (10 mg total) by mouth at bedtime. 03/20/21  Yes Kozlow, Donnamarie Poag, MD  Olopatadine HCl (PATADAY) 0.2 % SOLN Place 1 drop into both eyes daily. Use 1 drop in each eye once daily as needed. 03/20/21  Yes Kozlow, Donnamarie Poag, MD  Omega 3 1200 MG CAPS Take 1,200 mg by mouth daily.   Yes [provider]  omeprazole (PRILOSEC) 20 MG capsule TAKE 1 CAPSULE BY MOUTH EVERY DAY Patient taking differently: Take 20 mg by mouth daily. 02/16/21  Yes Irene Shipper, MD  valsartan-hydrochlorothiazide (DIOVAN-HCT) 320-25 MG tablet Take 1 tablet by mouth daily. 04/19/19  Yes [provider]  cephALEXin (KEFLEX) 250 MG capsule 1 capsule Patient not taking: Reported on 06/22/2021 11/25/18   [provider]     Allergies    Patient has no known allergies.   Review of Systems   Review of Systems Please see HPI for pertinent positives and negatives  Physical Exam BP 140/82    Pulse (!) 101    Temp 98.1 F (36.7 C) (Oral)    Resp 18    Ht 5\' 7"  (1.702 m)    Wt 96.7 kg    SpO2 92%  BMI 33.39 kg/m   Physical Exam Vitals and nursing note reviewed.  Constitutional:      Appearance: Normal appearance.  HENT:     Head: Normocephalic and atraumatic.     Nose: Nose normal.     Mouth/Throat:     Mouth: Mucous membranes are moist.  Eyes:     Extraocular Movements: Extraocular movements intact.     Conjunctiva/sclera: Conjunctivae normal.  Cardiovascular:     Rate and Rhythm: Normal rate.  Pulmonary:     Effort: Pulmonary effort is normal.     Breath sounds: Normal breath sounds. No wheezing, rhonchi or rales.  Abdominal:     General: Abdomen is flat.     Palpations: Abdomen is soft.     Tenderness: There is no abdominal  tenderness.  Musculoskeletal:        General: No swelling. Normal range of motion.     Cervical back: Neck supple.     Right lower leg: No edema.     Left lower leg: No edema.  Skin:    General: Skin is warm and dry.  Neurological:     General: No focal deficit present.     Mental Status: He is alert.  Psychiatric:        Mood and Affect: Mood normal.    ED Results / Procedures / Treatments   EKG EKG Interpretation  Date/Time:  Friday June 22 2021 22:05:10 EST Ventricular Rate:  89 PR Interval:  189 QRS Duration: 84 QT Interval:  347 QTC Calculation: 423 R Axis:   87 Text Interpretation: Sinus rhythm Borderline right axis deviation Anteroseptal infarct, old No significant change since last tracing Confirmed by Calvert Cantor [phone]) on 06/22/2021 10:13:00 PM  Procedures .Critical Care Performed by: Truddie Hidden, MD Authorized by: Truddie Hidden, MD   Critical care provider statement:    Critical care time (minutes):  45   Critical care time was exclusive of:  Separately billable procedures and treating other patients   Critical care was necessary to treat or prevent imminent or life-threatening deterioration of the following conditions:  Respiratory failure   Critical care was time spent personally by me on the following activities:  Development of treatment plan with patient or surrogate, discussions with consultants, evaluation of patient's response to treatment, examination of patient, ordering and review of laboratory studies, ordering and review of radiographic studies, ordering and performing treatments and interventions, pulse oximetry, re-evaluation of patient's condition and review of old charts   Care discussed with: admitting provider    Medications Ordered in the ED Medications  azithromycin (ZITHROMAX) 500 mg in sodium chloride 0.9 % 250 mL IVPB (500 mg Intravenous New Bag/Given 06/22/21 2211)  amLODipine (NORVASC) tablet 10 mg (has no  administration in time range)  pantoprazole (PROTONIX) EC tablet 40 mg (has no administration in time range)  enoxaparin (LOVENOX) injection 40 mg (40 mg Subcutaneous Given 06/22/21 2213)  sodium chloride flush (NS) 0.9 % injection 3 mL (3 mLs Intravenous Given 06/22/21 2212)  acetaminophen (TYLENOL) tablet 650 mg (has no administration in time range)    Or  acetaminophen (TYLENOL) suppository 650 mg (has no administration in time range)  predniSONE (DELTASONE) tablet 40 mg (has no administration in time range)  montelukast (SINGULAIR) tablet 10 mg (has no administration in time range)  valsartan-hydrochlorothiazide (DIOVAN-HCT) 320-25 MG per tablet 1 tablet (has no administration in time range)  methotrexate (RHEUMATREX) tablet 10 mg (has no administration in time range)  cefTRIAXone (ROCEPHIN) 1  g in sodium chloride 0.9 % 100 mL IVPB (has no administration in time range)  azithromycin (ZITHROMAX) 250 mg in dextrose 5 % 125 mL IVPB (has no administration in time range)  cefTRIAXone (ROCEPHIN) 2 g in sodium chloride 0.9 % 100 mL IVPB (has no administration in time range)  cefTRIAXone (ROCEPHIN) 1 g in sodium chloride 0.9 % 100 mL IVPB (0 g Intravenous Stopped 06/22/21 2152)  methylPREDNISolone sodium succinate (SOLU-MEDROL) 125 mg/2 mL injection 125 mg (125 mg Intravenous Given 06/22/21 2206)    Initial Impression and Plan  Patient with ILD and increasing SOB had an episode of hypoxia after walking, measured at [facility], now feeling better, not hypoxic on room air at rest. Will check XRay, labs and EKG.   ED Course   Clinical Course as of 06/22/21 2213  Fri Jun 22, 2021  1735 I personally viewed the images from radiology studies and agree with radiologist interpretation: ILD with ? New L infiltrate.   [CS]  0350 CBC is normal. [CS]  0938 BMP with CKD similar to previous.  [CS]  1829 Trop is neg.  [CS]  9371 BNP not significantly elevated.  [CS]  1938 Patient's Covid/Flu is negative. Will check  an ambulatory pulse ox to determine if he needs admission.  [CS]  1952 SpO2 down to 81% when walking a short disance with an increase in HR as well. Will start on supplemental oxygen and begin IV Abx for possible CAP. Admit to hospitalist.  [CS]  2034 Spoke with Dr. Trilby Drummer, Hospitalist, who will evaluate the patient for admission.  [CS]    Clinical Course User Index [CS] Truddie Hidden, MD     MDM Rules/Calculators/A&P Medical Decision Making Problems Addressed: Acute respiratory failure with hypoxia [facility]): acute illness or injury that poses a threat to life or bodily functions Community acquired pneumonia of left lung, unspecified part of lung: acute illness or injury Pulmonary fibrosis ([place]): chronic illness or injury with exacerbation, progression, or side effects of treatment  Amount and/or Complexity of Data Reviewed Labs: ordered. Decision-making details documented in ED Course. Radiology: ordered and independent interpretation performed. Decision-making details documented in ED Course. ECG/medicine tests: ordered and independent interpretation performed. Decision-making details documented in ED Course.  Risk Decision regarding hospitalization.  Critical Care Total time providing critical care: 30-74 minutes   Final Clinical Impression(s) / ED Diagnoses Final diagnoses:  Community acquired pneumonia of left lung, unspecified part of lung  Pulmonary fibrosis ([place])  Acute respiratory failure with hypoxia [facility]    Rx / DC Orders ED Discharge Orders     None        Truddie Hidden, MD 06/22/21 2213

## 2021-06-22 NOTE — ED Provider Notes (Signed)
[facility]    CSN: [id number] Arrival date & time: 06/22/21  1513      History   Chief Complaint Chief Complaint  Patient presents with   Shortness of Breath    HPI [name] is a 81 y.o. male.   Patient presents with shortness of breath, hoarseness, cough that has been present for approximately 1 week.  Wife at bedside reports that she has noticed that he has had some shallow breathing over the past week.  Patient reports nonproductive dry cough but states that this has been present for multiple years and this is baseline given his chronic lung disease.  He does have a history of interstitial lung disease as well as a collapsed lung in the past.  Patient denies that he wears oxygen at home but does report that he wears a sleep apnea machine at night.  Denies nasal congestion, chest pain, sore throat, ear pain, nausea, vomiting, diarrhea, abdominal pain.  Denies any known fevers or sick contacts.   Shortness of Breath  Past Medical History:  Diagnosis Date   Adenomatous colon polyp    Collapsed lung 04/2018   Diverticulosis    GERD (gastroesophageal reflux disease)    Hemorrhoids    internal and external   Hiatal hernia    Hypertension    Pneumonia 11/2019   Rheumatoid arthritis ([place])    S/P dilatation of esophageal stricture     Patient Active Problem List   Diagnosis Date Noted   Seasonal and perennial allergic rhinitis 08/30/2020   Seasonal allergic conjunctivitis 08/30/2020   ILD (interstitial lung disease) ([place]) 08/30/2020   Pneumonia 01/07/2020   OSA (obstructive sleep apnea) 01/07/2020   History of pneumothorax 08/04/2019   Postop check 06/30/2019   Recurrent pneumothorax 06/14/2019   Pneumothorax on right 05/28/2019   ARF (acute renal failure) ([place]) 11/08/2015   AKI (acute kidney injury) ([place]) 11/07/2015   Normocytic anemia 12/01/2014   FLATULENCE-GAS-BLOATING 10/03/2009   ARTHRITIS, RHEUMATOID 06/25/2007   HEMORRHOIDS 12/24/2005    ESOPHAGEAL STRICTURE 12/24/2005   GERD 12/24/2005   HIATAL HERNIA 12/24/2005   DIVERTICULOSIS, COLON 12/24/2005   ESOPHAGITIS, REFLUX 02/18/2001   COLONIC POLYPS, HYPERPLASTIC 02/10/2001    Past Surgical History:  Procedure Laterality Date   CIRCUMCISION     HEMORRHOID SURGERY  2002   PLEURADESIS Right 06/14/2019   Procedure: PLEURADESIS USING TALC;  Surgeon: [name], MD;  Location: [facility];  Service: Thoracic;  Laterality: Right;   STAPLING OF BLEBS Right 06/14/2019   Procedure: STAPLING OF BLEBS;  Surgeon: [name], MD;  Location: [facility] OR;  Service: Thoracic;  Laterality: Right;   VIDEO ASSISTED THORACOSCOPY Right 06/14/2019   Procedure: VIDEO ASSISTED THORACOSCOPY WITH RIGHT UPPER LOBE WEDGE;  Surgeon: [name], MD;  Location: [facility];  Service: Thoracic;  Laterality: Right;       Home Medications    Prior to Admission medications   Medication Sig Start Date End Date Taking? Authorizing Provider  acetaminophen (TYLENOL) 500 MG tablet Take 2 tablets (1,000 mg total) by mouth every 6 (six) hours. Patient taking differently: Take 1,000 mg by mouth every 6 (six) hours as needed. 06/18/19   Elgie Collard, PA-C  amLODipine (NORVASC) 10 MG tablet Take 1 tablet (10 mg total) by mouth daily. 11/08/15   Thurnell Lose, MD  aspirin EC 81 MG tablet Take 81 mg by mouth daily.    [provider]  cephALEXin (KEFLEX) 250 MG capsule 1 capsule 11/25/18  [provider]  cetirizine (ZYRTEC) 10 MG tablet Take 1 tablet (10 mg total) by mouth 2 (two) times daily as needed for allergies (Can take an extra dose during flare ups). Take 1 tablet 1-2 times daily as needed. 03/20/21   Kozlow, Donnamarie Poag, MD  FARXIGA 10 MG TABS tablet 10 mg daily.  07/01/19   [provider]  fluticasone (FLONASE) 50 MCG/ACT nasal spray Place 2 sprays into both nostrils daily. 03/20/21   Kozlow, Donnamarie Poag, MD  FOLIC ACID PO Take 1 tablet by mouth daily.    [provider]   Menthol-Methyl Salicylate (MUSCLE RUB) 10-15 % CREA Apply 1 application topically as needed for muscle pain (shoulder/neck).    [provider]  methotrexate (RHEUMATREX) 2.5 MG tablet Take 10 mg by mouth See admin instructions. Take 10mg  every Thursday and Friday    [provider]  montelukast (SINGULAIR) 10 MG tablet Take 1 tablet (10 mg total) by mouth at bedtime. 03/20/21   Kozlow, Donnamarie Poag, MD  Olopatadine HCl (PATADAY) 0.2 % SOLN Place 1 drop into both eyes daily. Use 1 drop in each eye once daily as needed. 03/20/21   Kozlow, Donnamarie Poag, MD  Omega 3 1200 MG CAPS 1 capsule    [provider]  omeprazole (PRILOSEC) 20 MG capsule TAKE 1 CAPSULE BY MOUTH EVERY DAY 02/16/21   Irene Shipper, MD  valsartan-hydrochlorothiazide (DIOVAN-HCT) 320-25 MG tablet Take 1 tablet by mouth daily. 04/19/19   [provider]    Family History Family History  Problem Relation Age of Onset   Prostate cancer Paternal Uncle    Colon cancer Neg Hx    Throat cancer Neg Hx    Diabetes Neg Hx    Kidney disease Neg Hx    Liver disease Neg Hx     Social History Social History   Tobacco Use   Smoking status: Never   Smokeless tobacco: Never  Vaping Use   Vaping Use: Never used  Substance Use Topics   Alcohol use: No   Drug use: No     Allergies   Patient has no known allergies.   Review of Systems Review of Systems Per HPI  Physical Exam Triage Vital Signs ED Triage Vitals  Enc Vitals Group     BP 06/22/21 1529 (!) 147/64     Pulse Rate 06/22/21 1529 (!) 101     Resp 06/22/21 1529 18     Temp 06/22/21 1529 98.3 F (36.8 C)     Temp Source 06/22/21 1529 Oral     SpO2 06/22/21 1529 (!) 84 %     Weight --      Height --      Head Circumference --      Peak Flow --      Pain Score 06/22/21 1530 0     Pain Loc --      Pain Edu? --      Excl. in [place]? --    No data found.  Updated Vital Signs BP (!) 147/64 (BP Location: Left Arm)    Pulse (!) 101     Temp 98.3 F (36.8 C) (Oral)    Resp 18    SpO2 95%   Visual Acuity Right Eye Distance:   Left Eye Distance:   Bilateral Distance:    Right Eye Near:   Left Eye Near:    Bilateral Near:     Physical Exam Constitutional:      General: He  is not in acute distress.    Appearance: Normal appearance. He is not toxic-appearing or diaphoretic.  HENT:     Head: Normocephalic and atraumatic.  Eyes:     Extraocular Movements: Extraocular movements intact.     Conjunctiva/sclera: Conjunctivae normal.  Cardiovascular:     Pulses: Normal pulses.     Heart sounds: Normal heart sounds.  Pulmonary:     Effort: Pulmonary effort is normal. No respiratory distress.     Breath sounds: Normal breath sounds. No stridor. No wheezing, rhonchi or rales.  Neurological:     General: No focal deficit present.     Mental Status: He is alert and oriented to person, place, and time. Mental status is at baseline.  Psychiatric:        Mood and Affect: Mood normal.        Behavior: Behavior normal.        Thought Content: Thought content normal.        Judgment: Judgment normal.     UC Treatments / Results  Labs (all labs ordered are listed, but only abnormal results are displayed) Labs Reviewed - No data to display  EKG   Radiology No results found.  Procedures Procedures (including critical care time)  Medications Ordered in UC Medications - No data to display  Initial Impression / Assessment and Plan / UC Course  I have reviewed the triage vital signs and the nursing notes.  Pertinent labs & imaging results that were available during my care of the patient were reviewed by me and considered in my medical decision making (see chart for details).     Called to patient room by nurse during triage given the patient's oxygen saturation was 84%.  Patient was placed on 3 L nasal cannula with oxygen recovery to 96%.  Patient appears to be comfortable with respirations after oxygen was applied.   Patient was advised that he will need to go to the hospital for further evaluation and management.  Patient was agreeable with plan.  Patient left via EMS transport. Final Clinical Impressions(s) / UC Diagnoses   Final diagnoses:  Low oxygen saturation  Shortness of breath     Discharge Instructions      Patient sent to the hospital via EMS.    ED Prescriptions   None    PDMP not reviewed this encounter.   Teodora Medici, [place] 06/22/21 [phone]

## 2021-06-23 ENCOUNTER — Other Ambulatory Visit: Payer: Self-pay

## 2021-06-23 ENCOUNTER — Encounter (HOSPITAL_COMMUNITY): Payer: Self-pay | Admitting: Internal Medicine

## 2021-06-23 ENCOUNTER — Observation Stay (HOSPITAL_COMMUNITY): Payer: Medicare Other

## 2021-06-23 DIAGNOSIS — Z79899 Other long term (current) drug therapy: Secondary | ICD-10-CM | POA: Diagnosis not present

## 2021-06-23 DIAGNOSIS — M069 Rheumatoid arthritis, unspecified: Secondary | ICD-10-CM | POA: Diagnosis present

## 2021-06-23 DIAGNOSIS — Z20822 Contact with and (suspected) exposure to covid-19: Secondary | ICD-10-CM | POA: Diagnosis present

## 2021-06-23 DIAGNOSIS — I129 Hypertensive chronic kidney disease with stage 1 through stage 4 chronic kidney disease, or unspecified chronic kidney disease: Secondary | ICD-10-CM | POA: Diagnosis present

## 2021-06-23 DIAGNOSIS — D649 Anemia, unspecified: Secondary | ICD-10-CM | POA: Diagnosis present

## 2021-06-23 DIAGNOSIS — I10 Essential (primary) hypertension: Secondary | ICD-10-CM | POA: Diagnosis not present

## 2021-06-23 DIAGNOSIS — J302 Other seasonal allergic rhinitis: Secondary | ICD-10-CM | POA: Diagnosis present

## 2021-06-23 DIAGNOSIS — N1832 Chronic kidney disease, stage 3b: Secondary | ICD-10-CM

## 2021-06-23 DIAGNOSIS — R0609 Other forms of dyspnea: Secondary | ICD-10-CM | POA: Diagnosis not present

## 2021-06-23 DIAGNOSIS — Z86010 Personal history of colonic polyps: Secondary | ICD-10-CM | POA: Diagnosis not present

## 2021-06-23 DIAGNOSIS — K219 Gastro-esophageal reflux disease without esophagitis: Secondary | ICD-10-CM | POA: Diagnosis present

## 2021-06-23 DIAGNOSIS — R7982 Elevated C-reactive protein (CRP): Secondary | ICD-10-CM | POA: Diagnosis present

## 2021-06-23 DIAGNOSIS — J189 Pneumonia, unspecified organism: Secondary | ICD-10-CM | POA: Diagnosis present

## 2021-06-23 DIAGNOSIS — K449 Diaphragmatic hernia without obstruction or gangrene: Secondary | ICD-10-CM | POA: Diagnosis present

## 2021-06-23 DIAGNOSIS — J84112 Idiopathic pulmonary fibrosis: Secondary | ICD-10-CM | POA: Diagnosis present

## 2021-06-23 DIAGNOSIS — R0602 Shortness of breath: Secondary | ICD-10-CM | POA: Diagnosis present

## 2021-06-23 DIAGNOSIS — G4733 Obstructive sleep apnea (adult) (pediatric): Secondary | ICD-10-CM | POA: Diagnosis present

## 2021-06-23 DIAGNOSIS — Z7982 Long term (current) use of aspirin: Secondary | ICD-10-CM | POA: Diagnosis not present

## 2021-06-23 DIAGNOSIS — K5790 Diverticulosis of intestine, part unspecified, without perforation or abscess without bleeding: Secondary | ICD-10-CM | POA: Diagnosis present

## 2021-06-23 DIAGNOSIS — K222 Esophageal obstruction: Secondary | ICD-10-CM | POA: Diagnosis present

## 2021-06-23 DIAGNOSIS — Z8042 Family history of malignant neoplasm of prostate: Secondary | ICD-10-CM | POA: Diagnosis not present

## 2021-06-23 DIAGNOSIS — J9601 Acute respiratory failure with hypoxia: Secondary | ICD-10-CM | POA: Diagnosis not present

## 2021-06-23 LAB — ECHOCARDIOGRAM COMPLETE
AR max vel: 2.78 cm2
AV Area VTI: 2.68 cm2
AV Area mean vel: 2.75 cm2
AV Mean grad: 6 mmHg
AV Peak grad: 10.6 mmHg
Ao pk vel: 1.63 m/s
Area-P 1/2: 3.08 cm2
Height: 68 in
MV VTI: 2.96 cm2
S' Lateral: 2.4 cm
Weight: 3171.1 oz

## 2021-06-23 LAB — CBC
HCT: 37.7 % — ABNORMAL LOW (ref 39.0–52.0)
Hemoglobin: 12.9 g/dL — ABNORMAL LOW (ref 13.0–17.0)
MCH: 31.9 pg (ref 26.0–34.0)
MCHC: 34.2 g/dL (ref 30.0–36.0)
MCV: 93.3 fL (ref 80.0–100.0)
Platelets: 186 10*3/uL (ref 150–400)
RBC: 4.04 MIL/uL — ABNORMAL LOW (ref 4.22–5.81)
RDW: 15.3 % (ref 11.5–15.5)
WBC: 5.7 10*3/uL (ref 4.0–10.5)
nRBC: 0 % (ref 0.0–0.2)

## 2021-06-23 LAB — COMPREHENSIVE METABOLIC PANEL
ALT: 19 U/L (ref 0–44)
AST: 31 U/L (ref 15–41)
Albumin: 3.2 g/dL — ABNORMAL LOW (ref 3.5–5.0)
Alkaline Phosphatase: 43 U/L (ref 38–126)
Anion gap: 13 (ref 5–15)
BUN: 28 mg/dL — ABNORMAL HIGH (ref 8–23)
CO2: 17 mmol/L — ABNORMAL LOW (ref 22–32)
Calcium: 8.9 mg/dL (ref 8.9–10.3)
Chloride: 105 mmol/L (ref 98–111)
Creatinine, Ser: 1.64 mg/dL — ABNORMAL HIGH (ref 0.61–1.24)
GFR, Estimated: 42 mL/min — ABNORMAL LOW (ref >60)
Glucose, Bld: 161 mg/dL — ABNORMAL HIGH (ref 70–99)
Potassium: 4.8 mmol/L (ref 3.5–5.1)
Sodium: 135 mmol/L (ref 135–145)
Total Bilirubin: 0.7 mg/dL (ref 0.3–1.2)
Total Protein: 6.7 g/dL (ref 6.5–8.1)

## 2021-06-23 LAB — RESPIRATORY PANEL BY PCR

## 2021-06-23 LAB — PROCALCITONIN: Procalcitonin: <0.1 ng/mL

## 2021-06-23 LAB — SEDIMENTATION RATE: Sed Rate: 103 mm/hr — ABNORMAL HIGH (ref 0–16)

## 2021-06-23 LAB — C-REACTIVE PROTEIN: CRP: 15.3 mg/dL — ABNORMAL HIGH (ref <1.0)

## 2021-06-23 MED ORDER — GUAIFENESIN ER 600 MG PO TB12
600.0000 mg | ORAL_TABLET | Freq: Two times a day (BID) | ORAL | Status: DC
  Administered 2021-06-23 – 2021-06-25 (×5): 600 mg via ORAL
  Filled 2021-06-23 (×5): qty 1

## 2021-06-23 MED ORDER — IPRATROPIUM-ALBUTEROL 0.5-2.5 (3) MG/3ML IN SOLN
3.0000 mL | Freq: Four times a day (QID) | RESPIRATORY_TRACT | Status: DC
  Administered 2021-06-23 – 2021-06-24 (×6): 3 mL via RESPIRATORY_TRACT
  Filled 2021-06-23 (×6): qty 3

## 2021-06-23 MED ORDER — PERFLUTREN LIPID MICROSPHERE
1.0000 mL | INTRAVENOUS | Status: AC | PRN
  Administered 2021-06-23: 2 mL via INTRAVENOUS
  Filled 2021-06-23: qty 10

## 2021-06-23 MED ORDER — SODIUM BICARBONATE 650 MG PO TABS
650.0000 mg | ORAL_TABLET | Freq: Two times a day (BID) | ORAL | Status: DC
  Administered 2021-06-23 – 2021-06-25 (×5): 650 mg via ORAL
  Filled 2021-06-23 (×5): qty 1

## 2021-06-23 NOTE — Assessment & Plan Note (Signed)
Continue with CPAP

## 2021-06-23 NOTE — Assessment & Plan Note (Addendum)
Presented with shortness of breath on exertion, hypoxemia on exertion. ESR and CRP significantly elevated. Continue with prednisone.  Appreciate pulmonologist evaluation, needs prednisone taper: Start prednisone taper by 10 mg every 3 days starting 12/27 Discharge on Oxygen.

## 2021-06-23 NOTE — Assessment & Plan Note (Addendum)
Last Cr in our records at 1.8 a year ago.  Presents with CR 1.7--1.6 Hold Arb and HCT.  Monitor renal function.  Cr down to 1.4

## 2021-06-23 NOTE — Progress Notes (Signed)
SATURATION QUALIFICATIONS: (This note is used to comply with regulatory documentation for home oxygen)  Patient Saturations on Room Air at Rest = 83%  Patient Saturations on 2L while Ambulating = 78%  Patient Saturations on 4 Liters of oxygen while Ambulating = 98%  Please briefly explain why patient needs home oxygen:Pt with desaturation with all activity requiring supplemental oxygen at 2L at rest and 4L with gait to maintain >90%  [name] P, PT Acute Rehabilitation Services Pager: [phone] Office: [phone]

## 2021-06-23 NOTE — Evaluation (Signed)
Physical Therapy Evaluation/ Discharge Patient Details Name: [name] MRN: [id number] DOB: [birth year] Today's Date: 06/23/2021  History of Present Illness  81 yo admitted 2/24 with SOB. PMhx: pulmonary fibrosis, RA, diverticulosis, GERD, OSA, PTX  Clinical Impression  Pt very pleasant and reports being very active at home, helping wife after TKA and even detailing cars occasionally. Pt with good strength, balance, transfers and mobility with noted dependence on supplemental oxygen at this time. Pt on arrival 96% on 2L with transition to RA pt able to maintain 94%. However with transition to sitting pt with immediate drop in sats to 83% and returned to 2L. Pt able to walk 300' on 2L with good speed prior to drastic and quick drop to 78% requiring seated rest and 4L to recover. Pt educated for monitoring oxygen saturation at home with need for home pulse ox and oxygen. No acute physical therapy needs with continued pulse ox monitoring with staff recommended.      Recommendations for follow up therapy are one component of a multi-disciplinary discharge planning process, led by the attending physician.  Recommendations may be updated based on patient status, additional functional criteria and insurance authorization.  Follow Up Recommendations No PT follow up, may benefit from pulmonary rehab    Assistance Recommended at Discharge None  Patient can return home with the following       Equipment Recommendations None recommended by PT  Recommendations for Other Services       Functional Status Assessment Patient has not had a recent decline in their functional status     Precautions / Restrictions Precautions Precautions: Other (comment) Precaution Comments: watch sats      Mobility  Bed Mobility Overal bed mobility: Independent                  Transfers Overall transfer level: Independent                      Ambulation/Gait Ambulation/Gait assistance:  Independent Gait Distance (Feet): 375 Feet Assistive device: None Gait Pattern/deviations: WFL(Within Functional Limits)   Gait velocity interpretation: >4.37 ft/sec, indicative of normal walking speed   General Gait Details: pt with good gait speed and stability with gait. Pt initially with sats >92% on 2L with gait but after 300' pt with rapid drop to 78% requiring 4L and seated rest for 90 seconds to recover to >90%. Return walk to room on 4L pt able to maintain 98% with decreased gait speed  Stairs            Wheelchair Mobility    Modified Rankin (Stroke Patients Only)       Balance Overall balance assessment: No apparent balance deficits (not formally assessed)                                           Pertinent Vitals/Pain Pain Assessment Pain Assessment: No/denies pain    Home Living Family/patient expects to be discharged to:: Private residence Living Arrangements: Spouse/significant other Available Help at Discharge: Family Type of Home: House Home Access: Stairs to enter   Technical brewer of Steps: 1   Home Layout: One level Home Equipment: None      Prior Function Prior Level of Function : Independent/Modified Independent                     Hand Dominance  Extremity/Trunk Assessment   Upper Extremity Assessment Upper Extremity Assessment: Overall WFL for tasks assessed    Lower Extremity Assessment Lower Extremity Assessment: Overall WFL for tasks assessed    Cervical / Trunk Assessment Cervical / Trunk Assessment: Normal  Communication   Communication: No difficulties  Cognition Arousal/Alertness: Awake/alert Behavior During Therapy: WFL for tasks assessed/performed Overall Cognitive Status: Within Functional Limits for tasks assessed                                          General Comments      Exercises     Assessment/Plan    PT Assessment Patient does not need any  further PT services  PT Problem List         PT Treatment Interventions      PT Goals (Current goals can be found in the Care Plan section)  Acute Rehab PT Goals PT Goal Formulation: All assessment and education complete, DC therapy    Frequency       Co-evaluation               AM-PAC PT "6 Clicks" Mobility  Outcome Measure Help needed turning from your back to your side while in a flat bed without using bedrails?: None Help needed moving from lying on your back to sitting on the side of a flat bed without using bedrails?: None Help needed moving to and from a bed to a chair (including a wheelchair)?: None Help needed standing up from a chair using your arms (e.g., wheelchair or bedside chair)?: None Help needed to walk in hospital room?: None Help needed climbing 3-5 steps with a railing? : None 6 Click Score: 24    End of Session   Activity Tolerance: Patient tolerated treatment well Patient left: in chair;with call bell/phone within reach Nurse Communication: Mobility status PT Visit Diagnosis: Other abnormalities of gait and mobility (R26.89)    Time: 3903-0092 PT Time Calculation (min) (ACUTE ONLY): 18 min   Charges:   PT Evaluation $PT Eval Low Complexity: 1 Low          Payam Gribble P, PT Acute Rehabilitation Services Pager: [phone] Office: [phone]   Alechia Lezama B Bastien Strawser 06/23/2021, 1:40 PM

## 2021-06-23 NOTE — Plan of Care (Signed)

## 2021-06-23 NOTE — Hospital Course (Addendum)
81 year old PMH pulmonary fibrosis, rheumatoid arthritis, diverticulosis, hemorrhoids, GERD, OSA, pneumothorax presented with worsening shortness of breath.  He report worsening shortness of breath on exertion especially.  He denies chest pain, reports some mild cough.  Evaluation on admission, chest x-ray showed chronic pulmonary fibrosis with increased focal opacity at left midlung possible consistent with pneumonia.  COVID negative, troponin negative, BNP normal.  Patient admitted for possible ILD flare and pneumonia. Treated for PNA and ILD flare. Improved. Discharge home on oxygen.

## 2021-06-23 NOTE — Assessment & Plan Note (Signed)
-   Continue with PPI 

## 2021-06-23 NOTE — Progress Notes (Signed)
Patient declined CPAP. Unit not in room at this time.

## 2021-06-23 NOTE — Assessment & Plan Note (Addendum)
Present with shortness of breath, cough.  Chest x-ray with increased infiltrates consistent with pneumonia. Treated with  IV ceftriaxone and azithromycin Discharge on Azithro 2 more days and Cefdinir 4 days.

## 2021-06-23 NOTE — TOC Progression Note (Signed)
Transition of Care [facility]) - Progression Note    Patient Details  Name: Colonel Krauser MRN: [id number] Date of Birth: [birth year]  Transition [facility] Care [facility]) CM/SW Contact  Zenon Mayo, RN Phone Number: 06/23/2021, 1:28 PM  Clinical Narrative:     Transition of Care [facility]) Screening Note   Patient Details  Name: Leviticus Harton Date of Birth: May 10, 1940   Transition of Care [facility]) CM/SW Contact:    Zenon Mayo, RN Phone Number: 06/23/2021, 1:28 PM    Transition of Care [facility]) has reviewed patient and no TOC needs have been identified at this time. We will continue to monitor patient advancement through interdisciplinary progression rounds. If new patient transition needs arise, please place a TOC consult.          Expected Discharge Plan and Services                                                 Social Determinants of Health (SDOH) Interventions    Readmission Risk Interventions No flowsheet data found.

## 2021-06-23 NOTE — Assessment & Plan Note (Addendum)
Secondary to pneumonia and or interstitial lung disease flare. He desats on ambulation.  Requiring  4 L of oxygen on exertion. Treated with  oral prednisone and IV antibiotics. Needs 7 days of antibiotics.  Continue with nebulizer. Flutter valve. ECHO normal EF, no significant diastolic dysfunction.  Pulmonary consulted.  Stable.

## 2021-06-23 NOTE — Progress Notes (Signed)
Progress Note   Patient: [name] VQQ:[id number] DOB: Mar 28, 1941 DOA: 06/22/2021     0 DOS: the patient was seen and examined on 06/23/2021   Brief hospital course: 81 year old PMH pulmonary fibrosis, rheumatoid arthritis, diverticulosis, hemorrhoids, GERD, OSA, pneumothorax presented with worsening shortness of breath.  He report worsening shortness of breath on exertion especially.  He denies chest pain, reports some mild cough.  Evaluation on admission, chest x-ray showed chronic pulmonary fibrosis with increased focal opacity at left midlung possible consistent with pneumonia.  COVID negative, troponin negative, BNP normal.  Patient admitted for possible ILD flare and pneumonia.    Assessment and Plan: * Acute respiratory failure with hypoxia ([place])- (present on admission) Secondary to pneumonia and or interstitial lung disease flare. He desats on ambulation.  Quiring 4 L of oxygen on exertion. Continue with oral prednisone and IV antibiotics. Add nebulizer. Flutter valve. Check ECHO.   CKD stage G3b/A1, GFR 30-44 and albumin creatinine ratio <30 mg/g (HCC) Last Cr in our records at 1.8 a year ago.  Presents with CR 1.7--1.6 Hold Arb and HCT.  Monitor renal function.   ILD (interstitial lung disease) ([place])- (present on admission) Presented with shortness of breath on exertion, hypoxemia on exertion. ESR and CRP significantly elevated. Continue with prednisone.  OSA (obstructive sleep apnea)- (present on admission) Continue with CPAP  PNA (pneumonia) Present with shortness of breath, cough.  Chest x-ray with increased infiltrates consistent with pneumonia. Continue with IV ceftriaxone and azithromycin   GERD- (present on admission) Continue with PPI.         Subjective: he is feeling better. Dyspnea specially on exertion.   Physical Exam: Vitals:   06/23/21 0820 06/23/21 1120 06/23/21 1337 06/23/21 1351  BP:  119/66  129/68  Pulse: 85 81 (!) 113 88   Resp: _0 Temp:  97.7 F (36.5 C)  (!) 97.5 F (36.4 C)  TempSrc:  Oral  Oral  SpO2: 96% 93% (!) 80% 94%  Weight:      Height:       General ; NAD CVS; S 1, S 2 RRR Lungs; BL crackles.   Data Reviewed:  ESR, CRP., Bmet   Family Communication: Care discussed with patient.   Disposition: Status is: Observation The patient remains OBS appropriate and will d/c before 2 midnights.        Planned Discharge Destination: Home     Time spent: 45 minutes  Author: Elmarie Shiley, MD 06/23/2021 2:56 PM  For on call review [email or web address].

## 2021-06-23 NOTE — Plan of Care (Signed)
  Problem: Education: Goal: Knowledge of General Education information will improve Description: Including pain rating scale, medication(s)/side effects and non-pharmacologic comfort measures Outcome: Progressing   Problem: Clinical Measurements: Goal: Ability to maintain clinical measurements within normal limits will improve Outcome: Progressing   

## 2021-06-24 ENCOUNTER — Telehealth: Payer: Self-pay | Admitting: Acute Care

## 2021-06-24 DIAGNOSIS — J9601 Acute respiratory failure with hypoxia: Secondary | ICD-10-CM

## 2021-06-24 LAB — BASIC METABOLIC PANEL
Anion gap: 11 (ref 5–15)
BUN: 29 mg/dL — ABNORMAL HIGH (ref 8–23)
CO2: 22 mmol/L (ref 22–32)
Calcium: 9.1 mg/dL (ref 8.9–10.3)
Chloride: 104 mmol/L (ref 98–111)
Creatinine, Ser: 1.44 mg/dL — ABNORMAL HIGH (ref 0.61–1.24)
GFR, Estimated: 49 mL/min — ABNORMAL LOW (ref >60)
Glucose, Bld: 118 mg/dL — ABNORMAL HIGH (ref 70–99)
Potassium: 4.8 mmol/L (ref 3.5–5.1)
Sodium: 137 mmol/L (ref 135–145)

## 2021-06-24 NOTE — Progress Notes (Signed)
Patient refused CPAP for HS.

## 2021-06-24 NOTE — Plan of Care (Signed)

## 2021-06-24 NOTE — Progress Notes (Signed)
Chemo Oral Checklist (Methotrexate)  When this medication is ordered by a non-oncologist: 1. A pharmacist will review the inpatient medical record for: a. Abnormal Lab Values: ? CBC ? CrCl ? LFTs b. Fever or other signs of infection -pt with PNA c. Other pertinent data 2. If any of the medication-specific conditions in the checklist below are identified, the medication will be held until physician review occurs as described in the oral chemotherapy policy.  Plan: Methotrexate will be held per oral chemo protocol.  Sherlon Handing, PharmD, BCPS Please see amion for complete clinical pharmacist phone list 06/24/2021 10:43 PM

## 2021-06-24 NOTE — Consult Note (Signed)
NAME:  [name], MRN:  [id number], DOB:  08-24-1940, LOS: 1 ADMISSION DATE:  06/22/2021, CONSULTATION DATE:  2/26 REFERRING MD:  Tyrell Antonio, CHIEF COMPLAINT:  new hypoxia in pt w/ known ILD    History of Present Illness:  This is an 81 year old male patient followed by Dr. 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  Presented to the urgent care on 2/24 with chief complaint of shortness of breath.  Onset occurred approximately 1 week prior to presentation with associated vocal hoarseness and dry nonproductive cough.  He is typically not on oxygen, during emergency room evaluation pulse oximetry recorded in the low low 80s on room air, because of his new hypoxia he was sent to the emergency room for further evaluation. Initial chest x-ray showed: Showed findings consistent with chronic fibrotic changes with increased focal opacity in the left base.  Respiratory viral panel was negative, COVID-negative, CRP elevated at 15.3, sed rate 103.  Started on ceftriaxone, and azithromycin, received prednisone starting on 2/25.  As of 2/26 from a pulmonary standpoint his symptom burden is improved, however given his history pulmonary has been asked to make further recommendations in regards to therapy going forward and to assist with ensuring he receives post hospital follow-up.  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 PNA vs ILD flare. Favoring PNA. Azith and CTX started. CRP and ESR up.  2/25  pred started 2/26 pulm asked to see   Interim History / Subjective:  Feels much better   Objective   Blood pressure 131/68, pulse 97, temperature 97.6 F (36.4 C), temperature source Oral, resp. rate 16, height '5\' 8"'  (1.727 m), weight 90.2 kg, SpO2 96 %.        Intake/Output Summary (Last 24 hours) at 06/24/2021 0906 Last data filed at 06/24/2021 0853 Gross per 24 hour  Intake 1243.51 ml  Output 2175 ml  Net -931.49 ml   Filed Weights   06/22/21 1644 06/23/21 0010 06/24/21 0522  Weight: 96.7 kg 89.9 kg 90.2 kg    Examination: General: resting in bed. No distress  HENT: NCAT no JVD MMM Lungs: clear. Dec bases. No accessory use on 2lpm Cardiovascular: rrr Abdomen: soft not tender  Extremities: no sig edema  Neuro: awake and alert  GU: voids   Resolved Hospital Problem list     Assessment & Plan:  Principal Problem:   Acute respiratory failure with hypoxia (HCC) Active Problems:   GERD   PNA (pneumonia)   OSA (obstructive sleep apnea)   ILD (interstitial lung disease) (HCC)   CKD stage G3b/A1, GFR 30-44 and albumin creatinine ratio <30 mg/g ([place])   Essential hypertension  Pulmonary problem list: Acute hypoxic respiratory failure secondary to community acquired pneumonia (no organism specified), versus ILD flare.  -Clinically better with antibiotics and steroids -Procalcitonin reassuring  Plan Complete 3 total days of azithromycin Continue Rocephin, currently day #3, would complete total of 7 days of antibiotics Continuing current dosing of prednisone, then decrease by 10 mg daily every 3 days starting 2/27 We will place request to our office to  ensure he has posthospital follow-up  Best Practice (right click and "Reselect all SmartList Selections" daily)   Per primary team  Labs   CBC: Recent Labs  Lab 06/22/21 1733 06/23/21 0201  WBC 7.6 5.7  NEUTROABS 5.9  --   HGB 13.7 12.9*  HCT 40.8 37.7*  MCV 93.6 93.3  PLT 195 016    Basic Metabolic  Panel: Recent Labs  Lab 06/22/21 1733 06/23/21 0201  NA 136 135  K 4.5 4.8  CL 103 105  CO2 22 17*  GLUCOSE 120* 161*  BUN 26* 28*  CREATININE 1.70* 1.64*  CALCIUM 9.2 8.9   GFR: Estimated Creatinine Clearance: 38.5 mL/min (A) (by C-G formula based on SCr of 1.64 mg/dL (H)). Recent Labs  Lab 06/22/21 1733 06/23/21 0201  PROCALCITON  --  <0.10  WBC 7.6 5.7    Liver Function Tests: Recent Labs  Lab 06/23/21 0201  AST 31  ALT 19  ALKPHOS 43  BILITOT 0.7  PROT 6.7  ALBUMIN 3.2*   No results for input(s): LIPASE, AMYLASE in the last 168 hours. No results for input(s): AMMONIA in the last 168 hours.  ABG    Component Value Date/Time   PHART 7.397 06/15/2019 0505   PCO2ART 40.2 06/15/2019 0505   PO2ART 84.4 06/15/2019 0505   HCO3 24.3 06/15/2019 0505   TCO2 26 06/14/2019 1232   ACIDBASEDEF 0.0 06/15/2019 0505   O2SAT 96.1 06/15/2019 0505     Coagulation Profile: No results for input(s): INR, PROTIME in the last 168 hours.  Cardiac Enzymes: No results for input(s): CKTOTAL, CKMB, CKMBINDEX, TROPONINI in the last 168 hours.  HbA1C: No results found for: HGBA1C  CBG: No results for input(s): GLUCAP in the last 168 hours.  Review of Systems:   Review of Systems  Constitutional:  Positive for diaphoresis. Negative for chills, fever and malaise/fatigue.  HENT:  Positive for congestion.   Eyes: Negative.   Respiratory:  Positive for cough and shortness of breath. Negative for sputum production.   Cardiovascular: Negative.   Gastrointestinal: Negative.   Genitourinary: Negative.   Musculoskeletal: Negative.   Skin: Negative.  Negative for rash.  Neurological: Negative.   Endo/Heme/Allergies:  Positive for environmental allergies.  Psychiatric/Behavioral: Negative.      Past Medical History:  He,  has a past medical history of Adenomatous colon polyp, Collapsed lung (04/2018), Diverticulosis, GERD (gastroesophageal reflux disease), Hemorrhoids, Hiatal  hernia, Hypertension, Pneumonia (11/2019), Rheumatoid arthritis ([place]), and S/P dilatation of esophageal stricture.   Surgical History:   Past Surgical History:  Procedure Laterality Date   CIRCUMCISION     HEMORRHOID SURGERY  2002   PLEURADESIS Right 06/14/2019   Procedure: PLEURADESIS USING TALC;  Surgeon: [name], MD;  Location: [facility];  Service: Thoracic;  Laterality: Right;   STAPLING OF BLEBS Right 06/14/2019   Procedure: STAPLING OF BLEBS;  Surgeon: [name], MD;  Location: [place];  Service: Thoracic;  Laterality: Right;   VIDEO ASSISTED THORACOSCOPY Right 06/14/2019   Procedure: VIDEO ASSISTED THORACOSCOPY WITH RIGHT UPPER LOBE WEDGE;  Surgeon: [name], MD;  Location: [facility];  Service: Thoracic;  Laterality: Right;     Social History:   reports that he has never smoked. He has never used smokeless tobacco. He reports that he does not drink alcohol and does not use drugs.   Family History:  His family history includes Prostate cancer in his paternal uncle. There is no history of Colon cancer, Throat cancer, Diabetes, Kidney  disease, or Liver disease.   Allergies No Known Allergies   Home Medications  Prior to Admission medications   Medication Sig Start Date End Date Taking? Authorizing Provider  acetaminophen (TYLENOL) 500 MG tablet Take 2 tablets (1,000 mg total) by mouth every 6 (six) hours. Patient taking differently: Take 1,000 mg by mouth every 6 (six) hours as needed. 06/18/19  Yes Conte, Tessa N, PA-C  amLODipine (NORVASC) 10 MG tablet Take 1 tablet (10 mg total) by mouth daily. 11/08/15  Yes Thurnell Lose, MD  aspirin EC 81 MG tablet Take 81 mg by mouth daily.   Yes [provider]  atorvastatin (LIPITOR) 10 MG tablet Take 10 mg by mouth daily. 06/09/21  Yes [provider]  cetirizine (ZYRTEC) 10 MG tablet Take 1 tablet (10 mg total) by mouth 2 (two) times daily as needed for allergies (Can take an extra dose during flare ups).  Take 1 tablet 1-2 times daily as needed. 03/20/21  Yes Kozlow, Donnamarie Poag, MD  FARXIGA 10 MG TABS tablet Take 10 mg by mouth daily. 07/01/19  Yes [provider]  fluticasone (FLONASE) 50 MCG/ACT nasal spray Place 2 sprays into both nostrils daily. 03/20/21  Yes Kozlow, Donnamarie Poag, MD  FOLIC ACID PO Take 1 tablet by mouth daily.   Yes [provider]  Menthol-Methyl Salicylate (MUSCLE RUB) 10-15 % CREA Apply 1 application topically as needed for muscle pain (shoulder/neck).   Yes [provider]  methotrexate (RHEUMATREX) 2.5 MG tablet Take 10 mg by mouth See admin instructions. Take 14m every Thursday and Friday   Yes [provider]  montelukast (SINGULAIR) 10 MG tablet Take 1 tablet (10 mg total) by mouth at bedtime. 03/20/21  Yes Kozlow, EDonnamarie Poag MD  Olopatadine HCl (PATADAY) 0.2 % SOLN Place 1 drop into both eyes daily. Use 1 drop in each eye once daily as needed. 03/20/21  Yes Kozlow, EDonnamarie Poag MD  Omega 3 1200 MG CAPS Take 1,200 mg by mouth daily.   Yes [provider]  omeprazole (PRILOSEC) 20 MG capsule TAKE 1 CAPSULE BY MOUTH EVERY DAY Patient taking differently: Take 20 mg by mouth daily. 02/16/21  Yes PIrene Shipper MD  valsartan-hydrochlorothiazide (DIOVAN-HCT) 320-25 MG tablet Take 1 tablet by mouth daily. 04/19/19  Yes [provider]     Critical care time: NA   PErick ColaceACNP-BC LEnglevalePager # [phone] # [phone] no answer

## 2021-06-24 NOTE — Assessment & Plan Note (Signed)
Continue with Norvasc.  Hold Diovan/HCTZ due to CKD>

## 2021-06-24 NOTE — Progress Notes (Signed)
Progress Note   Patient: [name] WYS:[id number] DOB: 02-Aug-1940 DOA: 06/22/2021     1 DOS: the patient was seen and examined on 06/24/2021   Brief hospital course: 81 year old PMH pulmonary fibrosis, rheumatoid arthritis, diverticulosis, hemorrhoids, GERD, OSA, pneumothorax presented with worsening shortness of breath.  He report worsening shortness of breath on exertion especially.  He denies chest pain, reports some mild cough.  Evaluation on admission, chest x-ray showed chronic pulmonary fibrosis with increased focal opacity at left midlung possible consistent with pneumonia.  COVID negative, troponin negative, BNP normal.  Patient admitted for possible ILD flare and pneumonia.    Assessment and Plan: * Acute respiratory failure with hypoxia ([place])- (present on admission) Secondary to pneumonia and or interstitial lung disease flare. He desats on ambulation.  Requiring  4 L of oxygen on exertion. Continue with oral prednisone and IV antibiotics. Needs 7 days of antibiotics.  Continue with nebulizer. Flutter valve. ECHO normal EF, no significant diastolic dysfunction.  Pulmonary consulted.   Essential hypertension Continue with Norvasc.  Hold Diovan/HCTZ due to CKD>   CKD stage G3b/A1, GFR 30-44 and albumin creatinine ratio <30 mg/g (HCC) Last Cr in our records at 1.8 a year ago.  Presents with CR 1.7--1.6 Hold Arb and HCT.  Monitor renal function.  Cr down to 1.4  ILD (interstitial lung disease) ([place])- (present on admission) Presented with shortness of breath on exertion, hypoxemia on exertion. ESR and CRP significantly elevated. Continue with prednisone.  Appreciate pulmonologist evaluation, needs prednisone taper: Start prednisone taper by 10 mg every 3 days starting 12/27  OSA (obstructive sleep apnea)- (present on admission) Continue with CPAP  PNA (pneumonia) Present with shortness of breath, cough.  Chest x-ray with increased infiltrates consistent with  pneumonia. Continue with IV ceftriaxone and azithromycin   GERD- (present on admission) Continue with PPI.         Subjective: he is feeling better, breathing better.   Physical Exam: Vitals:   06/24/21 0522 06/24/21 0722 06/24/21 0841 06/24/21 1104  BP: 109/63  131/68 115/62  Pulse: 86 82 97 90  Resp: (!) _0 Temp: 97.6 F (36.4 C)   97.9 F (36.6 C)  TempSrc: Oral   Oral  SpO2: 92%  96% 96%  Weight: 90.2 kg     Height:       General; NAD Lug BL air movement.  Abdomen; soft, nt  Data Reviewed:  ESR, CRP  Family Communication: Care  discussed with patient   Disposition: Status is: Inpatient Remains inpatient appropriate because: remain inpatient for IV antibiotics.           Planned Discharge Destination: Home     Time spent: 45 minutes  Author: Elmarie Shiley, MD 06/24/2021 3:45 PM  For on call review [email or web address].

## 2021-06-24 NOTE — Plan of Care (Signed)
  Problem: Education: Goal: Knowledge of General Education information will improve Description: Including pain rating scale, medication(s)/side effects and non-pharmacologic comfort measures Outcome: Progressing   Problem: Clinical Measurements: Goal: Ability to maintain clinical measurements within normal limits will improve Outcome: Progressing   

## 2021-06-25 MED ORDER — AZITHROMYCIN 500 MG PO TABS: 500.0000 mg | ORAL_TABLET | Freq: Every day | ORAL | 0 refills | Status: AC

## 2021-06-25 MED ORDER — ALBUTEROL SULFATE (2.5 MG/3ML) 0.083% IN NEBU: 2.5000 mg | INHALATION_SOLUTION | RESPIRATORY_TRACT | Status: DC | PRN

## 2021-06-25 MED ORDER — GUAIFENESIN ER 600 MG PO TB12
600.0000 mg | ORAL_TABLET | Freq: Two times a day (BID) | ORAL | 0 refills | Status: AC
Start: 2021-06-25 — End: ?

## 2021-06-25 MED ORDER — CEFDINIR 300 MG PO CAPS: 300.0000 mg | ORAL_CAPSULE | Freq: Two times a day (BID) | ORAL | 0 refills | Status: DC

## 2021-06-25 MED ORDER — IPRATROPIUM-ALBUTEROL 0.5-2.5 (3) MG/3ML IN SOLN: 3.0000 mL | Freq: Two times a day (BID) | RESPIRATORY_TRACT | Status: DC

## 2021-06-25 MED ORDER — IPRATROPIUM-ALBUTEROL 0.5-2.5 (3) MG/3ML IN SOLN
3.0000 mL | Freq: Three times a day (TID) | RESPIRATORY_TRACT | Status: DC
  Administered 2021-06-25: 3 mL via RESPIRATORY_TRACT
  Filled 2021-06-25: qty 3

## 2021-06-25 MED ORDER — PREDNISONE 20 MG PO TABS: ORAL_TABLET | ORAL | 0 refills | Status: DC

## 2021-06-25 NOTE — Progress Notes (Signed)
Mobility Specialist Progress Note: ° ° 06/25/21 1220  °Mobility  °Activity Ambulated with assistance in hallway  °Level of Assistance Standby assist, set-up cues, supervision of patient - no hands on  °Assistive Device None  °Distance Ambulated (ft) 375 ft  °Activity Response Tolerated well  °$Mobility charge 1 Mobility  ° °Pt agreeable to mobility session at this time. Home O2 tank found to be left on and empty, RN and CSW notified. Pt required 4LO2 with exertion. Eager for d/c today, pt left sitting in chair with all needs met.  ° °  °Acute Rehab °Phone: [phone] °Office Phone: [phone] ° °

## 2021-06-25 NOTE — Progress Notes (Signed)
Pt's daughter, Leane Platt called at 7:00 pm stating that her mom couldn't get Azithromycin because their [facility] pharmacist said that MD didn't write an order right. The daughter said that the pharmacist tried to contact MD but didn't get a call back.   Notified the night charge nurse and the assistance manager about the incident. Daughter was not available at the time of call so left v.m. about calling back tomorrow morning.

## 2021-06-25 NOTE — Discharge Summary (Signed)
Physician Discharge Summary   Patient: [name] MRN: [id number] DOB: 19-Nov-1940  Admit date:     06/22/2021  Discharge date: 06/25/21  Discharge Physician: Elmarie Shiley   PCP: [name], MD   Recommendations at discharge:   Needs to follow with pulmonologist.   Discharge Diagnoses: Principal Problem:   Acute respiratory failure with hypoxia ([place]) Active Problems:   GERD   PNA (pneumonia)   OSA (obstructive sleep apnea)   ILD (interstitial lung disease) ([place])   CKD stage G3b/A1, GFR 30-44 and albumin creatinine ratio <30 mg/g ([place])   Essential hypertension  Resolved Problems:   * No resolved hospital problems. *   Hospital Course: 81 year old PMH pulmonary fibrosis, rheumatoid arthritis, diverticulosis, hemorrhoids, GERD, OSA, pneumothorax presented with worsening shortness of breath.  He report worsening shortness of breath on exertion especially.  He denies chest pain, reports some mild cough.  Evaluation on admission, chest x-ray showed chronic pulmonary fibrosis with increased focal opacity at left midlung possible consistent with pneumonia.  COVID negative, troponin negative, BNP normal.  Patient admitted for possible ILD flare and pneumonia. Treated for PNA and ILD flare. Improved. Discharge home on oxygen.     Assessment and Plan: * Acute respiratory failure with hypoxia ([place])- (present on admission) Secondary to pneumonia and or interstitial lung disease flare. He desats on ambulation.  Requiring  4 L of oxygen on exertion. Treated with  oral prednisone and IV antibiotics. Needs 7 days of antibiotics.  Continue with nebulizer. Flutter valve. ECHO normal EF, no significant diastolic dysfunction.  Pulmonary consulted.  Stable.   Essential hypertension Continue with Norvasc.  Hold Diovan/HCTZ due to CKD>   CKD stage G3b/A1, GFR 30-44 and albumin creatinine ratio <30 mg/g (H[facility]) Last Cr in our records at 1.8 a year ago.  Presents with CR  1.7--1.6 Hold Arb and HCT.  Monitor renal function.  Cr down to 1.4  ILD (interstitial lung disease) ([place])- (present on admission) Presented with shortness of breath on exertion, hypoxemia on exertion. ESR and CRP significantly elevated. Continue with prednisone.  Appreciate pulmonologist evaluation, needs prednisone taper: Start prednisone taper by 10 mg every 3 days starting 12/27 Discharge on Oxygen.   OSA (obstructive sleep apnea)- (present on admission) Continue with CPAP  PNA (pneumonia) Present with shortness of breath, cough.  Chest x-ray with increased infiltrates consistent with pneumonia. Treated with  IV ceftriaxone and azithromycin Discharge on Azithro 2 more days and Cefdinir 4 days.   GERD- (present on admission) Continue with PPI.            Consultants: Pulmonologist  Procedures performed: None Disposition: Home Diet recommendation:  Discharge Diet Orders (From admission, onward)     Start     Ordered   06/25/21 0000  Diet - low sodium heart healthy        06/25/21 0950           Cardiac diet  DISCHARGE MEDICATION: Allergies as of 06/25/2021   No Known Allergies      Medication List     STOP taking these medications    valsartan-hydrochlorothiazide 320-25 MG tablet Commonly known as: DIOVAN-HCT       TAKE these medications    acetaminophen 500 MG tablet Commonly known as: TYLENOL Take 2 tablets (1,000 mg total) by mouth every 6 (six) hours. What changed:  when to take this reasons to take this   amLODipine 10 MG tablet Commonly known as: NORVASC Take 1 tablet (10 mg total) by mouth  daily.   aspirin EC 81 MG tablet Take 81 mg by mouth daily.   atorvastatin 10 MG tablet Commonly known as: LIPITOR Take 10 mg by mouth daily.   azithromycin 500 MG tablet Commonly known as: Zithromax Take 1 tablet (500 mg total) by mouth daily for 2 days. Take 1 tablet daily for 3 days.   cefdinir 300 MG capsule Commonly known as:  OMNICEF Take 1 capsule (300 mg total) by mouth 2 (two) times daily for 4 days.   cetirizine 10 MG tablet Commonly known as: ZYRTEC Take 1 tablet (10 mg total) by mouth 2 (two) times daily as needed for allergies (Can take an extra dose during flare ups). Take 1 tablet 1-2 times daily as needed.   Farxiga 10 MG Tabs tablet Generic drug: dapagliflozin propanediol Take 10 mg by mouth daily.   fluticasone 50 MCG/ACT nasal spray Commonly known as: Flonase Place 2 sprays into both nostrils daily.   FOLIC ACID PO Take 1 tablet by mouth daily.   guaiFENesin 600 MG 12 hr tablet Commonly known as: MUCINEX Take 1 tablet (600 mg total) by mouth 2 (two) times daily.   methotrexate 2.5 MG tablet Commonly known as: RHEUMATREX Take 10 mg by mouth See admin instructions. Take 5m every Thursday and Friday   montelukast 10 MG tablet Commonly known as: SINGULAIR Take 1 tablet (10 mg total) by mouth at bedtime.   Muscle Rub 10-15 % Crea Apply 1 application topically as needed for muscle pain (shoulder/neck).   Olopatadine HCl 0.2 % Soln Commonly known as: Pataday Place 1 drop into both eyes daily. Use 1 drop in each eye once daily as needed.   Omega 3 1200 MG Caps Take 1,200 mg by mouth daily.   omeprazole 20 MG capsule Commonly known as: PRILOSEC TAKE 1 CAPSULE BY MOUTH EVERY DAY What changed: how much to take   predniSONE 20 MG tablet Commonly known as: DELTASONE Take 40 mg for 3 days then 30 mg for 3 days then 20 mg for 3 days then 10 mg for 3 days then stop.               Durable Medical Equipment  (From admission, onward)           Start     Ordered   06/25/21 0959  For home use only DME oxygen  Once       Comments: Needs 4 L on exertion  Question Answer Comment  Length of Need 12 Months   Mode or (Route) Nasal cannula   Liters per Minute 2   Frequency Continuous (stationary and portable oxygen unit needed)   Oxygen delivery system Gas      06/25/21 0959             Follow-up Information     BLucianne Lei MD. Go on 06/26/2021.   Specialty: Family Medicine Why: '@3' :15pm Contact information: [address] [phone]         BLucianne Lei MD Follow up.   Specialty: Family Medicine Why: you need follow up with PCP to check kidney function. Contact information: [address] [place] [phone]         OLaurin Coder MD Follow up.   Specialty: Pulmonary Disease Why: Office will call you with appointment. Contact information: [phone]         [facility] Follow  up.   Why: oxygen Contact information: [place] [address] [phone]                 Discharge Exam: Filed Weights   06/23/21 0010 06/24/21 0522 06/25/21 0436  Weight: 89.9 kg 90.2 kg 90.3 kg   General; NAD Lung; CTA Condition at discharge: stable  The results of significant diagnostics from this hospitalization (including imaging, microbiology, ancillary and laboratory) are listed below for reference.   Imaging Studies: DG Chest Port 1 View  Result Date: 06/22/2021 CLINICAL DATA:  Shortness of breath and hypoxia, worsening over the last few weeks. EXAM: PORTABLE CHEST 1 VIEW COMPARISON:  03/20/2020 FINDINGS: Artifact overlies the chest. Heart size is normal. There is aortic atherosclerotic calcification. There is chronic lung disease with widespread pulmonary scarring. Findings consistent with UIP by previous CT. There may be some accentuated density in the left mid lung that could possibly represent a focal pneumonia. No dense consolidation or lobar collapse. No visible effusion. IMPRESSION: Chronic pulmonary fibrosis/UIP. Increased focal opacity in the left mid lung which could represent superimposed pneumonia. Electronically Signed   By: Nelson Chimes M.D.   On: 06/22/2021 17:14   ECHOCARDIOGRAM  COMPLETE  Result Date: 06/23/2021    ECHOCARDIOGRAM REPORT   Patient Name:   AKEEN LEDYARD Date of Exam: 06/23/2021 Medical Rec #:  [id number]      Height:       68.0 in Accession #:    [id number]     Weight:       198.2 lb Date of Birth:  09-07-1940       BSA:          2.036 m Patient Age:    32 years       BP:           129/68 mmHg Patient Gender: M              HR:           84 bpm. Exam Location:  Inpatient Procedure: 2D Echo, Cardiac Doppler, Color Doppler and Intracardiac            Opacification Agent Indications:    Dyspnea  History:        Patient has prior history of Echocardiogram examinations, most                 recent 06/11/2019. Risk Factors:Hypertension.  Sonographer:    Luisa Hart RDCS Referring Phys: [id number] Shanay Woolman A Silvana Holecek  Sonographer Comments: Suboptimal apical window, suboptimal subcostal window and patient is morbidly obese. IMPRESSIONS  1. Left ventricular ejection fraction, by estimation, is >75%. The left ventricle has hyperdynamic function. The left ventricle has no regional wall motion abnormalities. Left ventricular diastolic parameters are consistent with Grade I diastolic dysfunction (impaired relaxation). Elevated left atrial pressure.  2. Right ventricular systolic function is normal. The right ventricular size is normal.  3. The mitral valve is normal in structure. No evidence of mitral valve regurgitation. No evidence of mitral stenosis.  4. The aortic valve is tricuspid. Aortic valve regurgitation is not visualized. Aortic valve sclerosis/calcification is present, without any evidence of aortic stenosis.  5. The inferior vena cava is normal in size with greater than 50% respiratory variability, suggesting right atrial pressure of 3 mmHg. FINDINGS  Left Ventricle: Left ventricular ejection fraction, by estimation, is >75%. The left ventricle has hyperdynamic function. The left ventricle has no regional wall motion abnormalities. Definity contrast agent was given IV to  delineate  the left ventricular endocardial borders. The left ventricular internal cavity size was normal in size. There is no left ventricular hypertrophy. Left ventricular diastolic parameters are consistent with Grade I diastolic dysfunction (impaired relaxation). Elevated left atrial pressure. Right Ventricle: The right ventricular size is normal. Right ventricular systolic function is normal. Left Atrium: Left atrial size was normal in size. Right Atrium: Right atrial size was normal in size. Pericardium: There is no evidence of pericardial effusion. Mitral Valve: The mitral valve is normal in structure. Mild mitral annular calcification. No evidence of mitral valve regurgitation. No evidence of mitral valve stenosis. MV peak gradient, 4.5 mmHg. The mean mitral valve gradient is 2.0 mmHg. Tricuspid Valve: The tricuspid valve is normal in structure. Tricuspid valve regurgitation is not demonstrated. No evidence of tricuspid stenosis. Aortic Valve: The aortic valve is tricuspid. Aortic valve regurgitation is not visualized. Aortic valve sclerosis/calcification is present, without any evidence of aortic stenosis. Aortic valve mean gradient measures 6.0 mmHg. Aortic valve peak gradient measures 10.6 mmHg. Aortic valve area, by VTI measures 2.68 cm. Pulmonic Valve: The pulmonic valve was normal in structure. Pulmonic valve regurgitation is mild. No evidence of pulmonic stenosis. Aorta: The aortic root is normal in size and structure. Venous: The inferior vena cava is normal in size with greater than 50% respiratory variability, suggesting right atrial pressure of 3 mmHg. IAS/Shunts: The interatrial septum was not well visualized.  LEFT VENTRICLE PLAX 2D LVIDd:         4.00 cm   Diastology LVIDs:         2.40 cm   LV e' medial:    4.28 cm/s LV PW:         1.00 cm   LV E/e' medial:  15.3 LV IVS:        1.10 cm   LV e' lateral:   7.97 cm/s LVOT diam:     2.00 cm   LV E/e' lateral: 8.2 LV SV:         73 LV SV Index:   36 LVOT  Area:     3.14 cm  LEFT ATRIUM           Index LA Vol (A4C): 45.5 ml 22.35 ml/m  AORTIC VALVE                     PULMONIC VALVE AV Area (Vmax):    2.78 cm      PV Vmax:          1.31 m/s AV Area (Vmean):   2.75 cm      PV Vmean:         88.000 cm/s AV Area (VTI):     2.68 cm      PV VTI:           0.230 m AV Vmax:           162.50 cm/s   PV Peak grad:     6.9 mmHg AV Vmean:          110.000 cm/s  PV Mean grad:     4.0 mmHg AV VTI:            0.272 m       PR End Diast Vel: 3.02 msec AV Peak Grad:      10.6 mmHg AV Mean Grad:      6.0 mmHg LVOT Vmax:         144.00 cm/s LVOT Vmean:  96.300 cm/s LVOT VTI:          0.232 m LVOT/AV VTI ratio: 0.85  AORTA Ao Root diam: 2.80 cm Ao Asc diam:  3.20 cm MITRAL VALVE MV Area (PHT): 3.08 cm    SHUNTS MV Area VTI:   2.96 cm    Systemic VTI:  0.23 m MV Peak grad:  4.5 mmHg    Systemic Diam: 2.00 cm MV Mean grad:  2.0 mmHg MV Vmax:       1.06 m/s MV Vmean:      61.9 cm/s MV Decel Time: 246 msec MV E velocity: 65.50 cm/s MV A velocity: 77.10 cm/s MV E/A ratio:  0.85 Kirk Ruths MD Electronically signed by Kirk Ruths MD Signature Date/Time: 06/23/2021/3:37:11 PM    Final     Microbiology: Results for orders placed or performed during the hospital encounter of 06/22/21  Resp Panel by RT-PCR (Flu A&B, Covid) Nasopharyngeal Swab     Status: None   Collection Time: 06/22/21  4:49 PM   Specimen: Nasopharyngeal Swab; Nasopharyngeal(NP) swabs in vial transport medium  Result Value Ref Range Status   SARS Coronavirus 2 by RT PCR NEGATIVE NEGATIVE Final    Comment: (NOTE) SARS-CoV-2 target nucleic acids are NOT DETECTED.  The SARS-CoV-2 RNA is generally detectable in upper respiratory specimens during the acute phase of infection. The lowest concentration of SARS-CoV-2 viral copies this assay can detect is 138 copies/mL. A negative result does not preclude SARS-Cov-2 infection and should not be used as the sole basis for treatment or other patient  management decisions. A negative result may occur with  improper specimen collection/handling, submission of specimen other than nasopharyngeal swab, presence of viral mutation(s) within the areas targeted by this assay, and inadequate number of viral copies(<138 copies/mL). A negative result must be combined with clinical observations, patient history, and epidemiological information. The expected result is Negative.  Fact Sheet for Patients:  EntrepreneurPulse.com.au  Fact Sheet for Healthcare Providers:  IncredibleEmployment.be  This test is no t yet approved or cleared by the [place] FDA and  has been authorized for detection and/or diagnosis of SARS-CoV-2 by FDA under an Emergency Use Authorization (EUA). This EUA will remain  in effect (meaning this test can be used) for the duration of the COVID-19 declaration under Section 564(b)(1) of the Act, 21 U.S.C.section 360bbb-3(b)(1), unless the authorization is terminated  or revoked sooner.       Influenza A by PCR NEGATIVE NEGATIVE Final   Influenza B by PCR NEGATIVE NEGATIVE Final    Comment: (NOTE) The Xpert Xpress SARS-CoV-2/FLU/RSV plus assay is intended as an aid in the diagnosis of influenza from Nasopharyngeal swab specimens and should not be used as a sole basis for treatment. Nasal washings and aspirates are unacceptable for Xpert Xpress SARS-CoV-2/FLU/RSV testing.  Fact Sheet for Patients: EntrepreneurPulse.com.au  Fact Sheet for Healthcare Providers: IncredibleEmployment.be  This test is not yet approved or cleared by the [place] FDA and has been authorized for detection and/or diagnosis of SARS-CoV-2 by FDA under an Emergency Use Authorization (EUA). This EUA will remain in effect (meaning this test can be used) for the duration of the COVID-19 declaration under Section 564(b)(1) of the Act, 21 U.S.C. section 360bbb-3(b)(1),  unless the authorization is terminated or revoked.  Performed at [facility], [place] [address], [place], [place] [address]   Respiratory (~20 pathogens) panel by PCR     Status: None   Collection Time: 06/22/21  4:49 PM  Specimen: Nasopharyngeal Swab; Respiratory  Result Value Ref Range Status   Adenovirus NOT DETECTED NOT DETECTED Final   Coronavirus 229E NOT DETECTED NOT DETECTED Final    Comment: (NOTE) The Coronavirus on the Respiratory Panel, DOES NOT test for the novel  Coronavirus (2019 nCoV)    Coronavirus HKU1 NOT DETECTED NOT DETECTED Final   Coronavirus NL63 NOT DETECTED NOT DETECTED Final   Coronavirus OC43 NOT DETECTED NOT DETECTED Final   Metapneumovirus NOT DETECTED NOT DETECTED Final   Rhinovirus / Enterovirus NOT DETECTED NOT DETECTED Final   Influenza A NOT DETECTED NOT DETECTED Final   Influenza B NOT DETECTED NOT DETECTED Final   Parainfluenza Virus 1 NOT DETECTED NOT DETECTED Final   Parainfluenza Virus 2 NOT DETECTED NOT DETECTED Final   Parainfluenza Virus 3 NOT DETECTED NOT DETECTED Final   Parainfluenza Virus 4 NOT DETECTED NOT DETECTED Final   Respiratory Syncytial Virus NOT DETECTED NOT DETECTED Final   Bordetella pertussis NOT DETECTED NOT DETECTED Final   Bordetella Parapertussis NOT DETECTED NOT DETECTED Final   Chlamydophila pneumoniae NOT DETECTED NOT DETECTED Final   Mycoplasma pneumoniae NOT DETECTED NOT DETECTED Final    Comment: Performed at [facility], [place]. [address] [place], [place] [address]    Labs: CBC: Recent Labs  Lab 06/22/21 1733 06/23/21 0201  WBC 7.6 5.7  NEUTROABS 5.9  --   HGB 13.7 12.9*  HCT 40.8 37.7*  MCV 93.6 93.3  PLT 195 947   Basic Metabolic Panel: Recent Labs  Lab 06/22/21 1733 06/23/21 0201 06/24/21 1044  NA 136 135 137  K 4.5 4.8 4.8  CL 103 105 104  CO2 22 17* 22  GLUCOSE 120* 161* 118*  BUN 26* 28* 29*  CREATININE 1.70* 1.64* 1.44*  CALCIUM 9.2 8.9 9.1   Liver Function  Tests: Recent Labs  Lab 06/23/21 0201  AST 31  ALT 19  ALKPHOS 43  BILITOT 0.7  PROT 6.7  ALBUMIN 3.2*   CBG: No results for input(s): GLUCAP in the last 168 hours.  Discharge time spent: greater than 30 minutes.  Signed: Elmarie Shiley, MD [facility] 06/25/2021

## 2021-06-25 NOTE — Discharge Instructions (Signed)
You have two new medications, Prednisone to help with ILD, and antibiotics to treat Pneurmonia.   You need to follow up with PCP to check kidney function. Stop taking Diovan/HCTZ which could affect your kidney function if your dehydrated.

## 2021-06-25 NOTE — TOC Transition Note (Addendum)
Transition of Care [facility]) - CM/SW Discharge Note   Patient Details  Name: [name] MRN: [id number] Date of Birth: 12/03/1940  Transition of [facility]) CM/SW Contact:  Zenon Mayo, RN Phone Number: 06/25/2021, 10:01 AM   Clinical Narrative:    Patient is for dc today, going home, he will need home oxygen,.  Patient states he is ok with using [employer] for oxygen.  NCM,  made referral to [facility] with [employer] for home oxygen. Patient states wife will be transporting him home .         Patient Goals and CMS Choice        Discharge Placement                       Discharge Plan and Services                                     Social Determinants of Health (SDOH) Interventions     Readmission Risk Interventions No flowsheet data found.

## 2021-06-26 DIAGNOSIS — E785 Hyperlipidemia, unspecified: Secondary | ICD-10-CM | POA: Diagnosis not present

## 2021-06-26 DIAGNOSIS — I10 Essential (primary) hypertension: Secondary | ICD-10-CM | POA: Diagnosis not present

## 2021-06-26 DIAGNOSIS — M0579 Rheumatoid arthritis with rheumatoid factor of multiple sites without organ or systems involvement: Secondary | ICD-10-CM | POA: Diagnosis not present

## 2021-06-26 DIAGNOSIS — J849 Interstitial pulmonary disease, unspecified: Secondary | ICD-10-CM | POA: Diagnosis not present

## 2021-06-26 DIAGNOSIS — Z79899 Other long term (current) drug therapy: Secondary | ICD-10-CM | POA: Diagnosis not present

## 2021-06-26 DIAGNOSIS — M069 Rheumatoid arthritis, unspecified: Secondary | ICD-10-CM | POA: Diagnosis not present

## 2021-06-26 NOTE — Telephone Encounter (Signed)
Patient is scheduled with Dr. Chase Caller 06/29/2021 at 9:30am- Patient aware.

## 2021-06-29 ENCOUNTER — Telehealth: Payer: Self-pay | Admitting: Internal Medicine

## 2021-06-29 ENCOUNTER — Ambulatory Visit: Payer: Medicare Other | Admitting: Internal Medicine

## 2021-06-29 ENCOUNTER — Encounter: Payer: Self-pay | Admitting: Internal Medicine

## 2021-06-29 ENCOUNTER — Other Ambulatory Visit: Payer: Self-pay

## 2021-06-29 VITALS — BP 122/64 | HR 89 | Temp 97.7°F | Ht 68.0 in | Wt 202.8 lb

## 2021-06-29 DIAGNOSIS — M359 Systemic involvement of connective tissue, unspecified: Secondary | ICD-10-CM | POA: Diagnosis not present

## 2021-06-29 DIAGNOSIS — M0510 Rheumatoid lung disease with rheumatoid arthritis of unspecified site: Secondary | ICD-10-CM | POA: Diagnosis not present

## 2021-06-29 DIAGNOSIS — Z79899 Other long term (current) drug therapy: Secondary | ICD-10-CM | POA: Diagnosis not present

## 2021-06-29 DIAGNOSIS — J84112 Idiopathic pulmonary fibrosis: Secondary | ICD-10-CM

## 2021-06-29 DIAGNOSIS — J8489 Other specified interstitial pulmonary diseases: Secondary | ICD-10-CM

## 2021-06-29 DIAGNOSIS — Z79631 Long term (current) use of antimetabolite agent: Secondary | ICD-10-CM | POA: Diagnosis not present

## 2021-06-29 MED ORDER — PREDNISONE 10 MG PO TABS: ORAL_TABLET | ORAL | 0 refills | Status: AC

## 2021-06-29 NOTE — Patient Instructions (Addendum)
ICD-10-CM   ?1. Interstitial lung disease due to connective tissue disease ([place])  J84.89   ? M35.9   ?2. Methotrexate, long term, current use  Z79.899   ?3. Rheumatoid lung disease ([place])  M05.10   ?4. UIP (usual interstitial pneumonitis) ([place])  J84.112   ? ?Very likely had Pulmonary Fibrosis flare up ?If so then reversing back fully to prior health status as of Dec 2022 or JAn 2023 can be difficult ?Partial improvement is seen and this is good news ?Let us move towards preventing any future decline and also see if we  can improve things any further ? ? ?Plan ?- Change prednisone as follows - Please take Take prednisone 40mg  once daily x 4 days, then 30mg  once daily x 4 days, then 20mg  once daily x 4 days, then prednisone 10mg  once daily  x 4 days and then prednisone 5mg  daily x 4 days and stop ? ?- ASk Leafy Kindle to change methotrexate to immuran  or Actemra for RA joints and pulmonary fibrosis ? ? -start ofev 150mg  once daily x 1 week and then 150mg  twice daily ? - visith with pharmacist for counseling ? ?- do HRCT supine and prone in 4-6 weeks ? ? - spirometry and dlco in 4-6 weeks ? ?- take ILD-PRO registry consent to read and understand (no need to sign) ? - we will contact you if you qualify ? ? - can consider bronchoscopy at folllowup dependong on course ? ?- stop fish oil (not sure of beneifts with cholesterol but can make acid reflux worse which is one mechanism for Pulmonry fibrosis flare up) ? ? ?Follow-up ?-4-6 wekks or sooner if needed but  after breathing test and CT ? - to avoid wait frustration of waiting book pft and visit appt on different days ? - book visit appt first 1-2 appointments for the morning or afternoon ? ?

## 2021-06-29 NOTE — Progress Notes (Signed)
OV 07/15/2019  Subjective:  Patient ID: [name], male , DOB: 1940-06-29 , age 81 y.o. , MRN: [id number] , ADDRESS: [address] [place] [address]   07/15/2019 -   Chief Complaint  Patient presents with   Consult    Pt referred by Dr. Ander Mills to see MR for ILD consult. Pt denies any complaints or concerns and states he has been doing fine since last visit.     HPI [name] 81 y.o.  -patient of Dr. Jenetta Mills.  Presented in January 2021 to the hospital with right-sided pneumothorax that was spontaneous.  He was treated with pleurodesis.  I remember meeting him on 1 day during cross cover at that time.  He tells me that throughout the course of 2020 during the time of the pandemic he was having insidious onset of shortness of breath that was progressive.  Then diagnosed with right-sided pneumothorax.  None after the pleurodesis all the symptoms have completely resolved and is at baseline.  Currently is not having any dyspnea.  However high-resolution CT scan of the chest at that time showed evidence of UIP [I personally visualized and interpreted the CT].  He tells me that he has a diagnosis of rheumatoid arthritis for over 20 years.  And for all this 20 years he has been on methotrexate no other agent.  Previously many years ago was on prednisone but currently not on prednisone.  He feels stable.  No cough  He is accompanied by his daughter [name] who is also giving the history.  Referred now to ILD center  [facility] Comprehensive ILD Questionnaire  Symptoms:  -Shortness of breath is actually resolved after treatment of the pneumothorax.   SYMPTOM SCALE - ILD 07/15/2019   O2 use ra  Shortness of Breath 0 -> 5 scale with 5 being worst (score 6 If unable to do)  At rest 0  Simple tasks - showers, clothes change, eating, shaving 0  Household (dishes, doing bed, laundry) 0  Shopping 0  Walking level at own pace 0  Walking up Stairs 0  Total (30-36) Dyspnea Score 0   How bad is your cough? 0  How bad is your fatigue 0  How bad is nausea 0  How bad is vomiting?  0  How bad is diarrhea? 0  How bad is anxiety? 0  How bad is depression 0       Past Medical History : Positive for rheumatoid arthritis for the last several years.  In fact 20 years.  Treated with methotrexate along.  In the remote past was on prednisone.  Also has symptoms of acid reflux here and there.  Otherwise negative.   ROS: Negative for dyspnea fatigue.  Any collagen vascular.  Positive for acid reflux disease for the last few days.  Also has snoring and early morning headaches excessive daytime somnolence for the last few years.   FAMILY HISTORY of LUNG DISEASE: No one in the family other than him has pulmonary fibrosis or any other lung disease.   EXPOSURE HISTORY: Denies any smoking of cigarettes or marijuana or vaping or electronic cigarettes or electronic vaping.  Denies any cocaine or marijuana use.   HOME and HOBBY DETAILS : [place] last 40 [place].  No dampness in the house.  No mold or mildew.  No humidifier no CPAP use.  No steam iron use.  No Jacuzzi use.  No misting Fountain use.  No pet birds  or parakeets.  No pet gerbils.  No feather pillows.  No mold in the [facility] duct.  No music habits.  No guarding habits.  No exposure to birds.   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  With inorganic antigens no exposures.   PULMONARY TOXICITY HISTORY (27 items): Positive for methotrexate for many years.  Possibly 2 decades.    Simple office walk 185 feet x  3 laps goal with forehead probe 07/15/2019   O2 used ra  Number laps completed 3  Comments about pace avg  Resting Pulse Ox/HR 100% and 74/min  Final Pulse Ox/HR 95% and 96/min  Desaturated </= 88% no  Desaturated <= 3% points Yes, 5  Got Tachycardic >/= 90/min yes  Symptoms at end of test  none  Miscellaneous comments x     OV 10/05/2019  Subjective:  Patient ID: [name], male , DOB: 20-Jul-1940 , age 25 y.o. , MRN: [id number] , ADDRESS: [place] [address]   10/05/2019 -   Chief Complaint  Patient presents with   Follow-up    Pt states he has been doing good since last visit and denies any complaints.     HPI [name] 81 y.o. -follows up for his UIP with rheumatoid arthritis interstitial lung disease.  Since January 2021 pneumothorax has not had any further issues.  His respiratory symptoms are stable.  He feels his overall dyspnea is stable.  He only has a mild cough.  There seems to be a slight symptom change compared to March 2021 but he states he is stable.  His wife is here with him and I am meeting her for the first time.  We went over the goals of care.  He is very active and likes to be active.  They are on mind taking medications as long as the side effects can be monitored and it is a medical indication/recommendation to do so.  Today the wife has a new complaint and the concern:Wife is concerned about him snoring.  She has seen him intermittently very occasionally stop breathing in the middle of the night.  In the daytime he has excessive fatigue and sleepiness.  His BMI is less than 35.  His neck circumference was not measured but probably less than 40.  He has hypertension.  His stop bang score is 6 high risk for OSA.    ROS - per HPI  IMPRESSION: CT chest high resoltuon 1. Chronic lung changes compatible with interstitial lung disease, with a spectrum of findings indicative of usual interstitial pneumonia (UIP) per current ATS guidelines. No progression of disease compared to the prior study. 2. Aortic atherosclerosis, in addition to left main and 3 vessel coronary artery disease.   Aortic Atherosclerosis (ICD10-I70.0).     Electronically Signed   By: [name] M.D.   On: 09/30/2019 15:50  OV  05/18/2020  Subjective:  Patient ID: [name], male , DOB: [birth year] , age 72 y.o. , MRN: [id number] , ADDRESS: [place]  [id number] PCP Pedro Lei, MD Patient Care Team: Pedro Lei, MD as PCP - General (Family Medicine)  This Provider for this visit: Treatment Team:  Attending Provider: Brand Males, MD    05/18/2020 -   Chief Complaint  Patient presents with   Follow-up    Pt states he has been doing good since last visit and denies any complaints.   Follow-up rheumatoid  arthritis related ILD UIP pattern. Follows with Dr. Amil Amen for rheumatoid arthritis On expectant approach with the ILD  HPI [name] 81 y.o. -presents for follow-up.  Last seen several months ago.  Since then he continues to be stable.  He has mild shortness of breath when laying yard work.  Otherwise he is good.  He sees Dr. Jenetta Mills for sleep apnea.  There are no new issues.  He is on methotrexate which controls his joint pain.  He is up-to-date with his vaccines.  He has not lost any weight.    PFT   OV 12/29/2020  Subjective:  Patient ID: [name], male , DOB: 21-Jan-1941 , age 1 y.o. , MRN: [id number] , ADDRESS: [place] [address] PCP Pedro Lei, MD Patient Care Team: Pedro Lei, MD as PCP - General (Family Medicine)  This Provider for this visit: Treatment Team:  Attending Provider: Brand Males, MD    12/29/2020 -   Chief Complaint  Patient presents with   Follow-up    PFT  performed today.  Pt states he is about the same since last visit.    Follow-up rheumatoid arthritis related ILD UIP pattern. Follows with Dr. Amil Amen for rheumatoid arthritis On expectant approach with the ILD  HPI [name] 81 y.o. -presents for follow-up.  He had high-resolution CT chest July 2022.  It shows chronic stability.  He had his first pulmonary function test.  Previously not done because of pneumothorax.  His FVC is normal.  His DLCO is reduced at  56%.  He says he can probably do better with DLCO if he understood the technique better.  His symptom score is minimal and stable as documented below.  He does not think he is declining.  Of note he is frustrated by the delay.  I was 45 minutes late seeing him.  Had apologized.  We discussed some strategies about how to reduce the frustration of waiting because he also did pulmonary function test today.  Also discussed that I would do my best to try and go on time.    HRCT 11/20/20  Narrative & Impression  CLINICAL DATA:  Interstitial lung disease   EXAM: CT CHEST WITHOUT CONTRAST   TECHNIQUE: Multidetector CT imaging of the chest was performed following the standard protocol without intravenous contrast. High resolution imaging of the lungs, as well as inspiratory and expiratory imaging, was performed.   COMPARISON:  09/30/2019, 05/28/2019   FINDINGS: Cardiovascular: Aortic atherosclerosis. Normal heart size. Three-vessel coronary artery calcifications. No pericardial effusion.   Mediastinum/Nodes: No enlarged mediastinal, hilar, or axillary lymph nodes. Small hiatal hernia. Thyroid gland, trachea, and esophagus demonstrate no significant findings.   Lungs/Pleura: Redemonstrated pattern of moderate to severe pulmonary fibrosis with apical to basal gradient featuring irregular peripheral interstitial opacity, subpleural bronchiolectasis, and extensive areas of honeycombing at the bilateral lung bases. Fibrotic findings are not significantly changed compared to prior examinations. No significant air trapping on expiratory phase imaging. Wedge biopsy of the right lung. No pleural effusion or pneumothorax.   Upper Abdomen: No acute abnormality.   Musculoskeletal: No chest wall mass or suspicious bone lesions identified.   IMPRESSION: 1. Redemonstrated pattern of moderate to severe pulmonary fibrosis with apical to basal gradient featuring irregular peripheral interstitial  opacity, subpleural bronchiolectasis, and extensive areas of honeycombing at the bilateral lung bases. Fibrotic findings are not significantly changed compared to prior examinations. Findings are consistent with UIP per consensus guidelines: Diagnosis of Idiopathic Pulmonary Fibrosis: An Official ATS/ERS/JRS/ALAT  Clinical Practice Guideline. [place], Iss 5, [phone], Dec 28 2016. 2. Coronary artery disease.   Aortic Atherosclerosis (ICD10-I70.0).     Electronically Signed   By: Eddie Candle M.D.   On: 11/20/2020 21:00      No results found.       OV 06/29/2021  Subjective:  Patient ID: [name], male , DOB: [birth year] , age 31 y.o. , MRN: [id number] , ADDRESS: [address] [place] [address] PCP Pedro Lei, MD Patient Care Team: Pedro Lei, MD as PCP - General (Family Medicine)  This Provider for this visit: Treatment Team:  Attending Provider: Brand Males, MD    06/29/2021 -   Chief Complaint  Patient presents with   Hospitalization Follow-up    Pt recently in the hospital 2/24-2/27. Pt states he is doing better since the hospital stay.   Follow-up rheumatoid arthritis related ILD UIP pattern. - Follows with Dr. Amil Amen for rheumatoid arthritis- methtorexate - On expectant approach with the ILD  Hx of pneumothorax - Jan 2021  Small Hiatal Hernia see on CT  HPI [name] 81 y.o. -presents with his wife Gio Janoski and daughter Lanette Hampshire.  I just saw him in September 2022.  After that he was doing well and stable.  However approximately 3 weeks ago started getting insidious onset of worsening shortness of breath.  Janett Billow became progressive him to urgent care on 06/22/2021.  He was hypoxemic and sent to the hospital.  He spent 3 days and discharged 06/25/2021.  There is no fever or edema.  He did have some cough that was baseline but in the hospital it was much worse.  The discharge diagnosis was acute hypoxemic  respiratory failure secondary to ILD flare.Marland Kitchen  His COVID was negative, respiratory virus panel was negative troponin negative and BNP and was normal.  In my personal review of the chart I agree with the diagnosis.  He was treated with IV antibiotics and oral prednisone.  He got 7 days of antibiotics.  He is currently on a prednisone taper.  He feels much better.  He is still on 2 L nasal cannula and he feels he can manage with just 2 L nasal cannula.  Here in our office room air at rest he was fine but as soon as he walked past 50 feet he desaturated.  He needed 3 L to correct and do a standard simple walk test in our office.  This is a significant change compared to his baseline.  His symptom scores are also worse although overall he is better than the hospitalization.  I think his methotrexate is currently on hold. Med review shows that he is also on omega-3.      SYMPTOM SCALE - ILD 07/15/2019  10/05/2019  12/29/2020  06/29/2021   O2 use ra ra ra 2L [place]  Shortness of Breath 0 -> 5 scale with 5 being worst (score 6 If unable to do)  1   At rest 0 0 1 3  Simple tasks - showers, clothes change, eating, shaving 0 0 0 3  Household (dishes, doing bed, laundry) 0 0 0 3  Shopping 0 0 0 3  Walking level at own pace 0 2 1 4   Walking up Stairs 0 0 1 4  Total (30-36) Dyspnea Score 0 2 3 20   How bad is your cough? 0 1 1 2   How bad is your fatigue 0 1 2 1   How bad  is nausea 0 0 0 0  How bad is vomiting?  0 0 0 0  How bad is diarrhea? 0 0 0 0  How bad is anxiety? 0 0 00 0  How bad is depression 0 0 0 0  0   Simple office walk 185 feet x  3 laps goal with forehead probe 07/15/2019  10/05/2019  05/18/2020  06/29/2021   O2 used ra ra ra ra  Number laps completed 3 3  [place]at at 1/2 lap  Comments about pace avg 100% and 68 100% and 75/min 96% and HR 86  Resting Pulse Ox/HR 100% and 74/min 97% and 93 96% and 100/min 83% and HR 118  Final Pulse Ox/HR 95% and 96/min no    Desaturated </= 88% no no     Desaturated <= 3% points Yes, 5 Uyes, 3    Got Tachycardic >/= 90/min yes none    Symptoms at end of test none none No complaints   Miscellaneous comments x avg pace abg Neede 3L [place] with exrtion to crrect      PFT  PFT Results Latest Ref Rng & Units 12/29/2020  FVC-Pre L 2.96  FVC-Predicted Pre % 86  Pre FEV1/FVC % % 64  FEV1-Pre L 1.91  FEV1-Predicted Pre % 77  DLCO uncorrected ml/min/mmHg 13.19  DLCO UNC% % 56  DLCO corrected ml/min/mmHg 13.19  DLCO COR %Predicted % 56  DLVA Predicted % 90    ECHO feb 2023 IMPRESSIONS     1. Left ventricular ejection fraction, by estimation, is >75%. The left  ventricle has hyperdynamic function. The left ventricle has no regional  wall motion abnormalities. Left ventricular diastolic parameters are  consistent with Grade I diastolic  dysfunction (impaired relaxation). Elevated left atrial pressure.   2. Right ventricular systolic function is normal. The right ventricular  size is normal.   3. The mitral valve is normal in structure. No evidence of mitral valve  regurgitation. No evidence of mitral stenosis.   4. The aortic valve is tricuspid. Aortic valve regurgitation is not  visualized. Aortic valve sclerosis/calcification is present, without any  evidence of aortic stenosis.   5. The inferior vena cava is normal in size with greater than 50%  respiratory variability, suggesting right atrial pressure of 3 mmHg.     has a past medical history of Adenomatous colon polyp, Collapsed lung (04/2018), Diverticulosis, GERD (gastroesophageal reflux disease), Hemorrhoids, Hiatal hernia, Hypertension, Pneumonia (11/2019), Rheumatoid arthritis ([place]), and S/P dilatation of esophageal stricture.   reports that he has never smoked. He has never used smokeless tobacco.  Past Surgical History:  Procedure Laterality Date   CIRCUMCISION     HEMORRHOID SURGERY  2002   PLEURADESIS Right 06/14/2019   Procedure: PLEURADESIS USING TALC;  Surgeon: [name], MD;  Location: [facility];  Service: Thoracic;  Laterality: Right;   STAPLING OF BLEBS Right 06/14/2019   Procedure: STAPLING OF BLEBS;  Surgeon: [name], MD;  Location: [facility];  Service: Thoracic;  Laterality: Right;   [place] Right 06/14/2019   Procedure: VIDEO ASSISTED THORACOSCOPY WITH RIGHT UPPER LOBE WEDGE;  Surgeon: [name], MD;  Location: [facility];  Service: Thoracic;  Laterality: Right;    No Known Allergies  Immunization History  Administered Date(s) Administered   Influenza, High Dose Seasonal PF 01/31/2018, 12/15/2018, 12/28/2020   Influenza,inj,Quad PF,6+ Mos 01/24/2020   PFIZER(Purple Top)SARS-COV-2 Vaccination 05/21/2019, 06/24/2019, 04/10/2020    Family History  Problem Relation Age of  Onset   Prostate cancer Paternal Uncle    Colon cancer Neg Hx    Throat cancer Neg Hx    Diabetes Neg Hx    Kidney disease Neg Hx    Liver disease Neg Hx      Current Outpatient Medications:    acetaminophen (TYLENOL) 500 MG tablet, Take 2 tablets (1,000 mg total) by mouth every 6 (six) hours. (Patient taking differently: Take 1,000 mg by mouth every 6 (six) hours as needed.), Disp: 30 tablet, Rfl: 0   amLODipine (NORVASC) 10 MG tablet, Take 1 tablet (10 mg total) by mouth daily., Disp: 30 tablet, Rfl: 0   aspirin EC 81 MG tablet, Take 81 mg by mouth daily., Disp: , Rfl:    atorvastatin (LIPITOR) 10 MG tablet, Take 10 mg by mouth daily., Disp: , Rfl:    cetirizine (ZYRTEC) 10 MG tablet, Take 1 tablet (10 mg total) by mouth 2 (two) times daily as needed for allergies (Can take an extra dose during flare ups). Take 1 tablet 1-2 times daily as needed., Disp: 60 tablet, Rfl: 11   FARXIGA 10 MG TABS tablet, Take 10 mg by mouth daily., Disp: , Rfl:    fluticasone (FLONASE) 50 MCG/ACT nasal spray, Place 2 sprays into both nostrils daily., Disp: 16 g, Rfl: 11   FOLIC ACID PO, Take 1 tablet by mouth daily., Disp: , Rfl:    guaiFENesin (MUCINEX) 600 MG  12 hr tablet, Take 1 tablet (600 mg total) by mouth 2 (two) times daily., Disp: 30 tablet, Rfl: 0   Menthol-Methyl Salicylate (MUSCLE RUB) 10-15 % CREA, Apply 1 application topically as needed for muscle pain (shoulder/neck)., Disp: , Rfl:    methotrexate (RHEUMATREX) 2.5 MG tablet, Take 10 mg by mouth See admin instructions. Take 10mg  every Thursday and Friday, Disp: , Rfl:    montelukast (SINGULAIR) 10 MG tablet, Take 1 tablet (10 mg total) by mouth at bedtime., Disp: 90 tablet, Rfl: 4   Olopatadine HCl (PATADAY) 0.2 % SOLN, Place 1 drop into both eyes daily. Use 1 drop in each eye once daily as needed., Disp: 2.5 mL, Rfl: 11   omeprazole (PRILOSEC) 20 MG capsule, TAKE 1 CAPSULE BY MOUTH EVERY DAY (Patient taking differently: Take 20 mg by mouth daily.), Disp: 90 capsule, Rfl: 3   predniSONE (DELTASONE) 10 MG tablet, Take 4 tablets (40 mg total) by mouth daily with breakfast for 4 days, THEN 3 tablets (30 mg total) daily with breakfast for 4 days, THEN 2 tablets (20 mg total) daily with breakfast for 4 days, THEN 1 tablet (10 mg total) daily with breakfast for 4 days, THEN 0.5 tablets (5 mg total) daily with breakfast for 4 days., Disp: 42 tablet, Rfl: 0      Objective:   Vitals:   06/29/21 0935  BP: 122/64  Pulse: 89  Temp: 97.7 F (36.5 C)  TempSrc: Oral  SpO2: 96%  Weight: 202 lb 12.8 oz (92 kg)  Height: 5\' 8"  (1.727 m)    Estimated body mass index is 30.84 kg/m as calculated from the following:   Height as of this encounter: 5\' 8"  (1.727 m).   Weight as of this encounter: 202 lb 12.8 oz (92 kg).  @WEIGHTCHANGE @  [employer]   06/29/21 0935  Weight: 202 lb 12.8 oz (92 kg)     Physical exam   General: No distress. Overeight/obese Neuro: Alert and Oriented x 3. GCS 15. Speech normal Psych: Pleasant Resp:  Barrel Chest - no.  Wheeze - no, Crackles - yes, No overt respiratory distress CVS: Normal heart sounds. Murmurs - no Ext: Stigmata of Connective Tissue Disease -  no HEENT: Normal upper airway. PEERL +. No post nasal drip        Assessment:       ICD-10-CM   1. Interstitial lung disease due to connective tissue disease ([place])  J84.89 Pulmonary function test   M35.9 CT Chest High Resolution    2. Methotrexate, long term, current use  Z79.631     3. Rheumatoid lung disease ([place])  M05.10     4. UIP (usual interstitial pneumonitis) ([place])  J84.112     5. High risk medication use  Z79.899          Plan:     Patient Instructions     ICD-10-CM   1. Interstitial lung disease due to connective tissue disease ([place])  J84.89    M35.9   2. Methotrexate, long term, current use  Z79.899   3. Rheumatoid lung disease ([place])  M05.10   4. UIP (usual interstitial pneumonitis) ([place])  J84.112    Very likely had Pulmonary Fibrosis flare up If so then reversing back fully to prior health status as of Dec 2022 or JAn 2023 can be difficult Partial improvement is seen and this is good news Let us move towards preventing any future decline and also see if we  can improve things any further   Plan - Change prednisone as follows - Please take Take prednisone 40mg  once daily x 4 days, then 30mg  once daily x 4 days, then 20mg  once daily x 4 days, then prednisone 10mg  once daily  x 4 days and then prednisone 5mg  daily x 4 days and stop  - ASk Leafy Kindle to change methotrexate to immuran  or Actemra for RA joints and pulmonary fibrosis   -start ofev 150mg  once daily x 1 week and then 150mg  twice daily  - visith with pharmacist for counseling  - do HRCT supine and prone in 4-6 weeks   - spirometry and dlco in 4-6 weeks  - take ILD-PRO registry consent to read and understand (no need to sign)  - we will contact you if you qualify   - can consider bronchoscopy at folllowup dependong on course  - stop fish oil (not sure of beneifts with cholesterol but can make acid reflux worse which is one mechanism for Pulmonry fibrosis flare up)   Follow-up -4-6  wekks or sooner if needed but  after breathing test and CT  - to avoid wait frustration of waiting book pft and visit appt on different days  - book visit appt first 1-2 appointments for the morning or afternoon   ( Level 05 visit: Estb 40-54 min   in  visit type: on-site physical face to visit  in total care time and counseling or/and coordination of care by this undersigned MD - Dr Brand Males. This includes one or more of the following on this same day 06/29/2021: pre-charting, chart review, note writing, documentation discussion of test results, diagnostic or treatment recommendations, prognosis, risks and benefits of management options, instructions, education, compliance or risk-factor reduction. It excludes time spent by the [facility] or office staff in the care of the patient. Actual time 18 min)   SIGNATURE    Dr. Brand Males, M.D., F.C.C.P,  Pulmonary and Critical Care Medicine Staff Physician, [facility] Director - Interstitial Lung Disease  Program  Pulmonary [place]  [facility] at [employer], [place], [address]  Pager: [phone], If no answer or between  15:00h - 7:00h: call [phone] Telephone: [phone]  1:06 PM 06/29/2021

## 2021-06-29 NOTE — Telephone Encounter (Signed)
Patient was scheduled for PFT on 08/03/2021 and return ov on 08/16/2021. The CT is in the works to be scheduled by [employer]. ?

## 2021-06-29 NOTE — Telephone Encounter (Signed)
[facility]/Front desk ? ?MAde error I instruction - followup after al investigation is in 4-6 weeks. Not 4-6 months ? ?THanks ? ? ? ?SIGNATURE  ? ? ?Dr. Brand Males, M.D., F.C.C.P,  ?Pulmonary and Critical Care Medicine ?Staff Physician, [facility] ?Center Director - Interstitial Lung Disease  Program  ?Pulmonary [place] [facility] ?[place], [place], [address] ? ?NPI Number:  NPI #[id number] ?DEA Number: [id number] ? ?Pager: [phone], If no answer  -> Check AMION or Try [phone] ?Telephone (clinical office): [phone] ?Telephone (research): [phone] ? ?1:05 PM ?06/29/2021 ? ?

## 2021-07-03 NOTE — Telephone Encounter (Signed)
Thanks ? ? ? ?SIGNATURE  ? ? ?Dr. Brand Males, M.D., F.C.C.P,  ?Pulmonary and Critical Care Medicine ?Staff Physician, [facility] ?Center Director - Interstitial Lung Disease  Program  ?Pulmonary [place] [facility] ?[place], [place], [address] ? ?NPI Number:  NPI #[id number] ?DEA Number: [id number] ? ?Pager: [phone], If no answer  -> Check AMION or Try [phone] ?Telephone (clinical office): [phone] ?Telephone (research): [phone] ? ?7:31 PM ?07/03/2021 ? ?

## 2021-07-05 ENCOUNTER — Other Ambulatory Visit (HOSPITAL_COMMUNITY): Payer: Self-pay

## 2021-07-05 ENCOUNTER — Telehealth: Payer: Self-pay

## 2021-07-05 DIAGNOSIS — J8489 Other specified interstitial pulmonary diseases: Secondary | ICD-10-CM

## 2021-07-05 NOTE — Telephone Encounter (Addendum)
Received New start paperwork for OFEV. Will update as we work through the benefits process. ? ?Submitted a Prior Authorization request to [facility] for OFEV via CoverMyMeds. Will update once we receive a response. ? ? ?Key: [id number] ? ? ?Both provider and pt portions have been received, along with income documentation. Placed in PAP Pending folder. ?

## 2021-07-05 NOTE — Telephone Encounter (Addendum)
Received notification from [facility] regarding a prior authorization for [place]. Authorization has been APPROVED from 07/05/2021 to 04/28/2022. Approval letter sent to scan center. ? ?Per test claim, copay for 30 days supply is $3,160.79 ? ?Patient can fill through [facility]: [phone]  ? ?Authorization # [id number] ? ? ?Working to finalize all necessary documents for [employer] ?

## 2021-07-06 NOTE — Telephone Encounter (Signed)
Submitted Patient Assistance Application to [employer] for OFEV along with provider portion, PA and income documents. Will update patient when we receive a response.  Fax# [phone] Phone# [phone] 

## 2021-07-12 DIAGNOSIS — M0510 Rheumatoid lung disease with rheumatoid arthritis of unspecified site: Secondary | ICD-10-CM | POA: Diagnosis not present

## 2021-07-12 DIAGNOSIS — G4730 Sleep apnea, unspecified: Secondary | ICD-10-CM | POA: Diagnosis not present

## 2021-07-12 DIAGNOSIS — M0609 Rheumatoid arthritis without rheumatoid factor, multiple sites: Secondary | ICD-10-CM | POA: Diagnosis not present

## 2021-07-18 DIAGNOSIS — J849 Interstitial pulmonary disease, unspecified: Secondary | ICD-10-CM | POA: Diagnosis not present

## 2021-07-23 DIAGNOSIS — G4733 Obstructive sleep apnea (adult) (pediatric): Secondary | ICD-10-CM | POA: Diagnosis not present

## 2021-07-23 DIAGNOSIS — J9601 Acute respiratory failure with hypoxia: Secondary | ICD-10-CM | POA: Diagnosis not present

## 2021-07-23 DIAGNOSIS — N1832 Chronic kidney disease, stage 3b: Secondary | ICD-10-CM | POA: Diagnosis not present

## 2021-07-23 DIAGNOSIS — I10 Essential (primary) hypertension: Secondary | ICD-10-CM | POA: Diagnosis not present

## 2021-07-27 DIAGNOSIS — E785 Hyperlipidemia, unspecified: Secondary | ICD-10-CM | POA: Diagnosis not present

## 2021-07-27 DIAGNOSIS — J849 Interstitial pulmonary disease, unspecified: Secondary | ICD-10-CM | POA: Diagnosis not present

## 2021-07-27 DIAGNOSIS — I10 Essential (primary) hypertension: Secondary | ICD-10-CM | POA: Diagnosis not present

## 2021-07-27 DIAGNOSIS — M069 Rheumatoid arthritis, unspecified: Secondary | ICD-10-CM | POA: Diagnosis not present

## 2021-07-30 ENCOUNTER — Ambulatory Visit (INDEPENDENT_AMBULATORY_CARE_PROVIDER_SITE_OTHER)
Admission: RE | Admit: 2021-07-30 | Discharge: 2021-07-30 | Disposition: A | Discharge status: Home / Self Care | Payer: Medicare Other | Source: Ambulatory Visit | Attending: Internal Medicine | Admitting: Internal Medicine

## 2021-07-30 DIAGNOSIS — M359 Systemic involvement of connective tissue, unspecified: Secondary | ICD-10-CM

## 2021-07-30 DIAGNOSIS — J8489 Other specified interstitial pulmonary diseases: Secondary | ICD-10-CM | POA: Diagnosis not present

## 2021-07-30 DIAGNOSIS — J84112 Idiopathic pulmonary fibrosis: Secondary | ICD-10-CM | POA: Diagnosis not present

## 2021-07-30 DIAGNOSIS — I2510 Atherosclerotic heart disease of native coronary artery without angina pectoris: Secondary | ICD-10-CM | POA: Diagnosis not present

## 2021-07-30 DIAGNOSIS — I700 Atherosclerosis of aorta: Secondary | ICD-10-CM | POA: Diagnosis not present

## 2021-07-30 DIAGNOSIS — R918 Other nonspecific abnormal finding of lung field: Secondary | ICD-10-CM | POA: Diagnosis not present

## 2021-07-30 NOTE — Progress Notes (Signed)
ILD some worse. Let thhem know to keep appt for PFT and office vsiit

## 2021-08-02 DIAGNOSIS — Z79899 Other long term (current) drug therapy: Secondary | ICD-10-CM | POA: Diagnosis not present

## 2021-08-02 DIAGNOSIS — M0579 Rheumatoid arthritis with rheumatoid factor of multiple sites without organ or systems involvement: Secondary | ICD-10-CM | POA: Diagnosis not present

## 2021-08-02 MED ORDER — OFEV 150 MG PO CAPS: ORAL_CAPSULE | ORAL | 5 refills | Status: AC

## 2021-08-02 NOTE — Telephone Encounter (Signed)
Per [employer] rep, patient's application for Ofev is missing prescription. Will escribe rx to [facility] and await determination letter from [facility]. ? ?Dose: '150mg'$  once daily x 7 days then '150mg'$  twice daily thereafter ? ?Knox Saliva, PharmD, MPH, BCPS ?Clinical Pharmacist (Rheumatology and Pulmonology) ?

## 2021-08-03 ENCOUNTER — Ambulatory Visit (INDEPENDENT_AMBULATORY_CARE_PROVIDER_SITE_OTHER): Payer: Medicare Other | Admitting: Internal Medicine

## 2021-08-03 DIAGNOSIS — J8489 Other specified interstitial pulmonary diseases: Secondary | ICD-10-CM | POA: Diagnosis not present

## 2021-08-03 DIAGNOSIS — M359 Systemic involvement of connective tissue, unspecified: Secondary | ICD-10-CM

## 2021-08-03 LAB — PULMONARY FUNCTION TEST
DL/VA % pred: 45 %
DL/VA: 1.77 ml/min/mmHg/L
DLCO cor % pred: 14 %
DLCO cor: 3.4 ml/min/mmHg
DLCO unc % pred: 13 %
DLCO unc: 3.22 ml/min/mmHg
FEF 25-75 Pre: 0.68 L/sec
FEF2575-%Pred-Pre: 35 %
FEV1-%Pred-Pre: 38 %
FEV1-Pre: 0.94 L
FEV1FVC-%Pred-Pre: 75 %
FEV6-%Pred-Pre: 48 %
FEV6-Pre: 1.54 L
FEV6FVC-%Pred-Pre: 106 %
FVC-%Pred-Pre: 48 %
FVC-Pre: 1.67 L
Pre FEV1/FVC ratio: 56 %
Pre FEV6/FVC Ratio: 100 %

## 2021-08-03 NOTE — Progress Notes (Signed)
Spirometry and DLCO completed today  ?

## 2021-08-09 ENCOUNTER — Telehealth: Payer: Self-pay | Admitting: Internal Medicine

## 2021-08-09 NOTE — Telephone Encounter (Signed)
Called and spoke with patient. He stated that he has been SOB over the past 3 days, especially with exertion. He is normally on 2L of O2 at rest and 3L of O2 with exertion. He increased his O2 to 4L and this has helped. He has not been able to check his O2 levels at home.  ? ?He denied any fever, body aches or nasal discharge. Also denied being around anyone has been sick recently.  ? ?He has an appt with MR on 08/16/21.  ? ?He wanted to know if he should be doing anything else in regards to the SOB.  ? ?Dr. Melvyn Novas, can you please advise since MR is not available? Thanks!   ?

## 2021-08-09 NOTE — Telephone Encounter (Signed)
Called and spoke with patient. He verbalized understanding. ? ?Nothing further needed at time of call.  ?

## 2021-08-09 NOTE — Telephone Encounter (Signed)
As long as comfortable at rest with acceptable sats on 02 nothing else to suggest over the phone but rec ov and if sats at rest are consistently below 90% on 02 then go to ER  ?

## 2021-08-10 ENCOUNTER — Encounter (HOSPITAL_COMMUNITY): Payer: Self-pay

## 2021-08-10 ENCOUNTER — Emergency Department (HOSPITAL_COMMUNITY): Payer: Medicare Other

## 2021-08-10 ENCOUNTER — Inpatient Hospital Stay (HOSPITAL_COMMUNITY)
Admission: EM | Admit: 2021-08-10 | Discharge: 2021-08-14 | DRG: 196 | Disposition: A | Discharge status: Home / Self Care | Payer: Medicare Other | Attending: Internal Medicine | Admitting: Internal Medicine

## 2021-08-10 ENCOUNTER — Telehealth: Payer: Self-pay | Admitting: Pulmonary Disease

## 2021-08-10 DIAGNOSIS — I130 Hypertensive heart and chronic kidney disease with heart failure and stage 1 through stage 4 chronic kidney disease, or unspecified chronic kidney disease: Secondary | ICD-10-CM | POA: Diagnosis not present

## 2021-08-10 DIAGNOSIS — M059 Rheumatoid arthritis with rheumatoid factor, unspecified: Secondary | ICD-10-CM | POA: Diagnosis not present

## 2021-08-10 DIAGNOSIS — Z7982 Long term (current) use of aspirin: Secondary | ICD-10-CM

## 2021-08-10 DIAGNOSIS — R0602 Shortness of breath: Secondary | ICD-10-CM | POA: Diagnosis not present

## 2021-08-10 DIAGNOSIS — I5033 Acute on chronic diastolic (congestive) heart failure: Secondary | ICD-10-CM | POA: Diagnosis not present

## 2021-08-10 DIAGNOSIS — J9621 Acute and chronic respiratory failure with hypoxia: Secondary | ICD-10-CM | POA: Diagnosis not present

## 2021-08-10 DIAGNOSIS — J189 Pneumonia, unspecified organism: Secondary | ICD-10-CM

## 2021-08-10 DIAGNOSIS — D849 Immunodeficiency, unspecified: Secondary | ICD-10-CM | POA: Diagnosis present

## 2021-08-10 DIAGNOSIS — G4733 Obstructive sleep apnea (adult) (pediatric): Secondary | ICD-10-CM | POA: Diagnosis not present

## 2021-08-10 DIAGNOSIS — K219 Gastro-esophageal reflux disease without esophagitis: Secondary | ICD-10-CM | POA: Diagnosis present

## 2021-08-10 DIAGNOSIS — E875 Hyperkalemia: Secondary | ICD-10-CM | POA: Diagnosis present

## 2021-08-10 DIAGNOSIS — J9601 Acute respiratory failure with hypoxia: Secondary | ICD-10-CM | POA: Diagnosis present

## 2021-08-10 DIAGNOSIS — M0510 Rheumatoid lung disease with rheumatoid arthritis of unspecified site: Secondary | ICD-10-CM | POA: Diagnosis not present

## 2021-08-10 DIAGNOSIS — E860 Dehydration: Secondary | ICD-10-CM | POA: Diagnosis not present

## 2021-08-10 DIAGNOSIS — Z79899 Other long term (current) drug therapy: Secondary | ICD-10-CM | POA: Diagnosis not present

## 2021-08-10 DIAGNOSIS — Z8042 Family history of malignant neoplasm of prostate: Secondary | ICD-10-CM

## 2021-08-10 DIAGNOSIS — Z20822 Contact with and (suspected) exposure to covid-19: Secondary | ICD-10-CM | POA: Diagnosis not present

## 2021-08-10 DIAGNOSIS — J8417 Interstitial lung disease with progressive fibrotic phenotype in diseases classified elsewhere: Secondary | ICD-10-CM | POA: Diagnosis not present

## 2021-08-10 DIAGNOSIS — N1832 Chronic kidney disease, stage 3b: Secondary | ICD-10-CM | POA: Diagnosis not present

## 2021-08-10 DIAGNOSIS — R000 Tachycardia, unspecified: Secondary | ICD-10-CM | POA: Diagnosis not present

## 2021-08-10 DIAGNOSIS — J441 Chronic obstructive pulmonary disease with (acute) exacerbation: Principal | ICD-10-CM

## 2021-08-10 DIAGNOSIS — I10 Essential (primary) hypertension: Secondary | ICD-10-CM | POA: Diagnosis present

## 2021-08-10 DIAGNOSIS — R0902 Hypoxemia: Secondary | ICD-10-CM | POA: Diagnosis not present

## 2021-08-10 DIAGNOSIS — M069 Rheumatoid arthritis, unspecified: Secondary | ICD-10-CM | POA: Diagnosis present

## 2021-08-10 DIAGNOSIS — J8410 Pulmonary fibrosis, unspecified: Secondary | ICD-10-CM | POA: Diagnosis not present

## 2021-08-10 DIAGNOSIS — J849 Interstitial pulmonary disease, unspecified: Secondary | ICD-10-CM | POA: Diagnosis present

## 2021-08-10 HISTORY — DX: Obstructive sleep apnea (adult) (pediatric): G47.33

## 2021-08-10 HISTORY — DX: Interstitial pulmonary disease, unspecified: J84.9

## 2021-08-10 HISTORY — DX: Dependence on other enabling machines and devices: Z99.89

## 2021-08-10 LAB — COMPREHENSIVE METABOLIC PANEL
ALT: 14 U/L (ref 0–44)
AST: 25 U/L (ref 15–41)
Albumin: 3.2 g/dL — ABNORMAL LOW (ref 3.5–5.0)
Alkaline Phosphatase: 45 U/L (ref 38–126)
Anion gap: 14 (ref 5–15)
BUN: 27 mg/dL — ABNORMAL HIGH (ref 8–23)
CO2: 25 mmol/L (ref 22–32)
Calcium: 9.7 mg/dL (ref 8.9–10.3)
Chloride: 98 mmol/L (ref 98–111)
Creatinine, Ser: 1.45 mg/dL — ABNORMAL HIGH (ref 0.61–1.24)
GFR, Estimated: 48 mL/min — ABNORMAL LOW (ref >60)
Glucose, Bld: 103 mg/dL — ABNORMAL HIGH (ref 70–99)
Potassium: 5.2 mmol/L — ABNORMAL HIGH (ref 3.5–5.1)
Sodium: 137 mmol/L (ref 135–145)
Total Bilirubin: 0.8 mg/dL (ref 0.3–1.2)
Total Protein: 7.6 g/dL (ref 6.5–8.1)

## 2021-08-10 LAB — CBC
HCT: 38.4 % — ABNORMAL LOW (ref 39.0–52.0)
Hemoglobin: 12.4 g/dL — ABNORMAL LOW (ref 13.0–17.0)
MCH: 30.2 pg (ref 26.0–34.0)
MCHC: 32.3 g/dL (ref 30.0–36.0)
MCV: 93.7 fL (ref 80.0–100.0)
Platelets: 264 10*3/uL (ref 150–400)
RBC: 4.1 MIL/uL — ABNORMAL LOW (ref 4.22–5.81)
RDW: 14.8 % (ref 11.5–15.5)
WBC: 10.9 10*3/uL — ABNORMAL HIGH (ref 4.0–10.5)
nRBC: 0 % (ref 0.0–0.2)

## 2021-08-10 LAB — TROPONIN I (HIGH SENSITIVITY)
Troponin I (High Sensitivity): 10 ng/L (ref <18)
Troponin I (High Sensitivity): 7 ng/L (ref <18)

## 2021-08-10 MED ORDER — ALBUTEROL SULFATE (2.5 MG/3ML) 0.083% IN NEBU: 2.5000 mg | INHALATION_SOLUTION | RESPIRATORY_TRACT | Status: DC | PRN

## 2021-08-10 MED ORDER — SODIUM CHLORIDE 0.9 % IV SOLN
2.0000 g | Freq: Once | INTRAVENOUS | Status: AC
  Administered 2021-08-10: 2 g via INTRAVENOUS
  Filled 2021-08-10: qty 20

## 2021-08-10 MED ORDER — ENOXAPARIN SODIUM 40 MG/0.4ML IJ SOSY
40.0000 mg | PREFILLED_SYRINGE | INTRAMUSCULAR | Status: DC
  Administered 2021-08-10 – 2021-08-13 (×4): 40 mg via SUBCUTANEOUS
  Filled 2021-08-10 (×4): qty 0.4

## 2021-08-10 MED ORDER — SODIUM CHLORIDE 0.9 % IV SOLN
500.0000 mg | INTRAVENOUS | Status: DC
  Administered 2021-08-10: 500 mg via INTRAVENOUS
  Filled 2021-08-10: qty 5

## 2021-08-10 MED ORDER — METHYLPREDNISOLONE SODIUM SUCC 125 MG IJ SOLR
125.0000 mg | Freq: Once | INTRAMUSCULAR | Status: AC
  Administered 2021-08-10: 125 mg via INTRAVENOUS
  Filled 2021-08-10: qty 2

## 2021-08-10 MED ORDER — ONDANSETRON HCL 4 MG PO TABS: 4.0000 mg | ORAL_TABLET | Freq: Four times a day (QID) | ORAL | Status: DC | PRN

## 2021-08-10 MED ORDER — SODIUM CHLORIDE 0.9 % IV SOLN
2.0000 g | INTRAVENOUS | Status: DC
  Administered 2021-08-11 – 2021-08-13 (×3): 2 g via INTRAVENOUS
  Filled 2021-08-10 (×3): qty 20

## 2021-08-10 MED ORDER — NINTEDANIB ESYLATE 150 MG PO CAPS
150.0000 mg | ORAL_CAPSULE | Freq: Every day | ORAL | Status: DC
  Filled 2021-08-10: qty 1

## 2021-08-10 MED ORDER — ACETAMINOPHEN 650 MG RE SUPP: 650.0000 mg | Freq: Four times a day (QID) | RECTAL | Status: DC | PRN

## 2021-08-10 MED ORDER — SENNOSIDES-DOCUSATE SODIUM 8.6-50 MG PO TABS: 1.0000 | ORAL_TABLET | Freq: Every evening | ORAL | Status: DC | PRN

## 2021-08-10 MED ORDER — MONTELUKAST SODIUM 10 MG PO TABS
10.0000 mg | ORAL_TABLET | Freq: Every day | ORAL | Status: DC
  Administered 2021-08-10 – 2021-08-13 (×4): 10 mg via ORAL
  Filled 2021-08-10 (×4): qty 1

## 2021-08-10 MED ORDER — IPRATROPIUM-ALBUTEROL 0.5-2.5 (3) MG/3ML IN SOLN
3.0000 mL | Freq: Once | RESPIRATORY_TRACT | Status: AC
  Administered 2021-08-10: 3 mL via RESPIRATORY_TRACT
  Filled 2021-08-10: qty 3

## 2021-08-10 MED ORDER — GUAIFENESIN ER 600 MG PO TB12
600.0000 mg | ORAL_TABLET | Freq: Two times a day (BID) | ORAL | Status: DC
  Administered 2021-08-10 – 2021-08-14 (×8): 600 mg via ORAL
  Filled 2021-08-10 (×8): qty 1

## 2021-08-10 MED ORDER — METHYLPREDNISOLONE SODIUM SUCC 40 MG IJ SOLR
40.0000 mg | Freq: Two times a day (BID) | INTRAMUSCULAR | Status: DC
  Administered 2021-08-11 – 2021-08-13 (×5): 40 mg via INTRAVENOUS
  Filled 2021-08-10 (×5): qty 1

## 2021-08-10 MED ORDER — ACETAMINOPHEN 325 MG PO TABS
650.0000 mg | ORAL_TABLET | Freq: Four times a day (QID) | ORAL | Status: DC | PRN
Start: 2021-08-10 — End: 2021-08-14

## 2021-08-10 MED ORDER — ONDANSETRON HCL 4 MG/2ML IJ SOLN
4.0000 mg | Freq: Four times a day (QID) | INTRAMUSCULAR | Status: DC | PRN
Start: 2021-08-10 — End: 2021-08-14

## 2021-08-10 MED ORDER — ATORVASTATIN CALCIUM 10 MG PO TABS
10.0000 mg | ORAL_TABLET | Freq: Every day | ORAL | Status: DC
  Administered 2021-08-11 – 2021-08-14 (×4): 10 mg via ORAL
  Filled 2021-08-10 (×4): qty 1

## 2021-08-10 MED ORDER — IPRATROPIUM-ALBUTEROL 0.5-2.5 (3) MG/3ML IN SOLN
3.0000 mL | Freq: Four times a day (QID) | RESPIRATORY_TRACT | Status: DC
  Administered 2021-08-11 – 2021-08-13 (×10): 3 mL via RESPIRATORY_TRACT
  Filled 2021-08-10 (×10): qty 3

## 2021-08-10 MED ORDER — AMLODIPINE BESYLATE 10 MG PO TABS
10.0000 mg | ORAL_TABLET | Freq: Every day | ORAL | Status: DC
  Administered 2021-08-11 – 2021-08-14 (×4): 10 mg via ORAL
  Filled 2021-08-10 (×4): qty 1

## 2021-08-10 MED ORDER — SODIUM ZIRCONIUM CYCLOSILICATE 10 G PO PACK
10.0000 g | PACK | Freq: Once | ORAL | Status: AC
  Administered 2021-08-10: 10 g via ORAL
  Filled 2021-08-10: qty 1

## 2021-08-10 MED ORDER — PANTOPRAZOLE SODIUM 40 MG PO TBEC
40.0000 mg | DELAYED_RELEASE_TABLET | Freq: Every day | ORAL | Status: DC
Start: 2021-08-11 — End: 2021-08-14
  Administered 2021-08-11 – 2021-08-14 (×4): 40 mg via ORAL
  Filled 2021-08-10 (×4): qty 1

## 2021-08-10 NOTE — ED Notes (Signed)
Updated pt and family at bedside on plan of care ?

## 2021-08-10 NOTE — H&P (Signed)
?History and Physical  ? ? ?[name] OEU:[id number] DOB: March 30, 1941 DOA: 08/10/2021 ? ?PCP: [name], MD  ?Patient coming from: Home via EMS ? ?I have personally briefly reviewed patient's old medical records in [place] ? ?Chief Complaint: Dyspnea and hypoxia ? ?HPI: ?[name] is a 81 y.o. male with medical history significant for interstitial lung disease/UIP, chronic respiratory failure with hypoxia on 2 L O2 via [place], rheumatoid arthritis on Imuran, CKD stage IIIb, HTN, GERD, OSA on CPAP who presented to the ED for evaluation of dyspnea with hypoxia. ? ?Patient recently admitted 06/22/2021-06/25/2021 for acute hypoxic respiratory failure secondary to ILD exacerbation with possible superimposed pneumonia.  He was treated with steroids and antibiotics.  He was discharged with prednisone taper and new 2 L O2 via [place] requirement. ? ?Patient states he was doing fairly well for a little while.  He established with the ILD clinic, Dr. Chase Caller, on 3/3.  During that visit he was felt to have another exacerbation of his ILD and prescribed a prednisone taper which he has completed.  He was also prescribed Ofev to start 1 tablet daily for 1 week then twice daily.  Patient states he just received and started Ofev on 4/13.  He was previously taking methotrexate chronically for management of his rheumatoid arthritis which was recently switched to Imuran per advice of his pulmonologist. ? ?Patient states over the last couple weeks he has been having worsening exertional dyspnea.  He has had cough occasionally productive of yellow sputum.  He has been having to increase his supplemental O2 from 2 L to 4 L via [place].  He states that he has not been using his CPAP at night since he was started on nasal cannula oxygen as he was not sure if he could use both. ? ?He says he is checking his oxygen at home.  Today when he was ambulating he saw that his SPO2 dropped to 67% while on 4 L via [place].  He called his pulmonology  clinic who advised that he come to the ED for further evaluation. ? ?He otherwise denies any chest pain, subjective fevers, chills, diaphoresis, nausea, vomiting, abdominal pain, dysuria, diarrhea, lower extremity edema. ? ?ED Course  Labs/Imaging on admission: I have personally reviewed following labs and imaging studies. ? ?Initial vitals showed BP 126/61, pulse 98, RR 25, temp 98.2 ?F, SPO2 98% on 3 L O2 via [place].  While in the ED, SPO2 dropped to 86% when standing while on 4 L O2 via [place]. ? ?Labs show WBC 10.9, hemoglobin 12.4, platelets 264,000, sodium 137, potassium 5.2, bicarb 25, BUN 27, creatinine 1.45, serum glucose 103, LFTs within normal limits, troponin 10.  Respiratory panel in process. ? ?Portable chest x-ray showed persistent pulmonary fibrosis findings with interval increase in patchy bilateral groundglass consolidation, most pronounced within the right upper lobe. ? ?Patient was given IV Solu-Medrol 125 mg, IV ceftriaxone and azithromycin, DuoNeb treatment.  The hospitalist service was consulted to admit for further evaluation and management. ? ?Review of Systems: All systems reviewed and are negative except as documented in history of present illness above. ? ? ?Past Medical History:  ?Diagnosis Date  ? Adenomatous colon polyp   ? Collapsed lung 04/2018  ? Diverticulosis   ? GERD (gastroesophageal reflux disease)   ? Hemorrhoids   ? internal and external  ? Hiatal hernia   ? Hypertension   ? Pneumonia 11/2019  ? Rheumatoid arthritis ([place])   ? S/P dilatation of esophageal stricture   ? ? ?  Past Surgical History:  ?Procedure Laterality Date  ? CIRCUMCISION    ? HEMORRHOID SURGERY  2002  ? PLEURADESIS Right 06/14/2019  ? Procedure: PLEURADESIS USING TALC;  Surgeon: Prescott Gum, Collier Salina, MD;  Location: [facility];  Service: Thoracic;  Laterality: Right;  ? STAPLING OF BLEBS Right 06/14/2019  ? Procedure: STAPLING OF BLEBS;  Surgeon: [name], MD;  Location: [facility];  Service: Thoracic;  Laterality: Right;  ?  VIDEO ASSISTED THORACOSCOPY Right 06/14/2019  ? Procedure: VIDEO ASSISTED THORACOSCOPY WITH RIGHT UPPER LOBE WEDGE;  Surgeon: [name], MD;  Location: [facility];  Service: Thoracic;  Laterality: Right;  ? ? ?Social History: ? reports that he has never smoked. He has never used smokeless tobacco. He reports that he does not drink alcohol and does not use drugs. ? ?No Known Allergies ? ?Family History  ?Problem Relation Age of Onset  ? Prostate cancer Paternal Uncle   ? Colon cancer Neg Hx   ? Throat cancer Neg Hx   ? Diabetes Neg Hx   ? Kidney disease Neg Hx   ? Liver disease Neg Hx   ? ? ? ?Prior to Admission medications   ?Medication Sig Start Date End Date Taking? Authorizing Provider  ?acetaminophen (TYLENOL) 500 MG tablet Take 2 tablets (1,000 mg total) by mouth every 6 (six) hours. ?Patient taking differently: Take 1,000 mg by mouth every 6 (six) hours as needed. 06/18/19   Elgie Collard, PA-C  ?amLODipine (NORVASC) 10 MG tablet Take 1 tablet (10 mg total) by mouth daily. 11/08/15   Thurnell Lose, MD  ?aspirin EC 81 MG tablet Take 81 mg by mouth daily.    [provider]  ?atorvastatin (LIPITOR) 10 MG tablet Take 10 mg by mouth daily. 06/09/21   [provider]  ?cetirizine (ZYRTEC) 10 MG tablet Take 1 tablet (10 mg total) by mouth 2 (two) times daily as needed for allergies (Can take an extra dose during flare ups). Take 1 tablet 1-2 times daily as needed. 03/20/21   Kozlow, Donnamarie Poag, MD  ?FARXIGA 10 MG TABS tablet Take 10 mg by mouth daily. 07/01/19   [provider]  ?fluticasone (FLONASE) 50 MCG/ACT nasal spray Place 2 sprays into both nostrils daily. 03/20/21   Kozlow, Donnamarie Poag, MD  ?FOLIC ACID PO Take 1 tablet by mouth daily.    [provider]  ?guaiFENesin (MUCINEX) 600 MG 12 hr tablet Take 1 tablet (600 mg total) by mouth 2 (two) times daily. 06/25/21   Regalado, Belkys A, MD  ?Menthol-Methyl Salicylate (MUSCLE RUB) 10-15 % CREA Apply 1 application topically as needed  for muscle pain (shoulder/neck).    [provider]  ?methotrexate (RHEUMATREX) 2.5 MG tablet Take 10 mg by mouth See admin instructions. Take '10mg'$  every Thursday and Friday    [provider]  ?montelukast (SINGULAIR) 10 MG tablet Take 1 tablet (10 mg total) by mouth at bedtime. 03/20/21   Kozlow, Donnamarie Poag, MD  ?Nintedanib (OFEV) 150 MG CAPS Take 1 capsule by mouth once daily for one week then increase to 1 capsule by mouth twice daily thereafter. 08/02/21   Brand Males, MD  ?Olopatadine HCl (PATADAY) 0.2 % SOLN Place 1 drop into both eyes daily. Use 1 drop in each eye once daily as needed. 03/20/21   Kozlow, Donnamarie Poag, MD  ?omeprazole (PRILOSEC) 20 MG capsule TAKE 1 CAPSULE BY MOUTH EVERY DAY ?Patient taking differently: Take 20 mg by mouth daily. 02/16/21   Henrene Pastor,  Docia Chuck, MD  ? ? ?Physical Exam: ?Vitals:  ? 08/10/21 1715 08/10/21 1800 08/10/21 1933 08/10/21 2000  ?BP: 126/61 (!) 155/75 (!) 156/78   ?Pulse: 98 100 (!) 103 94  ?Resp: (!) 25 (!) 23 (!) 25 (!) 27  ?Temp:      ?TempSrc:      ?SpO2: 98% 98% 93% 95%  ?Weight:      ?Height:      ? ?Constitutional: Resting in bed, NAD, calm, comfortable ?Eyes: PERRL, lids and conjunctivae normal ?ENMT: Mucous membranes are moist. Posterior pharynx clear of any exudate or lesions.Normal dentition.  ?Neck: normal, supple, no masses. ?Respiratory: Distant breath sounds with fine inspiratory crackles throughout.  Increased respiratory effort. No accessory muscle use.  ?Cardiovascular: Regular rate and rhythm, no murmurs / rubs / gallops. No extremity edema. 2+ pedal pulses. ?Abdomen: no tenderness, no masses palpated. No hepatosplenomegaly. Bowel sounds positive.  ?Musculoskeletal: no clubbing / cyanosis. No joint deformity upper and lower extremities. Good ROM, no contractures. Normal muscle tone.  ?Skin: no rashes, lesions, ulcers. No induration ?Neurologic: Sensation intact. Strength 5/5 in all 4.  ?Psychiatric: Normal judgment and insight. Alert and  oriented x 3. Normal mood.  ? ?EKG: Personally reviewed. Sinus rhythm, rate 101.  Rate is faster when compared to prior. ? ?Assessment/Plan ?Principal Problem: ?  Acute on chronic respiratory failure with hy

## 2021-08-10 NOTE — ED Triage Notes (Signed)
Hx of COPD and on chronic O2 at 3LPM [place]. EMS reports O2 dropped in the 80s today. Nonrebreather initiated. O2 now at 90-95%. Pt reports sob on exertion. Alert and oriented x 4.  ?

## 2021-08-10 NOTE — Assessment & Plan Note (Signed)
Give Lokelma once tonight, repeat labs in AM. ?

## 2021-08-10 NOTE — Hospital Course (Signed)
[name] is a 81 y.o. male with medical history significant for interstitial lung disease/UIP, chronic respiratory failure with hypoxia on 2 L O2 via [place], rheumatoid arthritis on Imuran, CKD stage IIIb, HTN, GERD, OSA on CPAP who is admitted with acute on chronic hypoxic respiratory failure due to ILD exacerbation. ?

## 2021-08-10 NOTE — Assessment & Plan Note (Signed)
Continue CPAP nightly.  Patient states he has not been using recently since started on supplemental O2 via [place]. ?

## 2021-08-10 NOTE — ED Provider Notes (Signed)
?[facility] ?Provider Note ? ? ?CSN: [id number] ?Arrival date & time: 08/10/21  1650 ? ?  ? ?History ? ?Chief Complaint  ?Patient presents with  ? Shortness of Breath  ? ? ?[name] is a 81 y.o. male. ? ?Pt complains of increased shortness of breath. Pt has had to increase his 02 from 2 to 4 liters.  Pt reports oxygen drops to the 60's when he walks to the bathroom.  Pt reports he has been on oxygen since February.  ? ?The history is provided by the patient. No language interpreter was used.  ?Shortness of Breath ?Severity:  Moderate ?Onset quality:  Sudden ?Duration:  2 days ?Timing:  Constant ?Progression:  Worsening ?Chronicity:  New ?Context: URI   ?Relieved by:  Nothing ?Worsened by:  Nothing ?Ineffective treatments:  None tried ?Associated symptoms: sore throat   ?Associated symptoms: no abdominal pain   ?Risk factors: no recent alcohol use and no tobacco use   ? ?  ? ?Home Medications ?Prior to Admission medications   ?Medication Sig Start Date End Date Taking? Authorizing Provider  ?acetaminophen (TYLENOL) 500 MG tablet Take 2 tablets (1,000 mg total) by mouth every 6 (six) hours. ?Patient taking differently: Take 1,000 mg by mouth every 6 (six) hours as needed. 06/18/19   Elgie Collard, PA-C  ?amLODipine (NORVASC) 10 MG tablet Take 1 tablet (10 mg total) by mouth daily. 11/08/15   Thurnell Lose, MD  ?aspirin EC 81 MG tablet Take 81 mg by mouth daily.    [provider]  ?atorvastatin (LIPITOR) 10 MG tablet Take 10 mg by mouth daily. 06/09/21   [provider]  ?cetirizine (ZYRTEC) 10 MG tablet Take 1 tablet (10 mg total) by mouth 2 (two) times daily as needed for allergies (Can take an extra dose during flare ups). Take 1 tablet 1-2 times daily as needed. 03/20/21   Kozlow, Donnamarie Poag, MD  ?FARXIGA 10 MG TABS tablet Take 10 mg by mouth daily. 07/01/19   [provider]  ?fluticasone (FLONASE) 50 MCG/ACT nasal spray Place 2 sprays into both  nostrils daily. 03/20/21   Kozlow, Donnamarie Poag, MD  ?FOLIC ACID PO Take 1 tablet by mouth daily.    [provider]  ?guaiFENesin (MUCINEX) 600 MG 12 hr tablet Take 1 tablet (600 mg total) by mouth 2 (two) times daily. 06/25/21   Regalado, Belkys A, MD  ?Menthol-Methyl Salicylate (MUSCLE RUB) 10-15 % CREA Apply 1 application topically as needed for muscle pain (shoulder/neck).    [provider]  ?methotrexate (RHEUMATREX) 2.5 MG tablet Take 10 mg by mouth See admin instructions. Take '10mg'$  every Thursday and Friday    [provider]  ?montelukast (SINGULAIR) 10 MG tablet Take 1 tablet (10 mg total) by mouth at bedtime. 03/20/21   Kozlow, Donnamarie Poag, MD  ?Nintedanib (OFEV) 150 MG CAPS Take 1 capsule by mouth once daily for one week then increase to 1 capsule by mouth twice daily thereafter. 08/02/21   Brand Males, MD  ?Olopatadine HCl (PATADAY) 0.2 % SOLN Place 1 drop into both eyes daily. Use 1 drop in each eye once daily as needed. 03/20/21   Kozlow, Donnamarie Poag, MD  ?omeprazole (PRILOSEC) 20 MG capsule TAKE 1 CAPSULE BY MOUTH EVERY DAY ?Patient taking differently: Take 20 mg by mouth daily. 02/16/21   Irene Shipper, MD  ?   ? ?Allergies    ?Patient has no known allergies.   ? ?Review of  Systems   ?Review of Systems  ?HENT:  Positive for sore throat.   ?Respiratory:  Positive for shortness of breath.   ?Gastrointestinal:  Negative for abdominal pain.  ?All other systems reviewed and are negative. ? ?Physical Exam ?Updated Vital Signs ?BP (!) 156/78   Pulse (!) 103   Temp 98.2 ?F (36.8 ?C) (Oral)   Resp (!) 25   Ht '5\' 8"'$  (1.727 m)   Wt 90.3 kg   SpO2 93%   BMI 30.26 kg/m?  ?Physical Exam ?Vitals and nursing note reviewed.  ?Constitutional:   ?   General: He is not in acute distress. ?   Appearance: He is well-developed.  ?HENT:  ?   Head: Normocephalic and atraumatic.  ?Eyes:  ?   Conjunctiva/sclera: Conjunctivae normal.  ?Cardiovascular:  ?   Rate and Rhythm: Normal rate and regular rhythm.   ?   Heart sounds: No murmur heard. ?Pulmonary:  ?   Effort: Tachypnea present. No respiratory distress.  ?   Breath sounds: Decreased breath sounds present.  ?Chest:  ?   Chest wall: No tenderness.  ?Abdominal:  ?   Palpations: Abdomen is soft.  ?   Tenderness: There is no abdominal tenderness.  ?Musculoskeletal:     ?   General: No swelling.  ?   Cervical back: Neck supple.  ?Skin: ?   General: Skin is warm and dry.  ?   Capillary Refill: Capillary refill takes less than 2 seconds.  ?Neurological:  ?   Mental Status: He is alert.  ?Psychiatric:     ?   Mood and Affect: Mood normal.  ? ? ?ED Results / Procedures / Treatments   ?Labs ?(all labs ordered are listed, but only abnormal results are displayed) ?Labs Reviewed  ?CBC - Abnormal; Notable for the following components:  ?    Result Value  ? WBC 10.9 (*)   ? RBC 4.10 (*)   ? Hemoglobin 12.4 (*)   ? HCT 38.4 (*)   ? All other components within normal limits  ?COMPREHENSIVE METABOLIC PANEL - Abnormal; Notable for the following components:  ? Potassium 5.2 (*)   ? Glucose, Bld 103 (*)   ? BUN 27 (*)   ? Creatinine, Ser 1.45 (*)   ? Albumin 3.2 (*)   ? GFR, Estimated 48 (*)   ? All other components within normal limits  ?TROPONIN I (HIGH SENSITIVITY)  ?TROPONIN I (HIGH SENSITIVITY)  ? ? ?EKG ?None ? ?Radiology ?DG Chest Port 1 View ? ?Result Date: 08/10/2021 ?CLINICAL DATA:  Shortness of breath EXAM: PORTABLE CHEST 1 VIEW COMPARISON:  06/22/2021 FINDINGS: Single frontal view of the chest demonstrates a stable cardiac silhouette. Persistent findings of pulmonary fibrosis. There is patchy multifocal ground-glass consolidation, most pronounced in the right upper lobe. Favor edema or infection superimposed upon background scarring. No effusion or pneumothorax. No acute bony abnormalities. IMPRESSION: 1. Interval increase in patchy bilateral ground-glass consolidation, most pronounced within the right upper lobe. Favor edema or infection superimposed upon chronic  background scarring and fibrosis. Electronically Signed   By: Randa Ngo M.D.   On: 08/10/2021 19:00   ? ?Procedures ?Procedures  ? ? ?Medications Ordered in ED ?Medications  ?cefTRIAXone (ROCEPHIN) 2 g in sodium chloride 0.9 % 100 mL IVPB (2 g Intravenous New Bag/Given 08/10/21 1925)  ?azithromycin (ZITHROMAX) 500 mg in sodium chloride 0.9 % 250 mL IVPB (has no administration in time range)  ?ipratropium-albuterol (DUONEB) 0.5-2.5 (3) MG/3ML nebulizer solution 3 mL (3  mLs Nebulization Given 08/10/21 1834)  ?methylPREDNISolone sodium succinate (SOLU-MEDROL) 125 mg/2 mL injection 125 mg (125 mg Intravenous Given 08/10/21 1924)  ? ? ?ED Course/ Medical Decision Making/ A&P ?  ?                        ?Medical Decision Making ?Pt has severe interstitial lung disease and a history of rheumatoid arthritis  ? ?Problems Addressed: ?COPD exacerbation ([place]): acute illness or injury ?   Details: Pt reports increasing shortness of breath and having to increase his 02 ? ?Amount and/or Complexity of Data Reviewed ?Independent Historian: spouse ?   Details: Pt;s wife reports pt becomes short of breath when going to the bathroom ?External Data Reviewed: notes. ?   Details: Note from Pulmonary MD reviewed.  Hospitalist notes reviewed form last admission ?Labs: ordered. Decision-making details documented in ED Course. ?   Details: Labs ordered, reviewed and interpreted,  Pt has an elevated wbc count of 10.9   Potasium is 5.2 ?Radiology: ordered and independent interpretation performed. Decision-making details documented in ED Course. ?   Details: Chest xray shows increaed patchy bilat ground glass opacities ?Discussion of management or test interpretation with external provider(s): Hospitalist consults and will see for admission ? ?Risk ?Decision regarding hospitalization. ? ? ? ? ? ? ? ? ? ? ?Final Clinical Impression(s) / ED Diagnoses ?Final diagnoses:  ?COPD exacerbation ([place])  ?Community acquired pneumonia, unspecified  laterality  ? ? ?Rx / DC Orders ?ED Discharge Orders   ? ? None  ? ?  ? ? ?  ?Fransico Meadow, [place] ?08/10/21 2028 ? ?  ?Lucrezia Starch, MD ?08/11/21 1902 ? ?

## 2021-08-10 NOTE — Assessment & Plan Note (Signed)
Recently switched from chronic methotrexate to Imuran.  Hold for now. ?

## 2021-08-10 NOTE — ED Notes (Signed)
At 2100 while pt was standing at bedside to use urinal his SPO2 dropped to 76% and pt appeared to be in significant respiratory distress, assisted pt to sit on side of bed and turned oxygen up to 7L and provided coaching about proper breathing, pt still was working hard to breath 40-50 breaths per minute and saturation was slow to come up initially, at this time page was put out to both RT and MD, Dr. Posey Pronto quickly returned call and by that time pts saturation was raising into the mid 80s and distress seemed to be easing, updated Dr. Posey Pronto on the situation and he put in additional PRN orders. By 2108 pts SPO2 was up to 92% and had an easier work of breathing. Turned O2 down to 5L pt tolerating well. At this time pts breathing is easy, even, unlabored on 4L O2 with SPO2 of 97% ?

## 2021-08-10 NOTE — Assessment & Plan Note (Signed)
Stable, continue to monitor  ?

## 2021-08-10 NOTE — Telephone Encounter (Signed)
Called and spoke with pt's spouse as well as pt who stated that pt's sats have dropped to 67% on his oxygen. States that this has happened more than once but states that it does not stay that low for long. ? ?Pt's spouse stated when it does drop, even though it does not stay low for long, when sats are going back up, pt does act like he is struggling to get a breath. Stated to her if pt's sats keep dropping that low that pt needs to be evaluated at ED and they both verbalized understanding. Nothing further needed. ?

## 2021-08-10 NOTE — Assessment & Plan Note (Signed)
Continue home dose amlodipine. ?

## 2021-08-10 NOTE — ED Notes (Signed)
Pt stood up to use the urinal at bedside. Became very short of breath. O2 dropped to 86% on the monitor and tachypneic at 46cpm. Pt is currently at 5LPM [place]. Will continue to monitor.  ?

## 2021-08-10 NOTE — ED Notes (Signed)
Pt is now back to 3LPM [place] at 97% on the monitor.  ?

## 2021-08-10 NOTE — Assessment & Plan Note (Addendum)
ILD exacerbation ?Presenting with worsening exertional dyspnea, increased O2 requirement from 2 L via [place] to 4 L via [place].  SPO2 dropped to 86% when standing while on 4 L O2 via [place].  CXR shows acute on chronic ILD/pulmonary fibrosis changes.  Lower suspicion for superimposed bacterial pneumonia at this time however given immunosuppressed status will continue empiric antibiotics.  He received 1 dose of ceftriaxone and azithromycin so far. ?-Continue IV Solu-Medrol 40 mg twice daily ?-Continue IV ceftriaxone and azithromycin ?-Start on scheduled DuoNebs with albuterol as needed ?-BiPAP as needed ?-Continue supplemental oxygen and wean as able ?-Continue home dose Ofev (just started 4/13) once daily x1wk then BID ?

## 2021-08-11 ENCOUNTER — Encounter (HOSPITAL_COMMUNITY): Payer: Self-pay | Admitting: Internal Medicine

## 2021-08-11 DIAGNOSIS — J9621 Acute and chronic respiratory failure with hypoxia: Secondary | ICD-10-CM

## 2021-08-11 DIAGNOSIS — J9601 Acute respiratory failure with hypoxia: Secondary | ICD-10-CM

## 2021-08-11 DIAGNOSIS — J8417 Interstitial lung disease with progressive fibrotic phenotype in diseases classified elsewhere: Secondary | ICD-10-CM | POA: Diagnosis not present

## 2021-08-11 LAB — CBC
HCT: 38 % — ABNORMAL LOW (ref 39.0–52.0)
Hemoglobin: 12.8 g/dL — ABNORMAL LOW (ref 13.0–17.0)
MCH: 30.7 pg (ref 26.0–34.0)
MCHC: 33.7 g/dL (ref 30.0–36.0)
MCV: 91.1 fL (ref 80.0–100.0)
Platelets: 258 10*3/uL (ref 150–400)
RBC: 4.17 MIL/uL — ABNORMAL LOW (ref 4.22–5.81)
RDW: 14.6 % (ref 11.5–15.5)
WBC: 9.8 10*3/uL (ref 4.0–10.5)
nRBC: 0 % (ref 0.0–0.2)

## 2021-08-11 LAB — BASIC METABOLIC PANEL
Anion gap: 9 (ref 5–15)
BUN: 26 mg/dL — ABNORMAL HIGH (ref 8–23)
CO2: 24 mmol/L (ref 22–32)
Calcium: 9.2 mg/dL (ref 8.9–10.3)
Chloride: 103 mmol/L (ref 98–111)
Creatinine, Ser: 1.4 mg/dL — ABNORMAL HIGH (ref 0.61–1.24)
GFR, Estimated: 50 mL/min — ABNORMAL LOW (ref >60)
Glucose, Bld: 178 mg/dL — ABNORMAL HIGH (ref 70–99)
Potassium: 4.7 mmol/L (ref 3.5–5.1)
Sodium: 136 mmol/L (ref 135–145)

## 2021-08-11 LAB — RESPIRATORY PANEL BY PCR

## 2021-08-11 LAB — C-REACTIVE PROTEIN: CRP: 17.1 mg/dL — ABNORMAL HIGH (ref <1.0)

## 2021-08-11 LAB — PROCALCITONIN: Procalcitonin: <0.1 ng/mL

## 2021-08-11 LAB — RESP PANEL BY RT-PCR (FLU A&B, COVID) ARPGX2
Influenza A by PCR: NEGATIVE
Influenza B by PCR: NEGATIVE
SARS Coronavirus 2 by RT PCR: NEGATIVE

## 2021-08-11 LAB — SEDIMENTATION RATE: Sed Rate: 94 mm/hr — ABNORMAL HIGH (ref 0–16)

## 2021-08-11 MED ORDER — NINTEDANIB ESYLATE 150 MG PO CAPS: 150.0000 mg | ORAL_CAPSULE | Freq: Two times a day (BID) | ORAL | Status: DC

## 2021-08-11 MED ORDER — MENTHOL 3 MG MT LOZG
1.0000 | LOZENGE | OROMUCOSAL | Status: DC | PRN
  Filled 2021-08-11 (×2): qty 9

## 2021-08-11 MED ORDER — AZITHROMYCIN 500 MG PO TABS
500.0000 mg | ORAL_TABLET | Freq: Every day | ORAL | Status: DC
  Administered 2021-08-11 – 2021-08-13 (×3): 500 mg via ORAL
  Filled 2021-08-11 (×3): qty 1

## 2021-08-11 MED ORDER — NINTEDANIB ESYLATE 150 MG PO CAPS
150.0000 mg | ORAL_CAPSULE | Freq: Every day | ORAL | Status: DC
  Administered 2021-08-12: 150 mg via ORAL
  Filled 2021-08-11 (×2): qty 1

## 2021-08-11 MED ORDER — FLUTICASONE PROPIONATE 50 MCG/ACT NA SUSP
2.0000 | Freq: Every day | NASAL | Status: DC
  Administered 2021-08-11 – 2021-08-14 (×4): 2 via NASAL
  Filled 2021-08-11: qty 16

## 2021-08-11 MED ORDER — FUROSEMIDE 10 MG/ML IJ SOLN
40.0000 mg | Freq: Once | INTRAMUSCULAR | Status: AC
  Administered 2021-08-11: 40 mg via INTRAVENOUS
  Filled 2021-08-11: qty 4

## 2021-08-11 NOTE — Progress Notes (Signed)
Pt's home medication Ofev has been taken down to pharmacy. Pharmacy asked for order to be placed. MD made aware.  ?

## 2021-08-11 NOTE — Evaluation (Signed)
Occupational Therapy Evaluation ?Patient Details ?Name: [name] ?MRN: [id number] ?DOB: 01/28/1941 ?Today's Date: 08/11/2021 ? ? ?History of Present Illness 81 y/o male presented to ED on 08/10/21 for low spO2 and SOB on exertion. CXR showed persistent pulmonary fibrosis. Admitted for acute on chronic respiratory failure/COPD exacerbation. PMH: COPD on 2-3L, RA, CKD stage IIIb, HTN, OSA  ? ?Clinical Impression ?  ?Pt admitted for concerns listed above. PTA pt reported that he was independent with all ADL's and IADL's, using a RW when needed. At this time, pt is limited by his O2 needs. Overall functionally, pt requiring no physical assist, he is able to complete al BADL's at his baseline level. OT provided pt with energy conservation education and techniques, that he can utilize at home. He does not need further OT services at this time and acute OT will sign off.   ?   ? ?Recommendations for follow up therapy are one component of a multi-disciplinary discharge planning process, led by the attending physician.  Recommendations may be updated based on patient status, additional functional criteria and insurance authorization.  ? ?Follow Up Recommendations ? No OT follow up  ?  ?Assistance Recommended at Discharge PRN  ?Patient can return home with the following   ? ?  ?Functional Status Assessment ? Patient has had a recent decline in their functional status and demonstrates the ability to make significant improvements in function in a reasonable and predictable amount of time.  ?Equipment Recommendations ? Tub/shower seat  ?  ?Recommendations for Other Services   ? ? ?  ?Precautions / Restrictions Precautions ?Precautions: Fall ?Precaution Comments: watch O2 ?Restrictions ?Weight Bearing Restrictions: No  ? ?  ? ?Mobility Bed Mobility ?Overal bed mobility: Modified Independent ?  ?  ?  ?  ?  ?  ?General bed mobility comments: No difficulties coming to sit ?  ? ?Transfers ?Overall transfer level: Needs  assistance ?Equipment used: None ?Transfers: Sit to/from Stand ?Sit to Stand: Modified independent (Device/Increase time) ?  ?  ?  ?  ?  ?  ?  ? ?  ?Balance Overall balance assessment: Mild deficits observed, not formally tested ?  ?  ?  ?  ?  ?  ?  ?  ?  ?  ?  ?  ?  ?  ?  ?  ?  ?  ?   ? ?ADL either performed or assessed with clinical judgement  ? ?ADL Overall ADL's : At baseline;Modified independent ?  ?  ?  ?  ?  ?  ?  ?  ?  ?  ?  ?  ?  ?  ?  ?  ?  ?  ?  ?   ? ? ? ?Vision Baseline Vision/History: 1 Wears glasses ?Ability to See in Adequate Light: 0 Adequate ?Patient Visual Report: No change from baseline ?Vision Assessment?: No apparent visual deficits  ?   ?Perception   ?  ?Praxis   ?  ? ?Pertinent Vitals/Pain Pain Assessment ?Pain Assessment: No/denies pain  ? ? ? ?Hand Dominance Right ?  ?Extremity/Trunk Assessment Upper Extremity Assessment ?Upper Extremity Assessment: Overall WFL for tasks assessed ?  ?Lower Extremity Assessment ?Lower Extremity Assessment: Defer to PT evaluation ?  ?Cervical / Trunk Assessment ?Cervical / Trunk Assessment: Normal ?  ?Communication Communication ?Communication: No difficulties ?  ?Cognition Arousal/Alertness: Awake/alert ?Behavior During Therapy: [facility] for tasks assessed/performed ?Overall Cognitive Status: Within Functional Limits for tasks assessed ?  ?  ?  ?  ?  ?  ?  ?  ?  ?  ?  ?  ?  ?  ?  ?  ?  ?  ?  ?  General Comments  Pt on 6L, in sitting 93%, standing, donning socks, taking steps 84%. Increased time for O2 to return to above 90%. ? ?  ?Exercises   ?  ?Shoulder Instructions    ? ? ?Home Living Family/patient expects to be discharged to:: Private residence ?Living Arrangements: Spouse/significant other ?Available Help at Discharge: Family ?Type of Home: House ?Home Access: Stairs to enter ?Entrance Stairs-Number of Steps: 1 ?  ?Home Layout: One level ?  ?  ?Bathroom Shower/Tub: Tub/shower unit ?  ?Bathroom Toilet: Handicapped height ?  ?  ?Home Equipment: None ?  ?  ?   ? ?  ?Prior Functioning/Environment Prior Level of Function : Independent/Modified Independent ?  ?  ?  ?  ?  ?  ?Mobility Comments: wears 2-4 L O2 ?  ?  ? ?  ?  ?OT Problem List: Decreased strength;Decreased activity tolerance;Cardiopulmonary status limiting activity ?  ?   ?OT Treatment/Interventions:    ?  ?OT Goals(Current goals can be found in the care plan section) Acute Rehab OT Goals ?Patient Stated Goal: To breathe better and drive again ?OT Goal Formulation: With patient ?Time For Goal Achievement: 08/11/21 ?Potential to Achieve Goals: Good  ?OT Frequency:   ?  ? ?Co-evaluation   ?  ?  ?  ?  ? ?  ?AM-PAC OT "6 Clicks" Daily Activity     ?Outcome Measure Help from another person eating meals?: None ?Help from another person taking care of personal grooming?: None ?Help from another person toileting, which includes using toliet, bedpan, or urinal?: None ?Help from another person bathing (including washing, rinsing, drying)?: None ?Help from another person to put on and taking off regular upper body clothing?: None ?Help from another person to put on and taking off regular lower body clothing?: None ?6 Click Score: 24 ?  ?End of Session Equipment Utilized During Treatment: Oxygen;Rolling walker (2 wheels) ?Nurse Communication: Mobility status ? ?Activity Tolerance: Patient tolerated treatment well ?Patient left: in chair;with call bell/phone within reach ? ?OT Visit Diagnosis: Unsteadiness on feet (R26.81);Other abnormalities of gait and mobility (R26.89);Muscle weakness (generalized) (M62.81)  ?              ?Time: 6546-5035 ?OT Time Calculation (min): 15 min ?Charges:  OT General Charges ?$OT Visit: 1 Visit ?OT Evaluation ?$OT Eval Moderate Complexity: 1 Mod ? ?Bari Handshoe H., OTR/L ?Acute Rehabilitation ? ?[place] Piggee Elane Yolanda Bonine ?08/11/2021, 5:06 PM ?

## 2021-08-11 NOTE — Evaluation (Signed)
Physical Therapy Evaluation ?Patient Details ?Name: [name] ?MRN: [id number] ?DOB: 05-29-1940 ?Today's Date: 08/11/2021 ? ?History of Present Illness ? 81 y/o male presented to ED on 08/10/21 for low spO2 and SOB on exertion. CXR showed persistent pulmonary fibrosis. Admitted for acute on chronic respiratory failure/COPD exacerbation. PMH: COPD on 2-3L, RA, CKD stage IIIb, HTN, OSA  ?Clinical Impression ? Patient admitted with the above. Patient with primary deficit in activity tolerance. Patient ambulated 10' on 6L O2 [place] with drop in spO2 to 80%. Increased to 8L O2 with slow increase to 88%. Ambulated around bed to recliner with drop to 83%. Increased to 10 L O2 with increase to 95% and slowly titrated down to 6L with spO2 maintaining 93-95%. Notified RN. Patient will benefit from skilled PT services during acute stay to address listed deficits. Recommend HHPT at discharge to maximize activity tolerance and mobility.    ?   ? ?Recommendations for follow up therapy are one component of a multi-disciplinary discharge planning process, led by the attending physician.  Recommendations may be updated based on patient status, additional functional criteria and insurance authorization. ? ?Follow Up Recommendations Home health PT ? ?  ?Assistance Recommended at Discharge Intermittent Supervision/Assistance  ?Patient can return home with the following ? Assistance with cooking/housework;A little help with bathing/dressing/bathroom;Assist for transportation;Help with stairs or ramp for entrance ? ?  ?Equipment Recommendations None recommended by PT  ?Recommendations for Other Services ?    ?  ?Functional Status Assessment Patient has had a recent decline in their functional status and demonstrates the ability to make significant improvements in function in a reasonable and predictable amount of time.  ? ?  ?Precautions / Restrictions Precautions ?Precautions: Fall ?Precaution Comments: watch O2 ?Restrictions ?Weight  Bearing Restrictions: No  ? ?  ? ?Mobility ? Bed Mobility ?  ?  ?  ?  ?  ?  ?  ?General bed mobility comments: sitting in bathroom on arrival ?  ? ?Transfers ?Overall transfer level: Needs assistance ?Equipment used: None ?Transfers: Sit to/from Stand ?Sit to Stand: Supervision ?  ?  ?  ?  ?  ?  ?  ? ?Ambulation/Gait ?Ambulation/Gait assistance: Supervision ?Gait Distance (Feet): 10 Feet (+6) ?Assistive device: None ?Gait Pattern/deviations: Step-through pattern, Decreased stride length ?Gait velocity: decreased ?  ?  ?General Gait Details: supervision for safety and line management ? ?Stairs ?  ?  ?  ?  ?  ? ?Wheelchair Mobility ?  ? ?Modified Rankin (Stroke Patients Only) ?  ? ?  ? ?Balance Overall balance assessment: Mild deficits observed, not formally tested ?  ?  ?  ?  ?  ?  ?  ?  ?  ?  ?  ?  ?  ?  ?  ?  ?  ?  ?   ? ? ? ?Pertinent Vitals/Pain Pain Assessment ?Pain Assessment: No/denies pain  ? ? ?Home Living Family/patient expects to be discharged to:: Private residence ?Living Arrangements: Spouse/significant other ?Available Help at Discharge: Family ?Type of Home: House ?Home Access: Stairs to enter ?  ?Entrance Stairs-Number of Steps: 1 ?  ?Home Layout: One level ?Home Equipment: None ?   ?  ?Prior Function Prior Level of Function : Independent/Modified Independent ?  ?  ?  ?  ?  ?  ?Mobility Comments: wears 2-4 L O2 ?  ?  ? ? ?Hand Dominance  ?   ? ?  ?Extremity/Trunk Assessment  ? Upper Extremity Assessment ?Upper Extremity Assessment: Defer  to OT evaluation ?  ? ?Lower Extremity Assessment ?Lower Extremity Assessment: Generalized weakness ?  ? ?Cervical / Trunk Assessment ?Cervical / Trunk Assessment: Normal  ?Communication  ? Communication: No difficulties  ?Cognition Arousal/Alertness: Awake/alert ?Behavior During Therapy: [facility] for tasks assessed/performed ?Overall Cognitive Status: Within Functional Limits for tasks assessed ?  ?  ?  ?  ?  ?  ?  ?  ?  ?  ?  ?  ?  ?  ?  ?  ?  ?  ?  ? ?  ?General  Comments General comments (skin integrity, edema, etc.): Ambulating in room on 6L O2 [place] with drop in spO2 to 80%. Increased to 8L in sitting with slow increase to 88%. Ambulated to recliner with decrease to 83%. Placed on 10 L O2 with recovery to 95%. Slowly titrated back down to 6L O2 with spO2 93-95% while resting ? ?  ?Exercises    ? ?Assessment/Plan  ?  ?PT Assessment Patient needs continued PT services  ?PT Problem List Decreased strength;Decreased activity tolerance;Cardiopulmonary status limiting activity ? ?   ?  ?PT Treatment Interventions DME instruction;Gait training;Functional mobility training;Therapeutic activities;Therapeutic exercise;Balance training;Patient/family education   ? ?PT Goals (Current goals can be found in the Care Plan section)  ?Acute Rehab PT Goals ?Patient Stated Goal: to breathe better ?PT Goal Formulation: With patient ?Time For Goal Achievement: 08/25/21 ?Potential to Achieve Goals: Good ? ?  ?Frequency Min 3X/week ?  ? ? ?Co-evaluation   ?  ?  ?  ?  ? ? ?  ?AM-PAC PT "6 Clicks" Mobility  ?Outcome Measure Help needed turning from your back to your side while in a flat bed without using bedrails?: A Little ?Help needed moving from lying on your back to sitting on the side of a flat bed without using bedrails?: A Little ?Help needed moving to and from a bed to a chair (including a wheelchair)?: A Little ?Help needed standing up from a chair using your arms (e.g., wheelchair or bedside chair)?: A Little ?Help needed to walk in hospital room?: A Little ?Help needed climbing 3-5 steps with a railing? : A Little ?6 Click Score: 18 ? ?  ?End of Session Equipment Utilized During Treatment: Oxygen ?Activity Tolerance: Patient limited by fatigue ?Patient left: in chair;with call bell/phone within reach ?Nurse Communication: Mobility status ?PT Visit Diagnosis: Muscle weakness (generalized) (M62.81) ?  ? ?Time: 2353-6144 ?PT Time Calculation (min) (ACUTE ONLY): 24 min ? ? ?Charges:   PT  Evaluation ?$PT Eval Moderate Complexity: 1 Mod ?PT Treatments ?$Therapeutic Activity: 8-22 mins ?  ?   ? ? ?Channie Bostick A. Gilford Rile, PT, DPT ?Acute Rehabilitation Services ?Pager [phone] ?Office [phone] ? ? ?Danell Verno A Tonye Tancredi ?08/11/2021, 3:39 PM ? ?

## 2021-08-11 NOTE — Consult Note (Addendum)
? ?NAME:  [name], MRN:  [id number], DOB:  08/03/1940, LOS: 1 ?ADMISSION DATE:  08/10/2021, CONSULTATION DATE:  08/11/21 ?REFERRING MD:  Dr. Candiss Norse, CHIEF COMPLAINT:  Dyspnea  ? ?History of Present Illness:  ?81 year old male with prior history of ILD/ UIP, RA, chronic hypoxic respiratory failure on HOT 2-4L, OSA on CPAP, CKD stage IIIb, HTN, and GERD who presented on 4/14 with progressive dyspnea, productive cough, and hypoxia.  ? ?Patient recently hospitalized 2/24 to 06/25/21 with ILD exacerbation and possible CAP treated with antibiotics and steroid taper discharged home with home oxygen, which was new for him.   He is followed in our clinic by Dr. Chase Caller, seen 3/3.  He was switched from methotrexate to Imuran afterwards and recently started on Ofev on 4/13, thus far tolerating with mild nausea.  Most recent HRCT 07/30/21 showed progression of fibrosis with new upper lobe predominant GGO.  He reports overall been doing well but over the last three weeks with progressive exertional dyspnea and increased O2 needs at rest from 2-4L.  Reports same productive cough, yellowish, but now with more of it.  Denies fever, chills, hemoptysis, choking episodes, or recent sick contacts.  Does endorse some sore throat and runny nose-  mostly in the morning which resolves throughout the day.  Of note, he has not been wearing his CPAP at home since going on home oxygen as he was not sure how to combine the two.  Yesterday, he reported that his O2 sats were in the 60's on 4L and was advised by our office to go to ER for evaluation.  ? ?This admit, he has been afebrile, hemodynamically stable with labs noted for normal WBC, but CRP up from 15.3 in February to 17.1 and sed rate 103 then now 94.  CXR shows increased upper lobe predominant GGO, more so in RUL.  He was started on azithro, ceftriaxone, nebs, and steroids and admitted to [facility].  He since has been on 3-5L [place] and wore CPAP overnight.  Pulmonary consulted for further  input.  ? ?Pertinent  Medical History  ?ILD/ UIP on Ofev, RA on Imuran, chronic hypoxic respiratory failure on HOT 2-4L, OSA on CPAP, CKDIIIb, GERD, HFpEF ? ?Significant Hospital Events: ?Including procedures, antibiotic start and stop dates in addition to other pertinent events   ?4/14 admitted to [facility] ?4/15 pulmonary consult ? ?Interim History / Subjective:  ? ?Objective   ?Blood pressure 136/72, pulse 93, temperature 98.3 ?F (36.8 ?C), temperature source Oral, resp. rate 19, height '5\' 8"'$  (1.727 m), weight 87.5 kg, SpO2 96 %. ?   ?   ? ?Intake/Output Summary (Last 24 hours) at 08/11/2021 0853 ?Last data filed at 08/10/2021 2107 ?Gross per 24 hour  ?Intake 322.11 ml  ?Output 200 ml  ?Net 122.11 ml  ? ?Filed Weights  ? 08/10/21 1705 08/11/21 0059  ?Weight: 90.3 kg 87.5 kg  ? ?Examination: ?General:  Pleasant elderly male sitting upright in bed watching TV on CPAP in NAD ?HEENT: MM pink/moist ?Neuro:  Aox4, MAE ?CV: rr, NSR ?PULM:  non labored, occasionally tachpneic, clear anteriorly, posterior bibasilar rales and on the right posterior rales up to mid lung ?GI: soft, bs+, NT ?Extremities: warm/dry, trace ankle edema  ?Skin: no rashes  ? ?Resolved Hospital Problem list   ? ?Assessment & Plan:  ? ?ILD/ UIP exacerbation  ?Acute on chronic hypoxic respiratory failure ?OSA ?- possibly related to infectious process, although afebrile and no leukocytosis.  Check PCT and continue CAP coverage  for now ?- RVP pending ?- send expectorated sputum for sample if able to produce ?- check strep UA ?- also check BNP.  Will give dose of lasix today as well.  ?- continue solumedrol '40mg'$  q 12hrs.  Will need a slow taper ?- question if recent transition from methotrexate to Imuran (possibly for risk of hepatotoxicity with starting Ofev) is causing flair given elevated inflammatory markers.  LFTs wnl this admit.  Will check RF and CCP.   ?- continue [place] 2-4L.  Expect with exertion that he will desaturate but should recover in several  minutes.  May increase O2 as needed during these time.  Reassuring, he is requiring his baseline home O2 at this time, currently on 3L at > 95% during my exam ?- if his WOB was to increase-> heated high flow [place] would be the next step.  Avoid BiPAP, as this is usually last resort in ILD patient's and GOC's would need to be re addressed as he remains full code.  ?- will continue Ofev when family brings in from home ?- continue BD scheduled and prn  ?- continue PPI for GERD ?- continue singular, guaifenesin, and adding flonase  ?- ongoing aggressive pulm hygiene- IS, flutter, PT/ mobilize  ?- cont CPAP q HS ?- if no improvement despite all above, consider FOB given immunosuppression  ? ? ?Remainder per primary team.  Pulmonary will continue to follow. Please let us know if worsening respiratory distress or sudden jump in O2 requirements ? ?Best Practice (right click and "Reselect all SmartList Selections" daily)  ? ?Diet/type: Regular consistency (see orders) ?DVT prophylaxis: LMWH ?GI prophylaxis: PPI ?Lines: N/A ?Foley:  N/A ?Code Status:  full code ?Last date of multidisciplinary goals of care discussion [per primary] ? ?Labs   ?CBC: ?Recent Labs  ?Lab 08/10/21 ?1825 08/11/21 ?0108  ?WBC 10.9* 9.8  ?HGB 12.4* 12.8*  ?HCT 38.4* 38.0*  ?MCV 93.7 91.1  ?PLT 264 258  ? ? ?Basic Metabolic Panel: ?Recent Labs  ?Lab 08/10/21 ?1825 08/11/21 ?0108  ?NA 137 136  ?K 5.2* 4.7  ?CL 98 103  ?CO2 25 24  ?GLUCOSE 103* 178*  ?BUN 27* 26*  ?CREATININE 1.45* 1.40*  ?CALCIUM 9.7 9.2  ? ?GFR: ?Estimated Creatinine Clearance: 44.5 mL/min (A) (by C-G formula based on SCr of 1.4 mg/dL (H)). ?Recent Labs  ?Lab 08/10/21 ?1825 08/11/21 ?0108  ?PROCALCITON  --  <0.10  ?WBC 10.9* 9.8  ? ? ?Liver Function Tests: ?Recent Labs  ?Lab 08/10/21 ?1825  ?AST 25  ?ALT 14  ?ALKPHOS 45  ?BILITOT 0.8  ?PROT 7.6  ?ALBUMIN 3.2*  ? ?No results for input(s): LIPASE, AMYLASE in the last 168 hours. ?No results for input(s): AMMONIA in the last 168  hours. ? ?ABG ?   ?Component Value Date/Time  ? PHART 7.397 06/15/2019 0505  ? PCO2ART 40.2 06/15/2019 0505  ? PO2ART 84.4 06/15/2019 0505  ? HCO3 24.3 06/15/2019 0505  ? TCO2 26 06/14/2019 1232  ? ACIDBASEDEF 0.0 06/15/2019 0505  ? O2SAT 96.1 06/15/2019 0505  ?  ? ?Coagulation Profile: ?No results for input(s): INR, PROTIME in the last 168 hours. ? ?Cardiac Enzymes: ?No results for input(s): CKTOTAL, CKMB, CKMBINDEX, TROPONINI in the last 168 hours. ? ?HbA1C: ?No results found for: HGBA1C ? ?CBG: ?No results for input(s): GLUCAP in the last 168 hours. ? ?Review of Systems:   ?Review of Systems  ?Constitutional:  Negative for chills, fever and weight loss.  ?HENT:  Positive for sore throat.   ?  Respiratory:  Positive for cough, sputum production and shortness of breath. Negative for hemoptysis and wheezing.   ?Cardiovascular:  Negative for chest pain and leg swelling.  ?Gastrointestinal:  Positive for nausea. Negative for abdominal pain and vomiting.  ?Neurological:  Negative for focal weakness and weakness.  ?Endo/Heme/Allergies:  Positive for environmental allergies.  ? ?Past Medical History:  ?He,  has a past medical history of Adenomatous colon polyp, Collapsed lung (04/2018), Diverticulosis, GERD (gastroesophageal reflux disease), Hemorrhoids, Hiatal hernia, Hypertension, ILD (interstitial lung disease) ([place]), OSA on CPAP, Pneumonia (11/2019), Rheumatoid arthritis ([place]), and S/P dilatation of esophageal stricture.  ? ?Surgical History:  ? ?Past Surgical History:  ?Procedure Laterality Date  ? CIRCUMCISION    ? HEMORRHOID SURGERY  2002  ? PLEURADESIS Right 06/14/2019  ? Procedure: PLEURADESIS USING TALC;  Surgeon: Prescott Gum, Collier Salina, MD;  Location: [place];  Service: Thoracic;  Laterality: Right;  ? STAPLING OF BLEBS Right 06/14/2019  ? Procedure: STAPLING OF BLEBS;  Surgeon: [name], MD;  Location: [place];  Service: Thoracic;  Laterality: Right;  ? VIDEO ASSISTED THORACOSCOPY Right 06/14/2019  ? Procedure:  VIDEO ASSISTED THORACOSCOPY WITH RIGHT UPPER LOBE WEDGE;  Surgeon: [name], MD;  Location: [place];  Service: Thoracic;  Laterality: Right;  ?  ? ?Social History:  ? reports that he has never smoked. He has never used smok

## 2021-08-11 NOTE — Progress Notes (Signed)
?                                  PROGRESS NOTE                                             ?                                                                                                                     ?                                         ? ? Patient Demographics:  ? ? [name], is a 81 y.o. male, DOB - 16-Apr-1941, NUU:[id number] ? ?Outpatient Primary MD for the patient is [name], MD    LOS - 1  Admit date - 08/10/2021   ? ?Chief Complaint  ?Patient presents with  ? Shortness of Breath  ?    ? ?Brief Narrative (HPI from H&P)    81 y.o. male with medical history significant for interstitial lung disease/UIP, chronic respiratory failure with hypoxia on 2 L O2 via [place], rheumatoid arthritis on Imuran, CKD stage IIIb, HTN, GERD, OSA on CPAP who presented to the ED for evaluation of dyspnea with hypoxia.  He was diagnosed with acute on chronic hypoxic respiratory failure due to ILD exacerbation of note at baseline he uses 3 L nasal cannula oxygen at home and in the ER on 3 L he was saturating around 65%, he was placed on CPAP to keep his pulse ox over 90% in the ER. ? ? Subjective:  ? ? Talan Gildner today has, No headache, No chest pain, No abdominal pain - No Nausea, No new weakness tingling or numbness, +ve SOB. ? ? Assessment  & Plan :  ? ? ?Acute on chronic respiratory failure with hypoxia  due to ILD exacerbation -  Presenting with worsening exertional dyspnea, cannot rule out superimposed bacterial pneumonia.  At this time he has been placed on moderate dose IV steroids along with empiric IV azithromycin and Rocephin.  Continue nebulizer treatments and supplemental oxygen.  Pulmonary critical care has been consulted as well.  We will also continue home medication Ofev (just started 4/13) once daily x1wk then BID ? ?Hyperkalemia - Give given Lokelma improved we will continue to monitor. ? ?Essential hypertension - Continue home dose amlodipine. ? ?CKD stage  G3b/A1, GFR 30-44 and albumin creatinine ratio <30 mg/g (HCC) - Stable, continue to monitor.  Baseline creatinine appears to be close to 1.5. ? ?OSA (obstructive sleep apnea) - Continue CPAP nightly.  Patient states he has not been using recently since started on supplemental O2 via [place].  Will try and use  CPAP at night. ? ?Rheumatoid arthritis ([place]) - Recently switched from chronic methotrexate to Imuran.  Hold for now.  ? ?   ? ?Condition - Extremely Guarded ? ?Family Communication  : Wife [name] [phone] on 08/11/2021 ? ?Code Status :  Full ? ?Consults  :  PCCM ? ?PUD Prophylaxis : PPI ? ? Procedures  :    ? ?  ? ?   ? ?Disposition Plan  :   ? ?Status is: Inpatient ? ?DVT Prophylaxis  :   ? ?enoxaparin (LOVENOX) injection 40 mg Start: 08/10/21 2200 ?  ? ?Lab Results  ?Component Value Date  ? PLT 258 08/11/2021  ? ? ?Diet :  ?Diet Order   ? ?       ?  Diet renal with fluid restriction Fluid restriction: 1200 mL Fluid; Room service appropriate? Yes; Fluid consistency: Thin  Diet effective now       ?  ? ?  ?  ? ?  ?  ? ?Inpatient Medications ? ?Scheduled Meds: ? amLODipine  10 mg Oral Daily  ? atorvastatin  10 mg Oral Daily  ? enoxaparin (LOVENOX) injection  40 mg Subcutaneous Q24H  ? fluticasone  2 spray Each Nare Daily  ? furosemide  40 mg Intravenous Once  ? guaiFENesin  600 mg Oral BID  ? ipratropium-albuterol  3 mL Nebulization Q6H  ? methylPREDNISolone (SOLU-MEDROL) injection  40 mg Intravenous Q12H  ? montelukast  10 mg Oral QHS  ? Nintedanib  150 mg Oral See admin instructions  ? pantoprazole  40 mg Oral Daily  ? ?Continuous Infusions: ? azithromycin Stopped (08/10/21 2107)  ? cefTRIAXone (ROCEPHIN)  IV    ? ?PRN Meds:.acetaminophen **OR** acetaminophen, albuterol, ondansetron **OR** ondansetron (ZOFRAN) IV, senna-docusate ? ?Antibiotics  :   ? ?Anti-infectives (From admission, onward)  ? ? Start     Dose/Rate Route Frequency Ordered Stop  ? 08/11/21 2000  cefTRIAXone (ROCEPHIN) 2 g in sodium chloride 0.9 %  100 mL IVPB       ? 2 g ?200 mL/hr over 30 Minutes Intravenous Every 24 hours 08/10/21 2110    ? 08/10/21 1930  cefTRIAXone (ROCEPHIN) 2 g in sodium chloride 0.9 % 100 mL IVPB       ? 2 g ?200 mL/hr over 30 Minutes Intravenous  Once 08/10/21 1917 08/10/21 1955  ? 08/10/21 1930  azithromycin (ZITHROMAX) 500 mg in sodium chloride 0.9 % 250 mL IVPB       ? 500 mg ?250 mL/hr over 60 Minutes Intravenous Every 24 hours 08/10/21 1917    ? ?  ? ? ? Time Spent in minutes  30 ? ? ?Lala Lund M.D on 08/11/2021 at 9:53 AM ? ?To page go to [email or web address]  ? ?[facility] -  Office  [phone] ? ?See all Orders from today for further details ? ? ? Objective:  ? ?Vitals:  ? 08/11/21 0500 08/11/21 0517 08/11/21 5852 08/11/21 0852  ?BP:  119/80 136/72   ?Pulse:  95 93   ?Resp: '19 17 19   '$ ?Temp:  98.2 ?F (36.8 ?C) 98.3 ?F (36.8 ?C)   ?TempSrc:  Axillary Oral   ?SpO2:  93% 96% 96%  ?Weight:      ?Height:      ? ? ?Wt Readings from Last 3 Encounters:  ?08/11/21 87.5 kg  ?06/29/21 92 kg  ?06/25/21 90.3 kg  ? ? ? ?Intake/Output Summary (Last 24 hours) at 08/11/2021 0953 ?Last data filed at 08/10/2021 2107 ?[name]  per 24 hour  ?Intake 322.11 ml  ?Output 200 ml  ?Net 122.11 ml  ? ? ? ?Physical Exam ? ?Awake Alert, No new F.N deficits, Normal affect ?[place].AT,PERRAL ?Supple Neck, No JVD,   ?Symmetrical Chest wall movement, Good air movement bilaterally, few fine rales ?RRR,No Gallops,Rubs or new Murmurs,  ?+ve B.Sounds, Abd Soft, No tenderness,   ?No Cyanosis, Clubbing or edema  ?  ? ? Data Review:  ? ? ?CBC ?Recent Labs  ?Lab 08/10/21 ?1825 08/11/21 ?0108  ?WBC 10.9* 9.8  ?HGB 12.4* 12.8*  ?HCT 38.4* 38.0*  ?PLT 264 258  ?MCV 93.7 91.1  ?MCH 30.2 30.7  ?MCHC 32.3 33.7  ?RDW 14.8 14.6  ? ? ?Electrolytes ?Recent Labs  ?Lab 08/10/21 ?1825 08/11/21 ?0108  ?NA 137 136  ?K 5.2* 4.7  ?CL 98 103  ?CO2 25 24  ?GLUCOSE 103* 178*  ?BUN 27* 26*  ?CREATININE 1.45* 1.40*  ?CALCIUM 9.7 9.2  ?AST 25  --   ?ALT 14  --   ?ALKPHOS 45  --   ?BILITOT 0.8   --   ?ALBUMIN 3.2*  --   ?CRP  --  17.1*  ?PROCALCITON  --  <0.10  ? ? ?------------------------------------------------------------------------------------------------------------------ ?No results for input(s): CHOL, HDL, LDLCALC, TRIG, CHOLHDL, LDLDIRECT in the last 72 hours. ? ?No results found for: HGBA1C ? ?No results for input(s): TSH, T4TOTAL, T3FREE, THYROIDAB in the last 72 hours. ? ?Invalid input(s): FREET3 ?------------------------------------------------------------------------------------------------------------------ ?ID Labs ?Recent Labs  ?Lab 08/10/21 ?1825 08/11/21 ?0108  ?WBC 10.9* 9.8  ?PLT 264 258  ?CRP  --  17.1*  ?PROCALCITON  --  <0.10  ?CREATININE 1.45* 1.40*  ? ?Cardiac Enzymes ?No results for input(s): CKMB, TROPONINI, MYOGLOBIN in the last 168 hours. ? ?Invalid input(s): CK ? ?Radiology Reports ?DG Chest Port 1 View ? ?Result Date: 08/10/2021 ?CLINICAL DATA:  Shortness of breath EXAM: PORTABLE CHEST 1 VIEW COMPARISON:  06/22/2021 FINDINGS: Single frontal view of the chest demonstrates a stable cardiac silhouette. Persistent findings of pulmonary fibrosis. There is patchy multifocal ground-glass consolidation, most pronounced in the right upper lobe. Favor edema or infection superimposed upon background scarring. No effusion or pneumothorax. No acute bony abnormalities. IMPRESSION: 1. Interval increase in patchy bilateral ground-glass consolidation, most pronounced within the right upper lobe. Favor edema or infection superimposed upon chronic background scarring and fibrosis. Electronically Signed   By: Randa Ngo M.D.   On: 08/10/2021 19:00    ? ? ?

## 2021-08-11 NOTE — ED Notes (Signed)
Report given to Manuela Schwartz, RN. (5W). ?

## 2021-08-11 NOTE — Evaluation (Signed)
Clinical/Bedside Swallow Evaluation ?Patient Details  ?Name: [name] ?MRN: [id number] ?Date of Birth: 07/22/1940 ? ?Today's Date: 08/11/2021 ?Time: SLP Start Time (ACUTE ONLY): [id number] SLP Stop Time (ACUTE ONLY): 1012 ?SLP Time Calculation (min) (ACUTE ONLY): 17 min ? ?Past Medical History:  ?Past Medical History:  ?Diagnosis Date  ? Adenomatous colon polyp   ? Collapsed lung 04/2018  ? Diverticulosis   ? GERD (gastroesophageal reflux disease)   ? Hemorrhoids   ? internal and external  ? Hiatal hernia   ? Hypertension   ? ILD (interstitial lung disease) ([place])   ? OSA on CPAP   ? Pneumonia 11/2019  ? Rheumatoid arthritis ([place])   ? S/P dilatation of esophageal stricture   ? ?Past Surgical History:  ?Past Surgical History:  ?Procedure Laterality Date  ? CIRCUMCISION    ? HEMORRHOID SURGERY  2002  ? PLEURADESIS Right 06/14/2019  ? Procedure: PLEURADESIS USING TALC;  Surgeon: Prescott Gum, Collier Salina, MD;  Location: [facility];  Service: Thoracic;  Laterality: Right;  ? STAPLING OF BLEBS Right 06/14/2019  ? Procedure: STAPLING OF BLEBS;  Surgeon: [name], MD;  Location: [facility];  Service: Thoracic;  Laterality: Right;  ? VIDEO ASSISTED THORACOSCOPY Right 06/14/2019  ? Procedure: VIDEO ASSISTED THORACOSCOPY WITH RIGHT UPPER LOBE WEDGE;  Surgeon: Prescott Gum, Collier Salina, MD;  Location: [facility];  Service: Thoracic;  Laterality: Right;  ? ?HPI:  ?[name] is a 81 y.o. male with medical history significant for interstitial lung disease/UIP, chronic respiratory failure with hypoxia on 2 L O2 via [place], rheumatoid arthritis on Imuran, CKD stage IIIb, HTN, GERD, OSA on CPAP who presented to the ED for evaluation of dyspnea with hypoxia.  Patient recently admitted 06/22/2021-06/25/2021 for acute hypoxic respiratory failure secondary to ILD exacerbation with possible superimposed pneumonia.  He was treated with steroids and antibiotics.  He was discharged with prednisone taper and new 2 L O2 via [place] requirement.   He was diagnosed with acute on  chronic respiratory failure with hypoxia, ILD and hyperkalemia.  Most recent chest xray was showing interval increase in patchy ground glass consolidation most pronounced in the RUL favored to be edema or infectdion superimposed on chronic background scarring and fibrosis.  ?  ?Assessment / Plan / Recommendation  ?Clinical Impression ? Clinical swallowing evaluation was completed using thin liquids via spoon, cup and straw, pureed material and dry solids.  He does not endorse any trouble swallwoing prior to admission, although, he did report drops in his O2 with intake this AM.  However, nasal canula noted to not be in his nose with O2 noted to be in the upper 80s.  This was corrected and O2 levels went back into the mid 90s.  Wonder if this was the case with intake earlier this AM.  Cranial nerve exam was completed and unremarkable.  Lingual, labial, facial and jaw range of motion and strength appeared to be adequate.  Facial sensation appeared to be intact and he did not endorse a difference in sensation between the right and left side of his face.  There is concern for a possible pharyngeal dysphagia. Mastication of dry solids appeared adequate with good oral clearance.  Swallow trigger was appreciated to palpation.  He was noted to have increased shortness of breath especially given serial sips of thin liquids via self fed cup sips and interimittent throat clearing given thin liquids via straw.  Given clinical presentation suggest he continue with current diet pending reuslts of MBS to fully assess  swallowing physiology and safety. ?SLP Visit Diagnosis: Dysphagia, unspecified (R13.10) ?   ?Aspiration Risk ? Mild aspiration risk  ?  ?Diet Recommendation   Regular with thin ? ?Medication Administration: Whole meds with liquid  ?  ?Other  Recommendations Oral Care Recommendations: Oral care BID   ? ?Recommendations for follow up therapy are one component of a multi-disciplinary discharge planning process, led by  the attending physician.  Recommendations may be updated based on patient status, additional functional criteria and insurance authorization. ? ?Follow up Recommendations Other (comment) (TBD)  ? ? ?  ?Assistance Recommended at Discharge None  ?Functional Status Assessment Patient has had a recent decline in their functional status and demonstrates the ability to make significant improvements in function in a reasonable and predictable amount of time.  ?Frequency and Duration min 2x/week  ?2 weeks ?  ?   ? ?Prognosis   Good ? ?  ? ?Swallow Study   ?General Date of Onset: 08/10/21 ?HPI: [name] is a 81 y.o. male with medical history significant for interstitial lung disease/UIP, chronic respiratory failure with hypoxia on 2 L O2 via [place], rheumatoid arthritis on Imuran, CKD stage IIIb, HTN, GERD, OSA on CPAP who presented to the ED for evaluation of dyspnea with hypoxia.  Patient recently admitted 06/22/2021-06/25/2021 for acute hypoxic respiratory failure secondary to ILD exacerbation with possible superimposed pneumonia.  He was treated with steroids and antibiotics.  He was discharged with prednisone taper and new 2 L O2 via [place] requirement.   He was diagnosed with acute on chronic respiratory failure with hypoxia, ILD and hyperkalemia.  Most recent chest xray was showing interval increase in patchy ground glass consolidation most pronounced in the RUL favored to be edema or infectdion superimposed on chronic background scarring and fibrosis. ?Type of Study: Bedside Swallow Evaluation ?Previous Swallow Assessment: None noted at [facility]. ?Diet Prior to this Study: Regular;Thin liquids ?Temperature Spikes Noted: No ?Respiratory Status: Nasal cannula ?History of Recent Intubation: No ?Behavior/Cognition: Alert;Cooperative;Pleasant mood ?Oral Cavity Assessment: Within Functional Limits ?Oral Care Completed by SLP: No ?Oral Cavity - Dentition: Adequate natural dentition ?Vision: Functional for self-feeding ?Self-Feeding  Abilities: Able to feed self ?Patient Positioning: Upright in bed ?Baseline Vocal Quality: Normal ?Volitional Swallow: Able to elicit  ?  ?Oral/Motor/Sensory Function Overall Oral Motor/Sensory Function: Within functional limits   ?Ice Chips Ice chips: Not tested   ?Thin Liquid Thin Liquid: Impaired ?Presentation: Cup;Self Fed;Spoon;Straw ?Pharyngeal  Phase Impairments: Throat Clearing - Delayed (interimttent)  ?  ?Nectar Thick Nectar Thick Liquid: Not tested   ?Honey Thick Honey Thick Liquid: Not tested   ?Puree Puree: Within functional limits ?Presentation: Spoon;Self Fed   ?Solid ? ? ?  Solid: Within functional limits ?Presentation: Self Fed  ? ?  ?Shelly Flatten, MA, CCC-SLP ?Acute Rehab SLP ?[phone] ? ?Lamar Sprinkles ?08/11/2021,10:33 AM ? ? ? ?

## 2021-08-11 NOTE — Progress Notes (Signed)
Pt arrived to the unit and placed in rm 30. Connected to Cardiac Monitor and Oxygen at 3 L/M [place] ?

## 2021-08-12 DIAGNOSIS — M059 Rheumatoid arthritis with rheumatoid factor, unspecified: Secondary | ICD-10-CM | POA: Diagnosis not present

## 2021-08-12 DIAGNOSIS — J9601 Acute respiratory failure with hypoxia: Secondary | ICD-10-CM | POA: Diagnosis not present

## 2021-08-12 DIAGNOSIS — J9621 Acute and chronic respiratory failure with hypoxia: Secondary | ICD-10-CM | POA: Diagnosis not present

## 2021-08-12 LAB — COMPREHENSIVE METABOLIC PANEL
ALT: 15 U/L (ref 0–44)
AST: 21 U/L (ref 15–41)
Albumin: 2.8 g/dL — ABNORMAL LOW (ref 3.5–5.0)
Alkaline Phosphatase: 40 U/L (ref 38–126)
Anion gap: 9 (ref 5–15)
BUN: 34 mg/dL — ABNORMAL HIGH (ref 8–23)
CO2: 24 mmol/L (ref 22–32)
Calcium: 8.9 mg/dL (ref 8.9–10.3)
Chloride: 101 mmol/L (ref 98–111)
Creatinine, Ser: 1.48 mg/dL — ABNORMAL HIGH (ref 0.61–1.24)
GFR, Estimated: 47 mL/min — ABNORMAL LOW (ref >60)
Glucose, Bld: 151 mg/dL — ABNORMAL HIGH (ref 70–99)
Potassium: 5 mmol/L (ref 3.5–5.1)
Sodium: 134 mmol/L — ABNORMAL LOW (ref 135–145)
Total Bilirubin: 0.4 mg/dL (ref 0.3–1.2)
Total Protein: 7.2 g/dL (ref 6.5–8.1)

## 2021-08-12 LAB — CBC WITH DIFFERENTIAL/PLATELET
Abs Immature Granulocytes: 0.14 10*3/uL — ABNORMAL HIGH (ref 0.00–0.07)
Basophils Absolute: 0 10*3/uL (ref 0.0–0.1)
Basophils Relative: 0 %
Eosinophils Absolute: 0 10*3/uL (ref 0.0–0.5)
Eosinophils Relative: 0 %
HCT: 35.2 % — ABNORMAL LOW (ref 39.0–52.0)
Hemoglobin: 11.6 g/dL — ABNORMAL LOW (ref 13.0–17.0)
Immature Granulocytes: 1 %
Lymphocytes Relative: 3 %
Lymphs Abs: 0.6 10*3/uL — ABNORMAL LOW (ref 0.7–4.0)
MCH: 30.2 pg (ref 26.0–34.0)
MCHC: 33 g/dL (ref 30.0–36.0)
MCV: 91.7 fL (ref 80.0–100.0)
Monocytes Absolute: 0.5 10*3/uL (ref 0.1–1.0)
Monocytes Relative: 3 %
Neutro Abs: 16.2 10*3/uL — ABNORMAL HIGH (ref 1.7–7.7)
Neutrophils Relative %: 93 %
Platelets: 259 10*3/uL (ref 150–400)
RBC: 3.84 MIL/uL — ABNORMAL LOW (ref 4.22–5.81)
RDW: 14.7 % (ref 11.5–15.5)
WBC: 17.5 10*3/uL — ABNORMAL HIGH (ref 4.0–10.5)
nRBC: 0 % (ref 0.0–0.2)

## 2021-08-12 LAB — MAGNESIUM: Magnesium: 2.4 mg/dL (ref 1.7–2.4)

## 2021-08-12 LAB — PROCALCITONIN: Procalcitonin: 0.1 ng/mL

## 2021-08-12 LAB — BRAIN NATRIURETIC PEPTIDE: B Natriuretic Peptide: 59.9 pg/mL (ref 0.0–100.0)

## 2021-08-12 LAB — RHEUMATOID FACTOR: Rheumatoid fact SerPl-aCnc: 634.1 IU/mL — ABNORMAL HIGH (ref <14.0)

## 2021-08-12 LAB — C-REACTIVE PROTEIN: CRP: 12.5 mg/dL — ABNORMAL HIGH (ref <1.0)

## 2021-08-12 MED ORDER — LACTATED RINGERS IV SOLN
INTRAVENOUS | Status: AC
Start: 2021-08-12 — End: 2021-08-12

## 2021-08-12 MED ORDER — FUROSEMIDE 10 MG/ML IJ SOLN
40.0000 mg | Freq: Once | INTRAMUSCULAR | Status: AC
  Administered 2021-08-12: 40 mg via INTRAVENOUS
  Filled 2021-08-12: qty 4

## 2021-08-12 NOTE — Progress Notes (Signed)
? ?NAME:  [name], MRN:  [id number], DOB:  1940-09-09, LOS: 2 ?ADMISSION DATE:  08/10/2021, CONSULTATION DATE:  08/11/21 ?REFERRING MD:  Dr. Candiss Norse, CHIEF COMPLAINT:  Dyspnea  ? ?History of Present Illness:  ?81 year old male with prior history of ILD/ UIP, RA, chronic hypoxic respiratory failure on HOT 2-4L, OSA on CPAP, CKD stage IIIb, HTN, and GERD who presented on 4/14 with progressive dyspnea, productive cough, and hypoxia.  ? ?Patient recently hospitalized 2/24 to 06/25/21 with ILD exacerbation and possible CAP treated with antibiotics and steroid taper discharged home with home oxygen, which was new for him.   He is followed in our clinic by Dr. Chase Caller, seen 3/3.  He was switched from methotrexate to Imuran afterwards and recently started on Ofev on 4/13, thus far tolerating with mild nausea.  Most recent HRCT 07/30/21 showed progression of fibrosis with new upper lobe predominant GGO.  He reports overall been doing well but over the last three weeks with progressive exertional dyspnea and increased O2 needs at rest from 2-4L.  Reports same productive cough, yellowish, but now with more of it.  Denies fever, chills, hemoptysis, choking episodes, or recent sick contacts.  Does endorse some sore throat and runny nose-  mostly in the morning which resolves throughout the day.  Of note, he has not been wearing his CPAP at home since going on home oxygen as he was not sure how to combine the two.  Yesterday, he reported that his O2 sats were in the 60's on 4L and was advised by our office to go to ER for evaluation.  ? ?This admit, he has been afebrile, hemodynamically stable with labs noted for normal WBC, but CRP up from 15.3 in February to 17.1 and sed rate 103 then now 94.  CXR shows increased upper lobe predominant GGO, more so in RUL.  He was started on azithro, ceftriaxone, nebs, and steroids and admitted to [facility].  He since has been on 3-5L [place] and wore CPAP overnight.  Pulmonary consulted for further  input.  ? ?Pertinent  Medical History  ?ILD/ UIP on Ofev, RA on Imuran, chronic hypoxic respiratory failure on HOT 2-4L, OSA on CPAP, CKDIIIb, GERD, HFpEF ? ?Significant Hospital Events: ?Including procedures, antibiotic start and stop dates in addition to other pertinent events   ?4/[facility] ?4/15 pulmonary consult ? ?Interim History / Subjective:  ?Mr. Wohler is feeling improved since admission. He wore CPAP overnight, on 4L [place] today. ? ?Objective   ?Blood pressure 112/69, pulse 90, temperature 98.2 ?F (36.8 ?C), temperature source Oral, resp. rate 20, height '5\' 8"'$  (1.727 m), weight 87.5 kg, SpO2 98 %. ?   ?   ? ?Intake/Output Summary (Last 24 hours) at 08/12/2021 1338 ?Last data filed at 08/12/2021 1056 ?Gross per 24 hour  ?Intake 821 ml  ?Output 2000 ml  ?Net -1179 ml  ? ? ?Filed Weights  ? 08/10/21 1705 08/11/21 0059  ?Weight: 90.3 kg 87.5 kg  ? ?Examination: ?General:  elderly man sitting up in the chair in NAD ?HEENT: [place]/AT, eyes anicteric ?Neuro:  awake, alert, moving all extremities ?CV: S1S2, RRR ?PULM:  breathing comfortably on NA 92% on 4L. Less rhales today. ?GI: soft, NT ?Extremities: minimal peripheral edema ?Skin: no diffuse rashes, no wounds ? ?RVP negative ?RF, CCP pending ?PCT  0.1 ?Na+ 134 ?BUN 34 ?Cr 1.48 ?BNP 60 ?WBC 17.5 ?H/H 11.6/35.2 ? ? ?Resolved Hospital Problem list   ? ?Assessment & Plan:  ? ?ILD/  UIP exacerbation; no evidence of viral or bacterial pneumonia ?Acute on chronic hypoxic respiratory failure ?OSA ?-con't empiric steroids and antibiotics ?-goal euvolemia; would prefer to d/c IVF since he is now eating and drinking well ?-con't Ofev (home med brought in) ?-can resume Azathioprine ?-Question if he has had exacerbation of his RA-ILD from recent switch from methotrexate to azathioprine. RD & CCP titers pending. ?-Con't weaning supplemental O2. Needs to walk in the hall today to determine if he is still having such severe desaturations.  ?-Con't PPI ?-Duonebs  Q6h ?-pulmonary hygiene ?-nocturnal CPAP ? ?Hopefully should be stable for discharge home in the next few days with outpatient follow up. ? ?Best Practice (right click and "Reselect all SmartList Selections" daily)  ? ?Diet/type: Regular consistency (see orders) ?DVT prophylaxis: LMWH ?GI prophylaxis: PPI ?Lines: N/A ?Foley:  N/A ?Code Status:  full code ?Last date of multidisciplinary goals of care discussion [per primary] ? ?Labs   ?CBC: ?Recent Labs  ?Lab 08/10/21 ?1825 08/11/21 ?0108 08/12/21 ?0150  ?WBC 10.9* 9.8 17.5*  ?NEUTROABS  --   --  16.2*  ?HGB 12.4* 12.8* 11.6*  ?HCT 38.4* 38.0* 35.2*  ?MCV 93.7 91.1 91.7  ?PLT 264 258 259  ? ? ? ?Basic Metabolic Panel: ?Recent Labs  ?Lab 08/10/21 ?1825 08/11/21 ?0108 08/12/21 ?0150  ?NA 137 136 134*  ?K 5.2* 4.7 5.0  ?CL 98 103 101  ?CO2 '25 24 24  '$ ?GLUCOSE 103* 178* 151*  ?BUN 27* 26* 34*  ?CREATININE 1.45* 1.40* 1.48*  ?CALCIUM 9.7 9.2 8.9  ?MG  --   --  2.4  ? ? ? ? ?Julian Hy, DO 08/12/21 3:36 PM ?[facility] ? ? ?

## 2021-08-12 NOTE — Progress Notes (Signed)
?                                  PROGRESS NOTE                                             ?                                                                                                                     ?                                         ? ? Patient Demographics:  ? ? [name], is a 82 y.o. male, DOB - 1940-09-08, GBT:[id number] ? ?Outpatient Primary MD for the patient is [name], MD    LOS - 2  Admit date - 08/10/2021   ? ?Chief Complaint  ?Patient presents with  ? Shortness of Breath  ?    ? ?Brief Narrative (HPI from H&P)    81 y.o. male with medical history significant for interstitial lung disease/UIP, chronic respiratory failure with hypoxia on 2 L O2 via [place], rheumatoid arthritis on Imuran, CKD stage IIIb, HTN, GERD, OSA on CPAP who presented to the ED for evaluation of dyspnea with hypoxia.  He was diagnosed with acute on chronic hypoxic respiratory failure due to ILD exacerbation of note at baseline he uses 3 L nasal cannula oxygen at home and in the ER on 3 L he was saturating around 65%, he was placed on CPAP to keep his pulse ox over 90% in the ER. ? ? Subjective:  ? ?Patient in bed, appears comfortable, denies any headache, no fever, no chest pain or pressure, much improved shortness of breath , no abdominal pain. No new focal weakness. ? ? Assessment  & Plan :  ? ? ?Acute on chronic respiratory failure with hypoxia  due to ILD exacerbation -  Presenting with worsening exertional dyspnea, cannot rule out superimposed bacterial pneumonia.  At this time he has been placed on moderate dose IV steroids along with empiric IV azithromycin and Rocephin.  Continue nebulizer treatments and supplemental oxygen.  Pulmonary critical care has been consulted as well.  We will also continue home medication Ofev (just started 4/13) once daily x1wk then BID ? ?Essential hypertension - Continue home dose amlodipine. ? ?CKD stage G3b/A1, GFR  30-44 and albumin creatinine ratio <30 mg/g (HCC) - Stable, continue to monitor.  Baseline creatinine appears to be close to 1.5. ? ?OSA (obstructive sleep apnea) - Continue CPAP nightly.  Patient states he has not been using recently since started on supplemental O2 via [place].  Will try and use CPAP at night. ? ?Rheumatoid arthritis ([place]) -  Recently switched from chronic methotrexate to Imuran.  Hold for now.  ? ?Mild dehydration induced sinus tachycardia.  Gently hydrate and monitor. ? ?   ? ?Condition - Extremely Guarded ? ?Family Communication  : Wife [name] [phone] on 08/11/2021 ? ?Code Status :  Full ? ?Consults  :  PCCM ? ?PUD Prophylaxis : PPI ? ? Procedures  :    ? ?  ? ?   ? ?Disposition Plan  :   ? ?Status is: Inpatient ? ?DVT Prophylaxis  :   ? ?enoxaparin (LOVENOX) injection 40 mg Start: 08/10/21 2200 ?  ? ?Lab Results  ?Component Value Date  ? PLT 259 08/12/2021  ? ? ?Diet :  ?Diet Order   ? ?       ?  Diet renal with fluid restriction Fluid restriction: 1200 mL Fluid; Room service appropriate? Yes; Fluid consistency: Thin  Diet effective now       ?  ? ?  ?  ? ?  ?  ? ?Inpatient Medications ? ?Scheduled Meds: ? amLODipine  10 mg Oral Daily  ? atorvastatin  10 mg Oral Daily  ? azithromycin  500 mg Oral Daily  ? enoxaparin (LOVENOX) injection  40 mg Subcutaneous Q24H  ? fluticasone  2 spray Each Nare Daily  ? guaiFENesin  600 mg Oral BID  ? ipratropium-albuterol  3 mL Nebulization Q6H  ? methylPREDNISolone (SOLU-MEDROL) injection  40 mg Intravenous Q12H  ? montelukast  10 mg Oral QHS  ? Nintedanib  150 mg Oral Daily  ? [START ON 08/17/2021] Nintedanib  150 mg Oral BID  ? pantoprazole  40 mg Oral Daily  ? ?Continuous Infusions: ? cefTRIAXone (ROCEPHIN)  IV Stopped (08/11/21 2120)  ? lactated ringers    ? ?PRN Meds:.acetaminophen **OR** acetaminophen, albuterol, menthol-cetylpyridinium, ondansetron **OR** ondansetron (ZOFRAN) IV, senna-docusate ? ?Antibiotics  :   ? ?Anti-infectives (From admission,  onward)  ? ? Start     Dose/Rate Route Frequency Ordered Stop  ? 08/11/21 2000  cefTRIAXone (ROCEPHIN) 2 g in sodium chloride 0.9 % 100 mL IVPB       ? 2 g ?200 mL/hr over 30 Minutes Intravenous Every 24 hours 08/10/21 2110    ? 08/11/21 1930  azithromycin (ZITHROMAX) tablet 500 mg       ? 500 mg Oral Daily 08/11/21 1116    ? 08/10/21 1930  cefTRIAXone (ROCEPHIN) 2 g in sodium chloride 0.9 % 100 mL IVPB       ? 2 g ?200 mL/hr over 30 Minutes Intravenous  Once 08/10/21 1917 08/10/21 1955  ? 08/10/21 1930  azithromycin (ZITHROMAX) 500 mg in sodium chloride 0.9 % 250 mL IVPB  Status:  Discontinued       ? 500 mg ?250 mL/hr over 60 Minutes Intravenous Every 24 hours 08/10/21 1917 08/11/21 1116  ? ?  ? ? ? Time Spent in minutes  30 ? ? ?Lala Lund M.D on 08/12/2021 at 11:15 AM ? ?To page go to [email or web address]  ? ?[facility] -  Office  [phone] ? ?See all Orders from today for further details ? ? ? Objective:  ? ?Vitals:  ? 08/11/21 2041 08/11/21 2316 08/12/21 0311 08/12/21 0800  ?BP:  125/71 112/69   ?Pulse:  94 90   ?Resp:  (!) 21 20   ?Temp:  98.4 ?F (36.9 ?C) 98.2 ?F (36.8 ?C)   ?TempSrc:  Oral Oral   ?SpO2: 100% 97% 96% 98%  ?Weight:      ?Height:      ? ? ?  Wt Readings from Last 3 Encounters:  ?08/11/21 87.5 kg  ?06/29/21 92 kg  ?06/25/21 90.3 kg  ? ? ? ?Intake/Output Summary (Last 24 hours) at 08/12/2021 1115 ?Last data filed at 08/12/2021 1056 ?Gross per 24 hour  ?Intake 821 ml  ?Output 2000 ml  ?Net -1179 ml  ? ? ? ?Physical Exam ? ?Awake Alert, No new F.N deficits, Normal affect ?[place].AT,PERRAL ?Supple Neck, No JVD,   ?Symmetrical Chest wall movement, Good air movement bilaterally, few fine basilar Rales consistent with his ILD ?RRR,No Gallops, Rubs or new Murmurs,  ?+ve B.Sounds, Abd Soft, No tenderness,   ?No Cyanosis, Clubbing or edema  ? ?  ? ? Data Review:  ? ? ?CBC ?Recent Labs  ?Lab 08/10/21 ?1825 08/11/21 ?0108 08/12/21 ?0150  ?WBC 10.9* 9.8 17.5*  ?HGB 12.4* 12.8* 11.6*  ?HCT 38.4* 38.0* 35.2*   ?PLT 264 258 259  ?MCV 93.7 91.1 91.7  ?MCH 30.2 30.7 30.2  ?MCHC 32.3 33.7 33.0  ?RDW 14.8 14.6 14.7  ?LYMPHSABS  --   --  0.6*  ?MONOABS  --   --  0.5  ?EOSABS  --   --  0.0  ?BASOSABS  --   --  0.0  ? ? ?Electrolytes ?Recent Labs  ?Lab 08/10/21 ?1825 08/11/21 ?0108 08/12/21 ?0150  ?NA 137 136 134*  ?K 5.2* 4.7 5.0  ?CL 98 103 101  ?CO2 '25 24 24  '$ ?GLUCOSE 103* 178* 151*  ?BUN 27* 26* 34*  ?CREATININE 1.45* 1.40* 1.48*  ?CALCIUM 9.7 9.2 8.9  ?AST 25  --  21  ?ALT 14  --  15  ?ALKPHOS 45  --  40  ?BILITOT 0.8  --  0.4  ?ALBUMIN 3.2*  --  2.8*  ?MG  --   --  2.4  ?CRP  --  17.1* 12.5*  ?PROCALCITON  --  <0.10 0.10  ?BNP  --   --  59.9  ? ? ?------------------------------------------------------------------------------------------------------------------ ?No results for input(s): CHOL, HDL, LDLCALC, TRIG, CHOLHDL, LDLDIRECT in the last 72 hours. ? ?No results found for: HGBA1C ? ?No results for input(s): TSH, T4TOTAL, T3FREE, THYROIDAB in the last 72 hours. ? ?Invalid input(s): FREET3 ?------------------------------------------------------------------------------------------------------------------ ?ID Labs ?Recent Labs  ?Lab 08/10/21 ?1825 08/11/21 ?0108 08/12/21 ?0150  ?WBC 10.9* 9.8 17.5*  ?PLT 264 258 259  ?CRP  --  17.1* 12.5*  ?PROCALCITON  --  <0.10 0.10  ?CREATININE 1.45* 1.40* 1.48*  ? ?Cardiac Enzymes ?No results for input(s): CKMB, TROPONINI, MYOGLOBIN in the last 168 hours. ? ?Invalid input(s): CK ? ?Radiology Reports ?DG Chest Port 1 View ? ?Result Date: 08/10/2021 ?CLINICAL DATA:  Shortness of breath EXAM: PORTABLE CHEST 1 VIEW COMPARISON:  06/22/2021 FINDINGS: Single frontal view of the chest demonstrates a stable cardiac silhouette. Persistent findings of pulmonary fibrosis. There is patchy multifocal ground-glass consolidation, most pronounced in the right upper lobe. Favor edema or infection superimposed upon background scarring. No effusion or pneumothorax. No acute bony abnormalities. IMPRESSION:  1. Interval increase in patchy bilateral ground-glass consolidation, most pronounced within the right upper lobe. Favor edema or infection superimposed upon chronic background scarring and fibrosis. Electronic

## 2021-08-12 NOTE — Plan of Care (Signed)
?  Problem: Education: ?Goal: Knowledge of General Education information will improve ?Description: Including pain rating scale, medication(s)/side effects and non-pharmacologic comfort measures ?Outcome: Progressing ?  ?Problem: Health Behavior/Discharge Planning: ?Goal: Ability to manage health-related needs will improve ?Outcome: Progressing ?  ?Problem: Clinical Measurements: ?Goal: Ability to maintain clinical measurements within normal limits will improve ?Outcome: Progressing ?Goal: Will remain free from infection ?Outcome: Progressing ?Goal: Diagnostic test results will improve ?Outcome: Progressing ?Goal: Respiratory complications will improve ?Outcome: Progressing ?Goal: Cardiovascular complication will be avoided ?Outcome: Progressing ?  ?Problem: Activity: ?Goal: Risk for activity intolerance will decrease ?Outcome: Progressing ?  ?Problem: Nutrition: ?Goal: Adequate nutrition will be maintained ?Outcome: Progressing ?  ?Problem: Coping: ?Goal: Level of anxiety will decrease ?Outcome: Progressing ?  ?Problem: Elimination: ?Goal: Will not experience complications related to bowel motility ?Outcome: Progressing ?Goal: Will not experience complications related to urinary retention ?Outcome: Progressing ?  ?Problem: Pain Managment: ?Goal: General experience of comfort will improve ?Outcome: Progressing ?  ?Problem: Safety: ?Goal: Ability to remain free from injury will improve ?Outcome: Progressing ?  ?Problem: Skin Integrity: ?Goal: Risk for impaired skin integrity will decrease ?Outcome: Progressing ?  ?Problem: Education: ?Goal: Knowledge of disease or condition will improve ?Outcome: Progressing ?Goal: Knowledge of the prescribed therapeutic regimen will improve ?Outcome: Progressing ?Goal: Individualized Educational Video(s) ?Outcome: Progressing ?  ?Problem: Activity: ?Goal: Will verbalize the importance of balancing activity with adequate rest periods ?Outcome: Progressing ?  ?Problem:  Respiratory: ?Goal: Ability to maintain a clear airway will improve ?Outcome: Progressing ?Goal: Levels of oxygenation will improve ?Outcome: Progressing ?Goal: Ability to maintain adequate ventilation will improve ?Outcome: Progressing ?  ?

## 2021-08-12 NOTE — Progress Notes (Signed)
PT refusing cpap. No resp distress noted.  ?

## 2021-08-12 NOTE — Care Management (Signed)
?  Transition of Care (TOC) Screening Note ? ? ?Patient Details  ?Name: [name] ?Date of Birth: [birth year] ? ? ?Transition of Care (TOC) CM/SW Contact:    ?Carles Collet, RN ?Phone Number: ?08/12/2021, 11:38 AM ? ? ? ?Transition of Care Department [facility]) has reviewed patient and we will continue to monitor patient advancement through interdisciplinary progression rounds.  ? ?Patient is active w [employer] for home oxygen.  ?

## 2021-08-13 ENCOUNTER — Inpatient Hospital Stay (HOSPITAL_COMMUNITY): Payer: Medicare Other

## 2021-08-13 DIAGNOSIS — J9621 Acute and chronic respiratory failure with hypoxia: Secondary | ICD-10-CM | POA: Diagnosis not present

## 2021-08-13 DIAGNOSIS — I5033 Acute on chronic diastolic (congestive) heart failure: Secondary | ICD-10-CM | POA: Diagnosis not present

## 2021-08-13 LAB — COMPREHENSIVE METABOLIC PANEL
ALT: 20 U/L (ref 0–44)
AST: 23 U/L (ref 15–41)
Albumin: 2.7 g/dL — ABNORMAL LOW (ref 3.5–5.0)
Alkaline Phosphatase: 39 U/L (ref 38–126)
Anion gap: 8 (ref 5–15)
BUN: 36 mg/dL — ABNORMAL HIGH (ref 8–23)
CO2: 23 mmol/L (ref 22–32)
Calcium: 8.7 mg/dL — ABNORMAL LOW (ref 8.9–10.3)
Chloride: 103 mmol/L (ref 98–111)
Creatinine, Ser: 1.47 mg/dL — ABNORMAL HIGH (ref 0.61–1.24)
GFR, Estimated: 48 mL/min — ABNORMAL LOW (ref >60)
Glucose, Bld: 137 mg/dL — ABNORMAL HIGH (ref 70–99)
Potassium: 4.7 mmol/L (ref 3.5–5.1)
Sodium: 134 mmol/L — ABNORMAL LOW (ref 135–145)
Total Bilirubin: 0.3 mg/dL (ref 0.3–1.2)
Total Protein: 6.9 g/dL (ref 6.5–8.1)

## 2021-08-13 LAB — CBC WITH DIFFERENTIAL/PLATELET
Abs Immature Granulocytes: 0.14 10*3/uL — ABNORMAL HIGH (ref 0.00–0.07)
Basophils Absolute: 0 10*3/uL (ref 0.0–0.1)
Basophils Relative: 0 %
Eosinophils Absolute: 0 10*3/uL (ref 0.0–0.5)
Eosinophils Relative: 0 %
HCT: 36.1 % — ABNORMAL LOW (ref 39.0–52.0)
Hemoglobin: 11.6 g/dL — ABNORMAL LOW (ref 13.0–17.0)
Immature Granulocytes: 1 %
Lymphocytes Relative: 4 %
Lymphs Abs: 0.6 10*3/uL — ABNORMAL LOW (ref 0.7–4.0)
MCH: 30.1 pg (ref 26.0–34.0)
MCHC: 32.1 g/dL (ref 30.0–36.0)
MCV: 93.5 fL (ref 80.0–100.0)
Monocytes Absolute: 0.5 10*3/uL (ref 0.1–1.0)
Monocytes Relative: 3 %
Neutro Abs: 14.6 10*3/uL — ABNORMAL HIGH (ref 1.7–7.7)
Neutrophils Relative %: 92 %
Platelets: 276 10*3/uL (ref 150–400)
RBC: 3.86 MIL/uL — ABNORMAL LOW (ref 4.22–5.81)
RDW: 15 % (ref 11.5–15.5)
WBC: 15.9 10*3/uL — ABNORMAL HIGH (ref 4.0–10.5)
nRBC: 0 % (ref 0.0–0.2)

## 2021-08-13 LAB — BRAIN NATRIURETIC PEPTIDE: B Natriuretic Peptide: 187.1 pg/mL — ABNORMAL HIGH (ref 0.0–100.0)

## 2021-08-13 LAB — MAGNESIUM: Magnesium: 2.3 mg/dL (ref 1.7–2.4)

## 2021-08-13 LAB — C-REACTIVE PROTEIN: CRP: 5.4 mg/dL — ABNORMAL HIGH (ref <1.0)

## 2021-08-13 LAB — PROCALCITONIN: Procalcitonin: <0.1 ng/mL

## 2021-08-13 LAB — CYCLIC CITRUL PEPTIDE ANTIBODY, IGG/IGA: CCP Antibodies IgG/IgA: >250 units — ABNORMAL HIGH (ref 0–19)

## 2021-08-13 MED ORDER — FUROSEMIDE 10 MG/ML IJ SOLN
20.0000 mg | Freq: Once | INTRAMUSCULAR | Status: AC
  Administered 2021-08-13: 20 mg via INTRAVENOUS
  Filled 2021-08-13: qty 2

## 2021-08-13 MED ORDER — DAPAGLIFLOZIN PROPANEDIOL 10 MG PO TABS
10.0000 mg | ORAL_TABLET | Freq: Every day | ORAL | Status: DC
  Administered 2021-08-13 – 2021-08-14 (×2): 10 mg via ORAL
  Filled 2021-08-13 (×2): qty 1

## 2021-08-13 MED ORDER — ASPIRIN EC 81 MG PO TBEC
81.0000 mg | DELAYED_RELEASE_TABLET | Freq: Every day | ORAL | Status: DC
  Administered 2021-08-13 – 2021-08-14 (×2): 81 mg via ORAL
  Filled 2021-08-13 (×2): qty 1

## 2021-08-13 MED ORDER — METHYLPREDNISOLONE SODIUM SUCC 40 MG IJ SOLR
40.0000 mg | Freq: Every day | INTRAMUSCULAR | Status: DC
  Administered 2021-08-14: 40 mg via INTRAVENOUS
  Filled 2021-08-13: qty 1

## 2021-08-13 NOTE — Progress Notes (Signed)
Physical Therapy Treatment ?Patient Details ?Name: [name] ?MRN: [id number] ?DOB: 1940/06/28 ?Today's Date: 08/13/2021 ? ? ?History of Present Illness 81 y/o male presented to ED on 08/10/21 for low spO2 and SOB on exertion. CXR showed persistent pulmonary fibrosis. Admitted for acute on chronic respiratory failure/COPD exacerbation. PMH: COPD on 2-3L, RA, CKD stage IIIb, HTN, OSA ? ?  ?PT Comments  ? ? Patient progressing slowly towards PT goals. Session focused on therapeutic exercise and functional mobility as pt had just finished walking with nurse tech prior to arrival. Performed 5xSTS in 29.2 seconds demonstrating decreased functional strength, impaired balance and fall risk. Sp02 dropped to 80% on 4L/min 02 [place] during 5xSTS test, took a few mins to recover to >90% with cues for pursed lip breathing, 3-4/4 DOE. Able to perform marching in standing and worked on breathing simultaneously. Reviewed energy conservation techniques and importance of short bouts of activity with longer rest breaks. Encouraged continued mobility while in the hospital. Will follow. ?   ?Recommendations for follow up therapy are one component of a multi-disciplinary discharge planning process, led by the attending physician.  Recommendations may be updated based on patient status, additional functional criteria and insurance authorization. ? ?Follow Up Recommendations ? Home health PT ?  ?  ?Assistance Recommended at Discharge Intermittent Supervision/Assistance  ?Patient can return home with the following Assistance with cooking/housework;A little help with bathing/dressing/bathroom;Assist for transportation;Help with stairs or ramp for entrance ?  ?Equipment Recommendations ? None recommended by PT  ?  ?Recommendations for Other Services   ? ? ?  ?Precautions / Restrictions Precautions ?Precautions: Fall;Other (comment) ?Precaution Comments: watch O2 ?Restrictions ?Weight Bearing Restrictions: No  ?  ? ?Mobility ? Bed Mobility ?  ?   ?  ?  ?  ?  ?  ?General bed mobility comments: Sitting in chair upon PT arrival. ?  ? ?Transfers ?Overall transfer level: Needs assistance ?Equipment used: None ?Transfers: Sit to/from Stand ?Sit to Stand: Modified independent (Device/Increase time) ?  ?  ?  ?  ?  ?General transfer comment: Stood from chair x7 ?  ? ?Ambulation/Gait ?  ?  ?  ?  ?  ?  ?  ?General Gait Details: Pt just finished walking with nurse tech. ? ? ?Stairs ?  ?  ?  ?  ?  ? ? ?Wheelchair Mobility ?  ? ?Modified Rankin (Stroke Patients Only) ?  ? ? ?  ?Balance Overall balance assessment: Needs assistance ?Sitting-balance support: Feet supported, No upper extremity supported ?Sitting balance-Leahy Scale: Good ?  ?  ?Standing balance support: During functional activity ?Standing balance-Leahy Scale: Fair ?  ?  ?  ?  ?  ?  ?  ?  ?  ?  ?  ?  ?  ? ?  ?Cognition Arousal/Alertness: Awake/alert ?Behavior During Therapy: [facility] for tasks assessed/performed ?Overall Cognitive Status: Within Functional Limits for tasks assessed ?  ?  ?  ?  ?  ?  ?  ?  ?  ?  ?  ?  ?  ?  ?  ?  ?  ?  ?  ? ?  ?Exercises Other Exercises ?Other Exercises: 5xSTS test performed in 29.2 sec (recurrent falls >15 sec) ?Other Exercises: Marching in place x15 seconds while performing pursed lip breathing ? ?  ?General Comments General comments (skin integrity, edema, etc.): Wife and daughter arrived at end of session. Sp02 dropped to 80% on 4L/min 02 [place] during activity. Increased to 6L during marching exercie  and able to maintain in high 80s. ?  ?  ? ?Pertinent Vitals/Pain Pain Assessment ?Pain Assessment: No/denies pain  ? ? ?Home Living   ?  ?  ?  ?  ?  ?  ?  ?  ?  ?   ?  ?Prior Function    ?  ?  ?   ? ?PT Goals (current goals can now be found in the care plan section) Progress towards PT goals: Progressing toward goals ? ?  ?Frequency ? ? ? Min 3X/week ? ? ? ?  ?PT Plan Current plan remains appropriate  ? ? ?Co-evaluation   ?  ?  ?  ?  ? ?  ?AM-PAC PT "6 Clicks" Mobility   ?Outcome  Measure ? Help needed turning from your back to your side while in a flat bed without using bedrails?: A Little ?Help needed moving from lying on your back to sitting on the side of a flat bed without using bedrails?: A Little ?Help needed moving to and from a bed to a chair (including a wheelchair)?: A Little ?Help needed standing up from a chair using your arms (e.g., wheelchair or bedside chair)?: None ?Help needed to walk in hospital room?: A Little ?Help needed climbing 3-5 steps with a railing? : A Little ?6 Click Score: 19 ? ?  ?End of Session Equipment Utilized During Treatment: Oxygen ?Activity Tolerance: Treatment limited secondary to medical complications (Comment) (drop in Sp02) ?Patient left: in chair;with call bell/phone within reach;with chair alarm set;with family/visitor present ?Nurse Communication: Mobility status ?PT Visit Diagnosis: Muscle weakness (generalized) (M62.81);Other (comment) (SOB) ?  ? ? ?Time: 6811-5726 ?PT Time Calculation (min) (ACUTE ONLY): 20 min ? ?Charges:  $Therapeutic Exercise: 8-22 mins          ?          ? ?Marisa Severin, PT, DPT ?Acute Rehabilitation Services ?Secure chat preferred ?Office [phone] ? ? ? ? ? ?[place] ?08/13/2021, 1:25 PM ? ?

## 2021-08-13 NOTE — Progress Notes (Signed)
Modified Barium Swallow Progress Note ? ?Patient Details  ?Name: [name] ?MRN: [id number] ?Date of Birth: [birth year] ? ?Today's Date: 08/13/2021 ? ?Modified Barium Swallow completed.  Full report located under Chart Review in the Imaging Section. ? ?Brief recommendations include the following: ? ?Clinical Impression ? Pt's oropharyngeal swallowing is WFL. No aspiration occurred throughout the study even when challenged with larger, consecutive boluses. Pt does endorse a h/o esophageal dysphagia, stating that he has had to have his esophagus stretched in the past. SLP reviewed general aspiration precautions, emphasizing the importance of using precaution especially when short of breath. However, if aspiration is still suspected, would consider a more dedicated test of the esophagus given his hx. SLP to sign off acutely. ?  ?Swallow Evaluation Recommendations ? ? Recommended Consults: Consider esophageal assessment ? ? SLP Diet Recommendations: Regular solids;Thin liquid ? ? Liquid Administration via: Cup;Straw ? ? Medication Administration: Whole meds with liquid ? ? Supervision: Patient able to self feed ? ? Compensations: Slow rate;Small sips/bites;Other (Comment) (take breaks PRN for respiratory status) ? ? Postural Changes: Seated upright at 90 degrees;Remain semi-upright after after feeds/meals (Comment) ? ? Oral Care Recommendations: Oral care BID ? ?   ? ? ? ?Osie Bond., M.A. CCC-SLP ?Acute Rehabilitation Services ?Office [phone] ? ?Secure chat preferred ? ?08/13/2021,4:46 PM ?

## 2021-08-13 NOTE — Progress Notes (Signed)
?                                  PROGRESS NOTE                                             ?                                                                                                                     ?                                         ? ? Patient Demographics:  ? ? [name], is a 81 y.o. male, DOB - [birth year], ACZ:[id number] ? ?Outpatient Primary MD for the patient is [name], MD    LOS - 3  Admit date - 08/10/2021   ? ?Chief Complaint  ?Patient presents with  ? Shortness of Breath  ?    ? ?Brief Narrative (HPI from H&P)    81 y.o. male with medical history significant for interstitial lung disease/UIP, chronic respiratory failure with hypoxia on 2 L O2 via [place], rheumatoid arthritis on Imuran, CKD stage IIIb, HTN, GERD, OSA on CPAP who presented to the ED for evaluation of dyspnea with hypoxia.  He was diagnosed with acute on chronic hypoxic respiratory failure due to ILD exacerbation of note at baseline he uses 3 L nasal cannula oxygen at home and in the ER on 3 L he was saturating around 65%, he was placed on CPAP to keep his pulse ox over 90% in the ER. ? ? Subjective:  ? ?Patient in bed, appears comfortable, denies any headache, no fever, no chest pain or pressure, improved shortness of breath , no abdominal pain. No new focal weakness. ? ? Assessment  & Plan :  ? ? ?Acute on chronic respiratory failure with hypoxia  due to ILD exacerbation -  Presenting with worsening exertional dyspnea, cannot rule out superimposed bacterial pneumonia.  At this time he has been placed on moderate dose IV steroids along with empiric IV azithromycin and Rocephin.  Continue nebulizer treatments and supplemental oxygen.  Pulmonary critical care has been consulted as well.  We will also continue home medication Ofev (just started 4/13) once daily x1wk then BID, clinically improved. ? ?Essential hypertension - Continue home dose amlodipine. ? ?CKD  stage G3b/A1, GFR 30-44 and albumin creatinine ratio <30 mg/g (HCC) - Stable, continue to monitor.  Baseline creatinine appears to be close to 1.5. ? ?OSA (obstructive sleep apnea) - Continue CPAP nightly.  Patient states he has not been using recently since started on supplemental O2 via [place].  Will try and use CPAP at night. ? ?Rheumatoid arthritis ([place]) -  Recently switched from chronic methotrexate to Imuran.  Hold for now.  ? ?Acute on chronic diastolic CHF night of 0/86/7619.  Recent EF over 70%.  Given IV Lasix on 08/12/2021 with improvement will repeat another dose on 08/13/2021 and monitor. ? ?   ? ?Condition - Extremely Guarded ? ?Family Communication  : Wife [name] [phone] on 08/11/2021 ? ?Code Status :  Full ? ?Consults  :  PCCM ? ?PUD Prophylaxis : PPI ? ? Procedures  :    ? ?  ? ?   ? ?Disposition Plan  :   ? ?Status is: Inpatient ? ?DVT Prophylaxis  :   ? ?enoxaparin (LOVENOX) injection 40 mg Start: 08/10/21 2200 ?  ? ?Lab Results  ?Component Value Date  ? PLT 276 08/13/2021  ? ? ?Diet :  ?Diet Order   ? ?       ?  Diet renal with fluid restriction Fluid restriction: 1200 mL Fluid; Room service appropriate? Yes; Fluid consistency: Thin  Diet effective now       ?  ? ?  ?  ? ?  ?  ? ?Inpatient Medications ? ?Scheduled Meds: ? amLODipine  10 mg Oral Daily  ? aspirin EC  81 mg Oral Daily  ? atorvastatin  10 mg Oral Daily  ? azithromycin  500 mg Oral Daily  ? dapagliflozin propanediol  10 mg Oral Daily  ? enoxaparin (LOVENOX) injection  40 mg Subcutaneous Q24H  ? fluticasone  2 spray Each Nare Daily  ? furosemide  20 mg Intravenous Once  ? guaiFENesin  600 mg Oral BID  ? ipratropium-albuterol  3 mL Nebulization Q6H  ? methylPREDNISolone (SOLU-MEDROL) injection  40 mg Intravenous Q12H  ? montelukast  10 mg Oral QHS  ? Nintedanib  150 mg Oral Daily  ? [START ON 08/17/2021] Nintedanib  150 mg Oral BID  ? pantoprazole  40 mg Oral Daily  ? ?Continuous Infusions: ? cefTRIAXone (ROCEPHIN)  IV Stopped (08/12/21  2021)  ? ?PRN Meds:.acetaminophen **OR** acetaminophen, albuterol, menthol-cetylpyridinium, ondansetron **OR** ondansetron (ZOFRAN) IV, senna-docusate ? ?Antibiotics  :   ? ?Anti-infectives (From admission, onward)  ? ? Start     Dose/Rate Route Frequency Ordered Stop  ? 08/11/21 2000  cefTRIAXone (ROCEPHIN) 2 g in sodium chloride 0.9 % 100 mL IVPB       ? 2 g ?200 mL/hr over 30 Minutes Intravenous Every 24 hours 08/10/21 2110    ? 08/11/21 1930  azithromycin (ZITHROMAX) tablet 500 mg       ? 500 mg Oral Daily 08/11/21 1116    ? 08/10/21 1930  cefTRIAXone (ROCEPHIN) 2 g in sodium chloride 0.9 % 100 mL IVPB       ? 2 g ?200 mL/hr over 30 Minutes Intravenous  Once 08/10/21 1917 08/10/21 1955  ? 08/10/21 1930  azithromycin (ZITHROMAX) 500 mg in sodium chloride 0.9 % 250 mL IVPB  Status:  Discontinued       ? 500 mg ?250 mL/hr over 60 Minutes Intravenous Every 24 hours 08/10/21 1917 08/11/21 1116  ? ?  ? ? ? Time Spent in minutes  30 ? ? ?Lala Lund M.D on 08/13/2021 at 9:08 AM ? ?To page go to [email or web address]  ? ?[facility] -  Office  [phone] ? ?See all Orders from today for further details ? ? ? Objective:  ? ?Vitals:  ? 08/13/21 0800 08/13/21 0810 08/13/21 0841 08/13/21 0850  ?BP: 119/78  119/78   ?Pulse: 88  (!) 102   ?  Resp: 16  19   ?Temp:   98 ?F (36.7 ?C)   ?TempSrc:   Oral   ?SpO2: 95% 98% 90% 92%  ?Weight:      ?Height:      ? ? ?Wt Readings from Last 3 Encounters:  ?08/11/21 87.5 kg  ?06/29/21 92 kg  ?06/25/21 90.3 kg  ? ? ? ?Intake/Output Summary (Last 24 hours) at 08/13/2021 0908 ?Last data filed at 08/13/2021 [phone] ?Gross per 24 hour  ?Intake 875.47 ml  ?Output 3100 ml  ?Net -2224.53 ml  ? ? ? ?Physical Exam ? ?Awake Alert, No new F.N deficits, Normal affect ?[place].AT,PERRAL ?Supple Neck, No JVD,   ?Symmetrical Chest wall movement, Good air movement bilaterally, minimal rales ?RRR,No Gallops, Rubs or new Murmurs,  ?+ve B.Sounds, Abd Soft, No tenderness,   ?No Cyanosis, Clubbing or edema  ? ? ? Data  Review:  ? ? ?CBC ?Recent Labs  ?Lab 08/10/21 ?1825 08/11/21 ?0108 08/12/21 ?0150 08/13/21 ?7353  ?WBC 10.9* 9.8 17.5* 15.9*  ?HGB 12.4* 12.8* 11.6* 11.6*  ?HCT 38.4* 38.0* 35.2* 36.1*  ?PLT 264 258 259 276  ?MCV 93.7 91.1 91.7 93.5  ?MCH 30.2 30.7 30.2 30.1  ?MCHC 32.3 33.7 33.0 32.1  ?RDW 14.8 14.6 14.7 15.0  ?LYMPHSABS  --   --  0.6* 0.6*  ?MONOABS  --   --  0.5 0.5  ?EOSABS  --   --  0.0 0.0  ?BASOSABS  --   --  0.0 0.0  ? ? ?Electrolytes ?Recent Labs  ?Lab 08/10/21 ?1825 08/11/21 ?0108 08/12/21 ?0150 08/13/21 ?2992  ?NA 137 136 134* 134*  ?K 5.2* 4.7 5.0 4.7  ?CL 98 103 101 103  ?CO2 '25 24 24 23  '$ ?GLUCOSE 103* 178* 151* 137*  ?BUN 27* 26* 34* 36*  ?CREATININE 1.45* 1.40* 1.48* 1.47*  ?CALCIUM 9.7 9.2 8.9 8.7*  ?AST 25  --  21 23  ?ALT 14  --  15 20  ?ALKPHOS 45  --  40 39  ?BILITOT 0.8  --  0.4 0.3  ?ALBUMIN 3.2*  --  2.8* 2.7*  ?MG  --   --  2.4 2.3  ?CRP  --  17.1* 12.5* 5.4*  ?PROCALCITON  --  <0.10 0.10 <0.10  ?BNP  --   --  59.9 187.1*  ? ? ?------------------------------------------------------------------------------------------------------------------ ?No results for input(s): CHOL, HDL, LDLCALC, TRIG, CHOLHDL, LDLDIRECT in the last 72 hours. ? ?No results found for: HGBA1C ? ?No results for input(s): TSH, T4TOTAL, T3FREE, THYROIDAB in the last 72 hours. ? ?Invalid input(s): FREET3 ?------------------------------------------------------------------------------------------------------------------ ?ID Labs ?Recent Labs  ?Lab 08/10/21 ?1825 08/11/21 ?0108 08/12/21 ?0150 08/13/21 ?4268  ?WBC 10.9* 9.8 17.5* 15.9*  ?PLT 264 258 259 276  ?CRP  --  17.1* 12.5* 5.4*  ?PROCALCITON  --  <0.10 0.10 <0.10  ?CREATININE 1.45* 1.40* 1.48* 1.47*  ? ?Cardiac Enzymes ?No results for input(s): CKMB, TROPONINI, MYOGLOBIN in the last 168 hours. ? ?Invalid input(s): CK ? ?Radiology Reports ?DG Chest Port 1 View ? ?Result Date: 08/10/2021 ?CLINICAL DATA:  Shortness of breath EXAM: PORTABLE CHEST 1 VIEW COMPARISON:  06/22/2021  FINDINGS: Single frontal view of the chest demonstrates a stable cardiac silhouette. Persistent findings of pulmonary fibrosis. There is patchy multifocal ground-glass consolidation, most pronounced in the

## 2021-08-13 NOTE — TOC Initial Note (Signed)
Transition of Care (TOC) - Initial/Assessment Note  ? ? ?Patient Details  ?Name: [name] ?MRN: [id number] ?Date of Birth: 22-Jan-1941 ? ?Transition of Care (TOC) CM/SW Contact:    ?Cyndi Bender, RN ?Phone Number: ?08/13/2021, 4:18 PM ? ?Clinical Narrative:           ?Spoke to patient regarding transition needs. PT recommends Home health. Patient defers to [facility] to find a highly rated agency. Cory with [facility] accepted referral. Patient has adapt for home 02 at 2L. Patient lives with his wife.  ?TOC will continue to follow for needs ? ?Will need home health pt orders ? ?Expected Discharge Plan: [place] ?Barriers to Discharge: Continued Medical Work up ? ? ?Patient Goals and CMS Choice ?Patient states their goals for this hospitalization and ongoing recovery are:: return home ?CMS Medicare.gov Compare Post Acute Care list provided to:: Patient ?Choice offered to / list presented to : Patient ? ?Expected Discharge Plan and Services ?Expected Discharge Plan: [place] ?  ?Discharge Planning Services: CM Consult ?Post Acute Care Choice: Home Health ?Living arrangements for the past 2 months: [place] ?                ?  ?  ?  ?  ?  ?HH Arranged: PT ?[place] Agency: [place] ?Date HH Agency Contacted: 08/13/21 ?[facility] [place]: [phone] ?Representative spoke with at [place]: Tommi Rumps ? ?Prior Living Arrangements/Services ?Living arrangements for the past 2 months: [place] ?Lives with:: Spouse ?Patient language and need for interpreter reviewed:: Yes ?Do you feel safe going back to the place where you live?: Yes      ?Need for Family Participation in Patient Care: Yes (Comment) ?Care giver support system in place?: Yes (comment) ?Current home services: DME (walker, home 02) ?Criminal Activity/Legal Involvement Pertinent to Current Situation/Hospitalization: No - Comment as needed ? ?Activities of Daily Living ?Home Assistive Devices/Equipment:  None ?ADL Screening (condition at time of admission) ?Patient's cognitive ability adequate to safely complete daily activities?: Yes ?Is the patient deaf or have difficulty hearing?: Yes ?Does the patient have difficulty seeing, even when wearing glasses/contacts?: No (doesn't have glasses with him) ?Does the patient have difficulty concentrating, remembering, or making decisions?: No ?Patient able to express need for assistance with ADLs?: Yes ?Does the patient have difficulty dressing or bathing?: Yes (increased SOB. Here at [facility] has multiple equipment) ?Independently performs ADLs?: No (needs assist with amb, transferring, and dressing) ?Communication: Independent ?Dressing (OT): Needs assistance ?Is this a change from baseline?: Change from baseline, expected to last <3days (increased SOB) ?Grooming: Independent ?Feeding: Independent ?Bathing: Needs assistance ?Is this a change from baseline?: Change from baseline, expected to last <3 days (increased SOB) ?Toileting: Needs assistance ?Is this a change from baseline?: Change from baseline, expected to last <3 days (transferring and ambulation) ?In/Out Bed: Needs assistance (transferring and amb r/t unsteady gait and increased SOB) ?Is this a change from baseline?: Pre-admission baseline (for the last couple of days) ?Walks in Home: Needs assistance ?Is this a change from baseline?: Pre-admission baseline (for last couple of days r/t increased SOB and unsteady gait) ?Does the patient have difficulty walking or climbing stairs?: Yes (r/t increased SOB with minimal exertion and unsteady gait) ?Weakness of Legs: Both ?Weakness of Arms/Hands: None ? ?Permission Sought/Granted ?Permission sought to share information with : Case Manager ?Permission granted to share information with : Yes, Verbal Permission Granted ?   ?  Permission granted to share info w AGENCY: HH ?   ?   ? ?Emotional Assessment ?Appearance:: Appears stated age ?Attitude/Demeanor/Rapport:  Engaged ?Affect (typically observed): Accepting ?Orientation: : Oriented to Self, Oriented to Place, Oriented to  Time, Oriented to Situation ?Alcohol / Substance Use: Not Applicable ?Psych Involvement: No (comment) ? ?Admission diagnosis:  COPD exacerbation ([place]) [J44.1] ?Acute respiratory failure with hypoxia ([place]) [J96.01] ?Acute on chronic respiratory failure with hypoxia (HCC) [J96.21] ?Community acquired pneumonia, unspecified laterality [J18.9] ?Patient Active Problem List  ? Diagnosis Date Noted  ? Acute on chronic diastolic CHF (congestive heart failure) ([place]) 08/13/2021  ? Acute on chronic respiratory failure with hypoxia ([place]) 08/10/2021  ? Hyperkalemia 08/10/2021  ? CKD stage G3b/A1, GFR 30-44 and albumin creatinine ratio <30 mg/g (HCC) 06/23/2021  ? Essential hypertension 06/23/2021  ? Acute respiratory failure with hypoxia ([place]) 06/22/2021  ? Seasonal and perennial allergic rhinitis 08/30/2020  ? Seasonal allergic conjunctivitis 08/30/2020  ? ILD (interstitial lung disease) ([place]) 08/30/2020  ? PNA (pneumonia) 01/07/2020  ? OSA (obstructive sleep apnea) 01/07/2020  ? History of pneumothorax 08/04/2019  ? Postop check 06/30/2019  ? Recurrent pneumothorax 06/14/2019  ? Pneumothorax on right 05/28/2019  ? Normocytic anemia 12/01/2014  ? FLATULENCE-GAS-BLOATING 10/03/2009  ? Rheumatoid arthritis ([place]) 06/25/2007  ? HEMORRHOIDS 12/24/2005  ? ESOPHAGEAL STRICTURE 12/24/2005  ? GERD 12/24/2005  ? HIATAL HERNIA 12/24/2005  ? DIVERTICULOSIS, COLON 12/24/2005  ? ESOPHAGITIS, REFLUX 02/18/2001  ? COLONIC POLYPS, HYPERPLASTIC 02/10/2001  ? ?PCP:  [name], MD ?Pharmacy:   ?[id number] [place] [id number]?Phone: [phone]: [phone]? ?[place] - [address] ?[address] ?[id number]?Phone: [phone]: [phone]? ? ? ? ?Social Determinants of Health (SDOH)  Interventions ?  ? ?Readmission Risk Interventions ?   ? View : No data to display.  ?  ?  ?  ? ? ? ?

## 2021-08-13 NOTE — Progress Notes (Signed)
? ?NAME:  Ehab Humber, MRN:  [id number], DOB:  [birth year], LOS: 3 ?ADMISSION DATE:  08/10/2021, CONSULTATION DATE:  08/11/21 ?REFERRING MD:  Dr. Candiss Norse, CHIEF COMPLAINT:  Dyspnea  ? ?History of Present Illness:  ?81 year old male with prior history of ILD/ UIP, RA, chronic hypoxic respiratory failure on HOT 2-4L, OSA on CPAP, CKD stage IIIb, HTN, and GERD who presented on 4/14 with progressive dyspnea, productive cough, and hypoxia.  ? ?Patient recently hospitalized 2/24 to 06/25/21 with ILD exacerbation and possible CAP treated with antibiotics and steroid taper discharged home with home oxygen, which was new for him.   He is followed in our clinic by Dr. Chase Caller, seen 3/3.  He was switched from methotrexate to Imuran afterwards and recently started on Ofev on 4/13, thus far tolerating with mild nausea.  Most recent HRCT 07/30/21 showed progression of fibrosis with new upper lobe predominant GGO.  He reports overall been doing well but over the last three weeks with progressive exertional dyspnea and increased O2 needs at rest from 2-4L.  Reports same productive cough, yellowish, but now with more of it.  Denies fever, chills, hemoptysis, choking episodes, or recent sick contacts.  Does endorse some sore throat and runny nose-  mostly in the morning which resolves throughout the day.  Of note, he has not been wearing his CPAP at home since going on home oxygen as he was not sure how to combine the two.  Yesterday, he reported that his O2 sats were in the 60's on 4L and was advised by our office to go to ER for evaluation.  ? ?This admit, he has been afebrile, hemodynamically stable with labs noted for normal WBC, but CRP up from 15.3 in February to 17.1 and sed rate 103 then now 94.  CXR shows increased upper lobe predominant GGO, more so in RUL.  He was started on azithro, ceftriaxone, nebs, and steroids and admitted to [facility].  He since has been on 3-5L [place] and wore CPAP overnight.  Pulmonary consulted for further  input.  ? ?Pertinent  Medical History  ?ILD/ UIP on Ofev, RA on Imuran, chronic hypoxic respiratory failure on HOT 2-4L, OSA on CPAP, CKDIIIb, GERD, HFpEF ? ?Significant Hospital Events: ?Including procedures, antibiotic start and stop dates in addition to other pertinent events   ?4/14 admitted to [facility] ?4/15 pulmonary consult ? ?Interim History / Subjective:  ?On 3L [place]  ?Pt reports significant diarrhea, hesitant to take OFEV ?Afebrile  ? ?Objective   ?Blood pressure 130/67, pulse 85, temperature 98.2 ?F (36.8 ?C), temperature source Oral, resp. rate 20, height '5\' 8"'$  (1.727 m), weight 87.5 kg, SpO2 99 %. ?   ?   ? ?Intake/Output Summary (Last 24 hours) at 08/13/2021 0800 ?Last data filed at 08/13/2021 0500 ?Gross per 24 hour  ?Intake 1235.47 ml  ?Output 1100 ml  ?Net 135.47 ml  ? ?Filed Weights  ? 08/10/21 1705 08/11/21 0059  ?Weight: 90.3 kg 87.5 kg  ? ?Examination: ?General: adult male lying in bed in NAD  ?HEENT: MM pink/moist, anicteric, [place] O2 ?Neuro: AAOx4, speech clear, MAE ?CV: s1s2 RRR, no m/r/g ?PULM: non-labored at rest, no WOB with conversation, lungs clear anterior, minimal crackles posterior ?GI: soft, bsx4 active  ?Extremities: warm/dry, no edema  ?Skin: no rashes or lesions ? ?Resolved Hospital Problem list   ? ?Assessment & Plan:  ? ?ILD/ UIP exacerbation; no evidence of viral or bacterial pneumonia ?Acute on chronic hypoxic respiratory failure ?OSA ?Question if exacerbation of  is RA-ILD from recent change from methotrexate to azathioprine ?-reduce steroids to 40 mg QD, plan to keep on 40 mg QD until seen by Dr. Chase Caller on 4/20  ?-hold OFEV until seen by Dr. Chase Caller, increased diarrhea burden  ?-follow up CCP, note elevated RF ?-wean O2, nearing baseline ?-complete 5-7 days abx ?-PPI  ?-Duoneb Q6 ?-nocturnal CPAP  ?-ambulate to ensure not need for change in O2  ?-PRN lasix for volume  ?-ok to resume azathioprine  ?-patient has planned follow up in clinic with Dr. Chase Caller on 4/20 ? ?Best Practice  (right click and "Reselect all SmartList Selections" daily)  ?Diet/type: Regular consistency (see orders) ?DVT prophylaxis: LMWH ?GI prophylaxis: PPI ?Lines: N/A ?Foley:  N/A ?Code Status:  full code ?Last date of multidisciplinary goals of care discussion: per primary  ? ?    ? ?Noe Gens, MSN, APRN, NP-C, AGACNP-BC ?[facility] ?08/13/2021, 8:00 AM ? ? ?Please see [email or web address] for pager details.  ? ?From 7A-7P if no response, please call [phone] ?After hours, please call ELink [phone] ? ?

## 2021-08-14 ENCOUNTER — Other Ambulatory Visit (HOSPITAL_COMMUNITY): Payer: Self-pay

## 2021-08-14 MED ORDER — FUROSEMIDE 10 MG/ML IJ SOLN
20.0000 mg | Freq: Once | INTRAMUSCULAR | Status: AC
  Administered 2021-08-14: 20 mg via INTRAVENOUS
  Filled 2021-08-14: qty 2

## 2021-08-14 MED ORDER — AZITHROMYCIN 500 MG PO TABS
500.0000 mg | ORAL_TABLET | Freq: Every day | ORAL | 0 refills | Status: AC
  Filled 2021-08-14: qty 2, 2d supply, fill #0

## 2021-08-14 MED ORDER — PREDNISONE 5 MG PO TABS
ORAL_TABLET | ORAL | 0 refills | Status: AC
  Filled 2021-08-14: qty 65, 18d supply, fill #0

## 2021-08-14 NOTE — Discharge Instructions (Signed)
Follow with Primary MD [name], MD in 7 days  ? ?Get CBC, CMP, 2 view Chest X ray -  checked next visit within 1 week by Primary MD   ? ?Activity: As tolerated with Full fall precautions use walker/cane & assistance as needed ? ?Disposition Home  ? ?Diet: Heart Healthy with 1.5 L/day total fluid restriction ? ?Special Instructions: If you have smoked or chewed Tobacco  in the last 2 yrs please stop smoking, stop any regular Alcohol  and or any Recreational drug use. ? ?On your next visit with your primary care physician please Get Medicines reviewed and adjusted. ? ?Please request your Prim.MD to go over all Hospital Tests and Procedure/Radiological results at the follow up, please get all Hospital records sent to your Prim MD by signing hospital release before you go home. ? ?If you experience worsening of your admission symptoms, develop shortness of breath, life threatening emergency, suicidal or homicidal thoughts you must seek medical attention immediately by calling 911 or calling your MD immediately  if symptoms less severe. ? ?You Must read complete instructions/literature along with all the possible adverse reactions/side effects for all the Medicines you take and that have been prescribed to you. Take any new Medicines after you have completely understood and accpet all the possible adverse reactions/side effects.  ? ?  ?

## 2021-08-14 NOTE — Care Management Important Message (Signed)
Important Message ? ?Patient Details  ?Name: [name] ?MRN: [id number] ?Date of Birth: 1940-08-19 ? ? ?Medicare Important Message Given:  Yes ? ? ? ? ?[name] ?08/14/2021, 3:12 PM ?

## 2021-08-14 NOTE — Progress Notes (Signed)
Wynelle Fanny to be D/C'd Home per MD order.  Discussed with the patient and all questions fully answered. ? ?VSS, Skin clean, dry and intact without evidence of skin break down, no evidence of skin tears noted. ?IV catheter discontinued intact. Site without signs and symptoms of complications. Dressing and pressure applied. ? ?An After Visit Summary was printed and given to the patient. Patient received prescription. ? ?D/c education completed with patient/family including follow up instructions, medication list, d/c activities limitations if indicated, with other d/c instructions as indicated by MD - patient able to verbalize understanding, all questions fully answered.  ? ?Patient instructed to return to ED, call 911, or call MD for any changes in condition.  ? ?Patient escorted via [place], and D/C home via private auto. ? ?[place] ?08/14/2021 12:18 PM  ?

## 2021-08-14 NOTE — Discharge Summary (Signed)
?                                                                                ? ?[name] QVZ:[id number] DOB: [birth year] DOA: 08/10/2021 ? ?PCP: [name], MD ? ?Admit date: 08/10/2021  Discharge date: 08/14/2021 ? ?Admitted From: Home   Disposition:  Home ? ? ?Recommendations for Outpatient Follow-up:  ? ?Follow up with PCP in 1-2 weeks ? ?PCP Please obtain BMP/CBC, 2 view CXR in 1week,  (see Discharge instructions)  ? ?PCP Please follow up on the following pending results: Monitor fluid status closely, if he develops any fluid overload please start him on scheduled diuretics. ? ? ?Home Health: None   ?Equipment/Devices: has Home o2 4lits  ?Consultations: Pulm ?Discharge Condition: Stable    ?CODE STATUS: Full    ?Diet Recommendation: Heart Healthy 1.5 L total fluid restriction per day ? ? ?Chief Complaint  ?Patient presents with  ? Shortness of Breath  ?  ? ?Brief history of present illness from the day of admission and additional interim summary   ? ?81 y.o. male with medical history significant for interstitial lung disease/UIP, chronic respiratory failure with hypoxia on 2 L O2 via [place], rheumatoid arthritis on Imuran, CKD stage IIIb, HTN, GERD, OSA on CPAP who presented to the ED for evaluation of dyspnea with hypoxia.  He was diagnosed with acute on chronic hypoxic respiratory failure due to ILD exacerbation of note at baseline he uses 3 L nasal cannula oxygen at home and in the ER on 3 L he was saturating around 65%, he was placed on CPAP to keep his pulse ox over 90% in the ER. ? ?                                                               Hospital Course  ? ? ?Acute on chronic respiratory failure with hypoxia  due to ILD exacerbation -  Presenting with worsening exertional dyspnea, cannot rule out superimposed bacterial pneumonia.  He was placed on IV steroids along with IV antibiotics, supportive care with  nebulizer treatments along with flutter valve and I-S for pulmonary toiletry, there was also a touch of CHF in his presentation.  With antibiotics, IV steroids and diuresis he has shown excellent improvement, seen by pulmonary cleared for home discharge.  Of note he uses 4 L of oxygen at home at rest at times when he walks he gets short of breath and has been asked to increase his oxygen up to 6 L when ambulating if he needs it, post discharge will follow with PCP and his primary pulmonologist within 1 to 2 weeks..  We will also continue home medication Ofev, he will get oral steroid taper along with 2 more days of oral antibiotics upon discharge. ?  ?Essential hypertension - Continue home regimen. ?  ?CKD stage G3b/A1, GFR 30-44 and albumin creatinine ratio <30 mg/g (HCC) - Stable, continue to monitor.  Baseline creatinine appears to be  close to 1.5. ?  ?OSA (obstructive sleep apnea) - Continue CPAP nightly.  Patient states he has not been using recently since started on supplemental O2 via [place].   Counseled on compliance with CPAP at home. ?  ?Rheumatoid arthritis ([place]) - Recently switched from chronic methotrexate to Imuran.  Continue home regimen post discharge. ?  ?Acute on chronic diastolic CHF night of 0/35/4656.  Recent EF over 70%.  Improved after 2 doses of IV Lasix, currently compensated, PCP to monitor fluid status and diuretic need closely post discharge. ? ? ?Discharge diagnosis   ? ? ?Principal Problem: ?  Acute on chronic respiratory failure with hypoxia ([place]) ?Active Problems: ?  ILD (interstitial lung disease) ([place]) ?  Hyperkalemia ?  Rheumatoid arthritis ([place]) ?  OSA (obstructive sleep apnea) ?  Acute respiratory failure with hypoxia ([place]) ?  CKD stage G3b/A1, GFR 30-44 and albumin creatinine ratio <30 mg/g (HCC) ?  Essential hypertension ?  Acute on chronic diastolic CHF (congestive heart failure) ([place]) ? ? ? ?Discharge instructions   ? ?Discharge Instructions   ? ? Discharge instructions    Complete by: As directed ?  ? Follow with Primary MD [name], MD in 7 days  ? ?Get CBC, CMP, 2 view Chest X ray -  checked next visit within 1 week by Primary MD   ? ?Activity: As tolerated with Full fall precautions use walker/cane & assistance as needed ? ?Disposition Home  ? ?Diet: Heart Healthy with 1.5 L/day total fluid restriction ? ?Special Instructions: If you have smoked or chewed Tobacco  in the last 2 yrs please stop smoking, stop any regular Alcohol  and or any Recreational drug use. ? ?On your next visit with your primary care physician please Get Medicines reviewed and adjusted. ? ?Please request your Prim.MD to go over all Hospital Tests and Procedure/Radiological results at the follow up, please get all Hospital records sent to your Prim MD by signing hospital release before you go home. ? ?If you experience worsening of your admission symptoms, develop shortness of breath, life threatening emergency, suicidal or homicidal thoughts you must seek medical attention immediately by calling 911 or calling your MD immediately  if symptoms less severe. ? ?You Must read complete instructions/literature along with all the possible adverse reactions/side effects for all the Medicines you take and that have been prescribed to you. Take any new Medicines after you have completely understood and accpet all the possible adverse reactions/side effects.  ? Increase activity slowly   Complete by: As directed ?  ? ?  ? ? ?Discharge Medications  ? ?Allergies as of 08/14/2021   ?No Known Allergies ?  ? ?  ?Medication List  ?  ? ?TAKE these medications   ? ?acetaminophen 500 MG tablet ?Commonly known as: TYLENOL ?Take 2 tablets (1,000 mg total) by mouth every 6 (six) hours. ?What changed:  ?when to take this ?reasons to take this ?  ?amLODipine 10 MG tablet ?Commonly known as: NORVASC ?Take 1 tablet (10 mg total) by mouth daily. ?  ?aspirin EC 81 MG tablet ?Take 81 mg by mouth daily. ?  ?atorvastatin 10 MG  tablet ?Commonly known as: LIPITOR ?Take 10 mg by mouth daily. ?  ?azaTHIOprine 50 MG tablet ?Commonly known as: IMURAN ?Take 50 mg by mouth daily. ?  ?azithromycin 500 MG tablet ?Commonly known as: ZITHROMAX ?Take 1 tablet (500 mg total) by mouth daily. ?  ?cetirizine 10 MG tablet ?Commonly known as: ZYRTEC ?Take 1  tablet (10 mg total) by mouth 2 (two) times daily as needed for allergies (Can take an extra dose during flare ups). Take 1 tablet 1-2 times daily as needed. ?  ?Farxiga 10 MG Tabs tablet ?Generic drug: dapagliflozin propanediol ?Take 10 mg by mouth daily. ?  ?fluticasone 50 MCG/ACT nasal spray ?Commonly known as: Flonase ?Place 2 sprays into both nostrils daily. ?  ?FOLIC ACID PO ?Take 1 tablet by mouth daily. ?  ?guaiFENesin 600 MG 12 hr tablet ?Commonly known as: [place] ?Take 1 tablet (600 mg total) by mouth 2 (two) times daily. ?  ?montelukast 10 MG tablet ?Commonly known as: SINGULAIR ?Take 1 tablet (10 mg total) by mouth at bedtime. ?  ?Muscle Rub 10-15 % Crea ?Apply 1 application. topically daily as needed for muscle pain (shoulder/neck). ?  ?Ofev 150 MG Caps ?Generic drug: Nintedanib ?Take 1 capsule by mouth once daily for one week then increase to 1 capsule by mouth twice daily thereafter. ?What changed:  ?how much to take ?how to take this ?when to take this ?  ?Olopatadine HCl 0.2 % Soln ?Commonly known as: Pataday ?Place 1 drop into both eyes daily. Use 1 drop in each eye once daily as needed. ?What changed: additional instructions ?  ?omeprazole 20 MG capsule ?Commonly known as: PRILOSEC ?TAKE 1 CAPSULE BY MOUTH EVERY DAY ?What changed: how much to take ?  ?predniSONE 5 MG tablet ?Commonly known as: DELTASONE ?Label  & dispense according to the schedule below. take 8 Pills PO for 3 days, 6 Pills PO for 3 days, 4 Pills PO for 3 days, 2 Pills PO for 3 days, 1 Pills PO for 3 days, 1/2 Pill  PO for 3 days then STOP. Total 65 pills. ?  ?valsartan-hydrochlorothiazide 320-25 MG tablet ?Commonly  known as: DIOVAN-HCT ?Take 1 tablet by mouth daily. ?  ? ?  ? ? ? Follow-up Information   ? ? [name], MD. Schedule an appointment as soon as possible for a visit in 1 week(s).   ?Specialty: Family Medicine ?Contact

## 2021-08-14 NOTE — Progress Notes (Signed)
Pt refused CPAP for tonight.  

## 2021-08-14 NOTE — Care Management Important Message (Signed)
Important Message ? ?Patient Details  ?Name: [name] ?MRN: [id number] ?Date of Birth: January 30, 1941 ? ? ?Medicare Important Message Given:  Yes ? ?Patient discharged prior to IM delivery will mail the IM to the patient home address.  ? ? ?[name] ?08/14/2021, 3:01 PM ?

## 2021-08-16 ENCOUNTER — Ambulatory Visit: Payer: Medicare Other | Admitting: Internal Medicine

## 2021-08-16 ENCOUNTER — Telehealth: Payer: Self-pay | Admitting: Internal Medicine

## 2021-08-16 NOTE — Telephone Encounter (Signed)
?  Tammy ? ?You are seeing him in few days . He got hospitalized. Seems volume overload. PFT declined but ? Volume effect v progressive ILD ? ?Plan ? - please assess his o2 status relative to baseline  ?- copying Leda Gauze of [employer] to see if he qualifies/interested in ILD PRO registry ?- ensure immuran/ofev are going well ?-  ? ? ? ?  Latest Ref Rng & Units 08/03/2021  ? 11:01 AM 12/29/2020  ?  2:10 PM  ?PFT Results  ?FVC-Pre L 1.67  P 2.96    ?FVC-Predicted Pre % 48  P 86    ?Pre FEV1/FVC % % 56  P 64    ?FEV1-Pre L 0.94  P 1.91    ?FEV1-Predicted Pre % 38  P 77    ?DLCO uncorrected ml/min/mmHg 3.22  P 13.19    ?DLCO UNC% % 13  P 56    ?DLCO corrected ml/min/mmHg 3.40  P 13.19    ?DLCO COR %Predicted % 14  P 56    ?DLVA Predicted % 45  P 90    ?  ?P Preliminary result  ? ? ?

## 2021-08-20 ENCOUNTER — Ambulatory Visit (INDEPENDENT_AMBULATORY_CARE_PROVIDER_SITE_OTHER): Payer: Medicare Other | Admitting: Adult Health

## 2021-08-20 ENCOUNTER — Encounter: Payer: Self-pay | Admitting: Adult Health

## 2021-08-20 VITALS — BP 124/58 | HR 101 | Temp 97.7°F | Ht 68.0 in | Wt 194.6 lb

## 2021-08-20 DIAGNOSIS — J849 Interstitial pulmonary disease, unspecified: Secondary | ICD-10-CM | POA: Diagnosis not present

## 2021-08-20 DIAGNOSIS — R5381 Other malaise: Secondary | ICD-10-CM

## 2021-08-20 DIAGNOSIS — J9611 Chronic respiratory failure with hypoxia: Secondary | ICD-10-CM

## 2021-08-20 DIAGNOSIS — G4733 Obstructive sleep apnea (adult) (pediatric): Secondary | ICD-10-CM | POA: Diagnosis not present

## 2021-08-20 DIAGNOSIS — J8489 Other specified interstitial pulmonary diseases: Secondary | ICD-10-CM | POA: Diagnosis not present

## 2021-08-20 DIAGNOSIS — M359 Systemic involvement of connective tissue, unspecified: Secondary | ICD-10-CM | POA: Diagnosis not present

## 2021-08-20 MED ORDER — ALBUTEROL SULFATE (2.5 MG/3ML) 0.083% IN NEBU
2.5000 mg | INHALATION_SOLUTION | Freq: Two times a day (BID) | RESPIRATORY_TRACT | 2 refills | Status: AC | PRN
Start: 2021-08-20 — End: ?

## 2021-08-20 NOTE — Progress Notes (Signed)
? ?'@Patient'$  ID: [name], male    DOB: [birth year], 81 y.o.   MRN: [id number] ? ?Chief Complaint  ?Patient presents with  ? Hospitalization Follow-up  ? ? ?Referring provider: ?[name], MD ? ?HPI: ?81 year old male followed for rheumatoid arthritis associated interstitial lung disease ?Medical history significant for pneumothorax ?Has obstructive sleep apnea ? ?TEST/EVENTS :  ?August 2021 Home sleep study showed mild obstructive sleep apnea with moderate oxygen desaturations ?  ?High-resolution CT chest September 30, 2019 showed chronic lung changes compatible with interstitial lung disease indicative of UIP.  No progression of disease compared to previous study. ?  ? ?08/20/2021 Follow up : RA-ILD , OSA , post hospital  ?Patient returns for a follow-up visit.  Patient was recently hospitalized earlier this month for an ILD exacerbation.  He was treated for possible superimposed pneumonia with IV antibiotics, steroids and nebulized bronchodilators.  He also required some diuresis for suspected volume overload.  He had increased oxygen demands.  Had to increase his oxygen up to 6 L.  Patient also was started on Ofev during hospitalization however patient says he absolutely cannot take this.  Only took it for 3 days and made him feel deathly sick.  Patient says he does not wish to restart this.  I did talk to him about a drug holiday and lower dosing.  However he says absolutely not.  We did talk about alternative antifibrotic with Esbriet and he says no to this at this time.  Patient says he is just now starting to get back to his health.  He lost about 8 pounds due to GI issues. ?Patient is accompanied by family members.  Patient is becoming harder to take care off at home.  Has a hard time taking a shower due to shortness of breath and oxygen use.  Needs a shower chair.  Also is very difficult to get him to doctors appointments or any activities outside the home because he cannot walk for any distance due to  shortness of breath and oxygen requirements.  Patient also needs a wheelchair.  Patient says he would like to become more active in the home but is unsteady and has balance issues.  He needs a rolling walker with bench seat so he can rest often if he needs to and also is has trouble carrying his oxygen. ?He denies any hemoptysis, chest pain, orthopnea. ?Patient has gotten his oxygen back down to baseline where he is at 3 L at rest and 6 L with activity ?Patient is on CPAP.  Patient says he has had trouble getting his supplies.  We have contacted his DME to help facilitate this. ? ? ?No Known Allergies ? ?Immunization History  ?Administered Date(s) Administered  ? Influenza, High Dose Seasonal PF 01/31/2018, 12/15/2018, 12/28/2020  ? Influenza,inj,Quad PF,6+ Mos 01/24/2020  ? PFIZER(Purple Top)SARS-COV-2 Vaccination 05/21/2019, 06/24/2019, 04/10/2020  ? ? ?Past Medical History:  ?Diagnosis Date  ? Adenomatous colon polyp   ? Collapsed lung 04/2018  ? Diverticulosis   ? GERD (gastroesophageal reflux disease)   ? Hemorrhoids   ? internal and external  ? Hiatal hernia   ? Hypertension   ? ILD (interstitial lung disease) ([place])   ? OSA on CPAP   ? Pneumonia 11/2019  ? Rheumatoid arthritis ([place])   ? S/P dilatation of esophageal stricture   ? ? ?Tobacco History: ?Social History  ? ?Tobacco Use  ?Smoking Status Never  ? Passive exposure: Past  ?Smokeless Tobacco Never  ? ?Counseling given: Not  Answered ? ? ?Outpatient Medications Prior to Visit  ?Medication Sig Dispense Refill  ? acetaminophen (TYLENOL) 500 MG tablet Take 2 tablets (1,000 mg total) by mouth every 6 (six) hours. (Patient taking differently: Take 1,000 mg by mouth every 6 (six) hours as needed.) 30 tablet 0  ? amLODipine (NORVASC) 10 MG tablet Take 1 tablet (10 mg total) by mouth daily. 30 tablet 0  ? aspirin EC 81 MG tablet Take 81 mg by mouth daily.    ? atorvastatin (LIPITOR) 10 MG tablet Take 10 mg by mouth daily.    ? azaTHIOprine (IMURAN) 50 MG tablet  Take 50 mg by mouth daily.    ? cetirizine (ZYRTEC) 10 MG tablet Take 1 tablet (10 mg total) by mouth 2 (two) times daily as needed for allergies (Can take an extra dose during flare ups). Take 1 tablet 1-2 times daily as needed. 60 tablet 11  ? FARXIGA 10 MG TABS tablet Take 10 mg by mouth daily.    ? fluticasone (FLONASE) 50 MCG/ACT nasal spray Place 2 sprays into both nostrils daily. 16 g 11  ? FOLIC ACID PO Take 1 tablet by mouth daily.    ? guaiFENesin (MUCINEX) 600 MG 12 hr tablet Take 1 tablet (600 mg total) by mouth 2 (two) times daily. 30 tablet 0  ? Menthol-Methyl Salicylate (MUSCLE RUB) 10-15 % CREA Apply 1 application. topically daily as needed for muscle pain (shoulder/neck).    ? montelukast (SINGULAIR) 10 MG tablet Take 1 tablet (10 mg total) by mouth at bedtime. 90 tablet 4  ? Nintedanib (OFEV) 150 MG CAPS Take 1 capsule by mouth once daily for one week then increase to 1 capsule by mouth twice daily thereafter. (Patient taking differently: Take 150 mg by mouth See admin instructions. Take 1 capsule by mouth once daily for one week then increase to 1 capsule by mouth twice daily thereafter.) 60 capsule 5  ? Olopatadine HCl (PATADAY) 0.2 % SOLN Place 1 drop into both eyes daily. Use 1 drop in each eye once daily as needed. (Patient taking differently: Place 1 drop into both eyes daily.) 2.5 mL 11  ? omeprazole (PRILOSEC) 20 MG capsule TAKE 1 CAPSULE BY MOUTH EVERY DAY (Patient taking differently: Take 20 mg by mouth daily.) 90 capsule 3  ? predniSONE (DELTASONE) 5 MG tablet Take 8 Pills by mouth once daily for 3 days, then 6 Pills by mouth for 3 days, then 4 Pills by mouth for 3 days, then 2 Pills by mouth for 3 days, then 1 Pills by mouth for 3 days, then  1/2 Pill  by mouth for 3 days then STOP. 65 tablet 0  ? valsartan-hydrochlorothiazide (DIOVAN-HCT) 320-25 MG tablet Take 1 tablet by mouth daily.    ? azithromycin (ZITHROMAX) 500 MG tablet Take 1 tablet (500 mg total) by mouth daily. (Patient not  taking: Reported on 08/20/2021) 2 tablet 0  ? ?No facility-administered medications prior to visit.  ? ? ? ?Review of Systems:  ? ?Constitutional:   No  weight loss, night sweats,  Fevers, chills, ?+ fatigue, or  lassitude. ? ?HEENT:   No headaches,  Difficulty swallowing,  Tooth/dental problems, or  Sore throat,  ?              No sneezing, itching, ear ache, nasal congestion, post nasal drip,  ? ?CV:  No chest pain,  Orthopnea, PND, swelling in lower extremities, anasarca, dizziness, palpitations, syncope.  ? ?GI  No heartburn, indigestion, abdominal pain,  nausea, vomiting, diarrhea, change in bowel habits, loss of appetite, bloody stools.  ? ?Resp: .  No chest wall deformity ? ?Skin: no rash or lesions. ? ?GU: no dysuria, change in color of urine, no urgency or frequency.  No flank pain, no hematuria  ? ?MS:  No joint pain or swelling.  No decreased range of motion.  No back pain. ? ? ? ?Physical Exam ? ?BP (!) 124/58 (BP Location: Left Arm, Patient Position: Sitting, Cuff Size: Normal)   Pulse (!) 101   Temp 97.7 ?F (36.5 ?C) (Oral)   Ht '5\' 8"'$  (1.727 m)   Wt 194 lb 9.6 oz (88.3 kg)   SpO2 93%   BMI 29.59 kg/m?  ? ?GEN: A/Ox3; pleasant , NAD, chronically ill-appearing on oxygen ?  ?HEENT:  /AT,   NOSE-clear, THROAT-clear, no lesions, no postnasal drip or exudate noted.  ? ?NECK:  Supple w/ fair ROM; no JVD; normal carotid impulses w/o bruits; no thyromegaly or nodules palpated; no lymphadenopathy.   ? ?RESP  Clear  P & A; w/o, wheezes/ rales/ or rhonchi. no accessory muscle use, no dullness to percussion ? ?CARD:  RRR, no m/r/g, tr  peripheral edema, pulses intact, no cyanosis or clubbing. ? ?GI:   Soft & nt; nml bowel sounds; no organomegaly or masses detected.  ? ?Musco: Warm bil, no deformities or joint swelling noted.  ? ?Neuro: alert, no focal deficits noted.   ? ?Skin: Warm, no lesions or rashes ? ? ? ?Lab Results: ? ? ? ?BNP ? ? ?ProBNP ?No results found for: PROBNP ? ?Imaging: ?CT Chest High  Resolution ? ?Result Date: 07/30/2021 ?CLINICAL DATA:  Interstitial lung disease, increased shortness of breath EXAM: CT CHEST WITHOUT CONTRAST TECHNIQUE: Multidetector CT imaging of the chest was performed following the

## 2021-08-20 NOTE — Patient Instructions (Addendum)
Finish Prednisone.  ?Continue on Oxygen 3l/m at rest and 6l/m with activity .  ?Order Wheelchair , Civil engineer, contracting , and Rolling walker with bench seat and basket  ?CPAP supplies .  ?Activity as tolerated.  ?Remain off OFEV  ?Labs today  ?Continue on Imuran.  ?May use Albuterol neb Twice daily  As needed  .  ?(Order for nebs and machine)  ?Follow up with Dr. Chase Caller in 4 weeks with chest xray and As needed   ?Please contact office for sooner follow up if symptoms do not improve or worsen or seek emergency care  ? ? ? ? ?

## 2021-08-21 DIAGNOSIS — E782 Mixed hyperlipidemia: Secondary | ICD-10-CM | POA: Diagnosis not present

## 2021-08-21 DIAGNOSIS — G4730 Sleep apnea, unspecified: Secondary | ICD-10-CM | POA: Diagnosis not present

## 2021-08-21 DIAGNOSIS — I10 Essential (primary) hypertension: Secondary | ICD-10-CM | POA: Diagnosis not present

## 2021-08-21 DIAGNOSIS — M0559 Rheumatoid polyneuropathy with rheumatoid arthritis of multiple sites: Secondary | ICD-10-CM | POA: Diagnosis not present

## 2021-08-21 DIAGNOSIS — J849 Interstitial pulmonary disease, unspecified: Secondary | ICD-10-CM | POA: Diagnosis not present

## 2021-08-21 DIAGNOSIS — J8489 Other specified interstitial pulmonary diseases: Secondary | ICD-10-CM

## 2021-08-21 DIAGNOSIS — M359 Systemic involvement of connective tissue, unspecified: Secondary | ICD-10-CM

## 2021-08-21 LAB — CBC WITH DIFFERENTIAL/PLATELET
Basophils Absolute: 0 10*3/uL (ref 0.0–0.1)
Basophils Relative: 0.1 % (ref 0.0–3.0)
Eosinophils Absolute: 0 10*3/uL (ref 0.0–0.7)
Eosinophils Relative: 0.3 % (ref 0.0–5.0)
HCT: 41 % (ref 39.0–52.0)
Hemoglobin: 13.4 g/dL (ref 13.0–17.0)
Lymphocytes Relative: 2.6 % — ABNORMAL LOW (ref 12.0–46.0)
Lymphs Abs: 0.3 10*3/uL — ABNORMAL LOW (ref 0.7–4.0)
MCHC: 32.6 g/dL (ref 30.0–36.0)
MCV: 93.5 fl (ref 78.0–100.0)
Monocytes Absolute: 0.2 10*3/uL (ref 0.1–1.0)
Monocytes Relative: 1.8 % — ABNORMAL LOW (ref 3.0–12.0)
Neutro Abs: 11 10*3/uL — ABNORMAL HIGH (ref 1.4–7.7)
Neutrophils Relative %: 95.4 % — ABNORMAL HIGH (ref 43.0–77.0)
Platelets: 285 10*3/uL (ref 150.0–400.0)
RBC: 4.38 Mil/uL (ref 4.22–5.81)
RDW: 16 % — ABNORMAL HIGH (ref 11.5–15.5)
WBC: 11.5 10*3/uL — ABNORMAL HIGH (ref 4.0–10.5)

## 2021-08-21 LAB — COMPREHENSIVE METABOLIC PANEL
ALT: 16 U/L (ref 0–53)
AST: 21 U/L (ref 0–37)
Albumin: 4 g/dL (ref 3.5–5.2)
Alkaline Phosphatase: 48 U/L (ref 39–117)
BUN: 34 mg/dL — ABNORMAL HIGH (ref 6–23)
CO2: 25 mEq/L (ref 19–32)
Calcium: 9.5 mg/dL (ref 8.4–10.5)
Chloride: 95 mEq/L — ABNORMAL LOW (ref 96–112)
Creatinine, Ser: 1.22 mg/dL (ref 0.40–1.50)
GFR: 55.42 mL/min — ABNORMAL LOW (ref >60.00)
Glucose, Bld: 128 mg/dL — ABNORMAL HIGH (ref 70–99)
Potassium: 4.9 mEq/L (ref 3.5–5.1)
Sodium: 130 mEq/L — ABNORMAL LOW (ref 135–145)
Total Bilirubin: 0.4 mg/dL (ref 0.2–1.2)
Total Protein: 7.6 g/dL (ref 6.0–8.3)

## 2021-08-22 ENCOUNTER — Telehealth: Payer: Self-pay | Admitting: Adult Health

## 2021-08-22 DIAGNOSIS — I10 Essential (primary) hypertension: Secondary | ICD-10-CM | POA: Diagnosis not present

## 2021-08-22 DIAGNOSIS — J849 Interstitial pulmonary disease, unspecified: Secondary | ICD-10-CM | POA: Diagnosis not present

## 2021-08-22 DIAGNOSIS — G4733 Obstructive sleep apnea (adult) (pediatric): Secondary | ICD-10-CM | POA: Diagnosis not present

## 2021-08-22 DIAGNOSIS — N1832 Chronic kidney disease, stage 3b: Secondary | ICD-10-CM | POA: Diagnosis not present

## 2021-08-22 DIAGNOSIS — J9601 Acute respiratory failure with hypoxia: Secondary | ICD-10-CM | POA: Diagnosis not present

## 2021-08-22 NOTE — Telephone Encounter (Signed)
[name] regarding "narrative" needed for wheelchair order.  LVM to return call with exactly what information they need in the order. ?When she calls back, patient needs a standard wheelchair with foot rests, he is 5'8" and weighs 194.9 lbs.  He has ILD and  is oxygen dependent.  He gets sob with minimal exertion. ?

## 2021-08-23 DIAGNOSIS — N1832 Chronic kidney disease, stage 3b: Secondary | ICD-10-CM | POA: Diagnosis not present

## 2021-08-23 DIAGNOSIS — R5381 Other malaise: Secondary | ICD-10-CM | POA: Insufficient documentation

## 2021-08-23 DIAGNOSIS — G4733 Obstructive sleep apnea (adult) (pediatric): Secondary | ICD-10-CM | POA: Diagnosis not present

## 2021-08-23 DIAGNOSIS — J9611 Chronic respiratory failure with hypoxia: Secondary | ICD-10-CM | POA: Insufficient documentation

## 2021-08-23 DIAGNOSIS — J9601 Acute respiratory failure with hypoxia: Secondary | ICD-10-CM | POA: Diagnosis not present

## 2021-08-23 DIAGNOSIS — I10 Essential (primary) hypertension: Secondary | ICD-10-CM | POA: Diagnosis not present

## 2021-08-23 NOTE — Assessment & Plan Note (Signed)
Continue on current oxygen settings.  To maintain O2 saturations greater than 88 to 90%. ?

## 2021-08-23 NOTE — Assessment & Plan Note (Signed)
Recent exacerbation now improving back to baseline.  Patient was unable to tolerate Ofev.  We discussed a drug holiday and lower dosing however patient declines.  Discussed alternative antifibrotic with Esbriet patient declines at this time. ?We will have him finish a prednisone burst. ?Plan  ?Patient Instructions  ?Finish Prednisone.  ?Continue on Oxygen 3l/m at rest and 6l/m with activity .  ?Order Wheelchair , Civil engineer, contracting , and Rolling walker with bench seat and basket  ?CPAP supplies .  ?Activity as tolerated.  ?Remain off OFEV  ?Labs today  ?Continue on Imuran.  ?May use Albuterol neb Twice daily  As needed  .  ?(Order for nebs and machine)  ?Follow up with Dr. Chase Caller in 4 weeks with chest xray and As needed   ?Please contact office for sooner follow up if symptoms do not improve or worsen or seek emergency care  ? ? ? ? ?  ? ?

## 2021-08-23 NOTE — Assessment & Plan Note (Signed)
Continue on current regimen and follow-up with rheumatology ?

## 2021-08-23 NOTE — Assessment & Plan Note (Signed)
Continue on CPAP.  Order for supplies at [facility] ? ?Plan  ?Marland Kitchen ?Patient Instructions  ?Finish Prednisone.  ?Continue on Oxygen 3l/m at rest and 6l/m with activity .  ?Order Wheelchair , Civil engineer, contracting , and Rolling walker with bench seat and basket  ?CPAP supplies .  ?Activity as tolerated.  ?Remain off OFEV  ?Labs today  ?Continue on Imuran.  ?May use Albuterol neb Twice daily  As needed  .  ?(Order for nebs and machine)  ?Follow up with Dr. Chase Caller in 4 weeks with chest xray and As needed   ?Please contact office for sooner follow up if symptoms do not improve or worsen or seek emergency care  ? ? ? ? ?  ? ?

## 2021-08-23 NOTE — Assessment & Plan Note (Addendum)
Patient has significant physical deconditioning due to multiple comorbidities with oxygen dependent respiratory failure.  Interstitial lung disease.  Patient needs some assistance at home.  He needs a shower chair to help facilitate his ADLs.  Also needs a wheelchair to be able to get to office appointments and outside of the home.  And needs a rolling walker with bench seat and basket in his home to be able to be more mobile and independent.  He has unstable gait and balance. ?Addendum :  patient needs a standard wheelchair with foot rests, he is 5'8" and weighs 194.9 lbs.  He has ILD and  is oxygen dependent.  He gets sob with minimal exertion. ?Plan  ?Patient Instructions  ?Finish Prednisone.  ?Continue on Oxygen 3l/m at rest and 6l/m with activity .  ?Order Wheelchair , Civil engineer, contracting , and Rolling walker with bench seat and basket  ?CPAP supplies .  ?Activity as tolerated.  ?Remain off OFEV  ?Labs today  ?Continue on Imuran.  ?May use Albuterol neb Twice daily  As needed  .  ?(Order for nebs and machine)  ?Follow up with Dr. Chase Caller in 4 weeks with chest xray and As needed   ?Please contact office for sooner follow up if symptoms do not improve or worsen or seek emergency care  ? ? ? ? ?  ? ?

## 2021-08-24 NOTE — Progress Notes (Signed)
Called and spoke with patient, provided results/recommendations per Tammy Parrett NP.  He verbalized understanding.  Nothing further needed.

## 2021-08-29 NOTE — Telephone Encounter (Signed)
Called and spoke with Andee Poles who states that she is needing office notes saying that patient needs a wheelchair and why he needs it. Looks like order was sent to them on 08/20/2021 but she needs the office notes with that information first. ?

## 2021-08-31 DIAGNOSIS — R0603 Acute respiratory distress: Secondary | ICD-10-CM | POA: Diagnosis not present

## 2021-08-31 DIAGNOSIS — R000 Tachycardia, unspecified: Secondary | ICD-10-CM | POA: Diagnosis not present

## 2021-08-31 DIAGNOSIS — R0902 Hypoxemia: Secondary | ICD-10-CM | POA: Diagnosis not present

## 2021-08-31 DIAGNOSIS — J80 Acute respiratory distress syndrome: Secondary | ICD-10-CM | POA: Diagnosis not present

## 2021-08-31 DIAGNOSIS — R0689 Other abnormalities of breathing: Secondary | ICD-10-CM | POA: Diagnosis not present

## 2021-08-31 DIAGNOSIS — J441 Chronic obstructive pulmonary disease with (acute) exacerbation: Secondary | ICD-10-CM | POA: Diagnosis not present

## 2021-08-31 NOTE — Telephone Encounter (Signed)
My office note was very specific on the reason why he needed a wheelchair ? ?I went ahead and addended note with the specific narrative that DME requested hopefully that will be enough to get the needed equipment this patient so desperately needs ?

## 2021-08-31 NOTE — Telephone Encounter (Signed)
Called and spoke with Danielle from [employer]. She stated that she needed office notes stating why the patient needed the wheelchair. Printed off office note and faxed it over. Nothing further needed at this time.  ?

## 2021-09-01 ENCOUNTER — Inpatient Hospital Stay (HOSPITAL_COMMUNITY): Payer: Medicare Other

## 2021-09-01 ENCOUNTER — Other Ambulatory Visit: Payer: Self-pay

## 2021-09-01 ENCOUNTER — Inpatient Hospital Stay (HOSPITAL_COMMUNITY)
Admission: EM | Admit: 2021-09-01 | Discharge: 2021-09-27 | DRG: 208 | Disposition: E | Payer: Medicare Other | Attending: Internal Medicine | Admitting: Internal Medicine

## 2021-09-01 ENCOUNTER — Encounter (HOSPITAL_COMMUNITY): Payer: Self-pay

## 2021-09-01 ENCOUNTER — Emergency Department (HOSPITAL_COMMUNITY): Payer: Medicare Other

## 2021-09-01 DIAGNOSIS — R0603 Acute respiratory distress: Principal | ICD-10-CM

## 2021-09-01 DIAGNOSIS — Z7952 Long term (current) use of systemic steroids: Secondary | ICD-10-CM

## 2021-09-01 DIAGNOSIS — I501 Left ventricular failure: Secondary | ICD-10-CM | POA: Diagnosis not present

## 2021-09-01 DIAGNOSIS — J84112 Idiopathic pulmonary fibrosis: Secondary | ICD-10-CM | POA: Diagnosis not present

## 2021-09-01 DIAGNOSIS — R34 Anuria and oliguria: Secondary | ICD-10-CM | POA: Diagnosis present

## 2021-09-01 DIAGNOSIS — I5032 Chronic diastolic (congestive) heart failure: Secondary | ICD-10-CM | POA: Diagnosis present

## 2021-09-01 DIAGNOSIS — D649 Anemia, unspecified: Secondary | ICD-10-CM | POA: Diagnosis present

## 2021-09-01 DIAGNOSIS — J441 Chronic obstructive pulmonary disease with (acute) exacerbation: Secondary | ICD-10-CM | POA: Diagnosis not present

## 2021-09-01 DIAGNOSIS — J9621 Acute and chronic respiratory failure with hypoxia: Secondary | ICD-10-CM

## 2021-09-01 DIAGNOSIS — M0510 Rheumatoid lung disease with rheumatoid arthritis of unspecified site: Secondary | ICD-10-CM | POA: Diagnosis not present

## 2021-09-01 DIAGNOSIS — J439 Emphysema, unspecified: Secondary | ICD-10-CM | POA: Diagnosis not present

## 2021-09-01 DIAGNOSIS — R079 Chest pain, unspecified: Secondary | ICD-10-CM | POA: Diagnosis not present

## 2021-09-01 DIAGNOSIS — R739 Hyperglycemia, unspecified: Secondary | ICD-10-CM | POA: Diagnosis not present

## 2021-09-01 DIAGNOSIS — Z66 Do not resuscitate: Secondary | ICD-10-CM | POA: Diagnosis not present

## 2021-09-01 DIAGNOSIS — Z7982 Long term (current) use of aspirin: Secondary | ICD-10-CM | POA: Diagnosis not present

## 2021-09-01 DIAGNOSIS — R0602 Shortness of breath: Secondary | ICD-10-CM | POA: Diagnosis not present

## 2021-09-01 DIAGNOSIS — J90 Pleural effusion, not elsewhere classified: Secondary | ICD-10-CM | POA: Diagnosis not present

## 2021-09-01 DIAGNOSIS — Z9981 Dependence on supplemental oxygen: Secondary | ICD-10-CM

## 2021-09-01 DIAGNOSIS — E871 Hypo-osmolality and hyponatremia: Secondary | ICD-10-CM | POA: Diagnosis not present

## 2021-09-01 DIAGNOSIS — G4733 Obstructive sleep apnea (adult) (pediatric): Secondary | ICD-10-CM | POA: Diagnosis present

## 2021-09-01 DIAGNOSIS — I10 Essential (primary) hypertension: Secondary | ICD-10-CM | POA: Diagnosis present

## 2021-09-01 DIAGNOSIS — I959 Hypotension, unspecified: Secondary | ICD-10-CM | POA: Diagnosis not present

## 2021-09-01 DIAGNOSIS — E872 Acidosis, unspecified: Secondary | ICD-10-CM | POA: Diagnosis not present

## 2021-09-01 DIAGNOSIS — N179 Acute kidney failure, unspecified: Secondary | ICD-10-CM | POA: Diagnosis not present

## 2021-09-01 DIAGNOSIS — Z86010 Personal history of colonic polyps: Secondary | ICD-10-CM | POA: Diagnosis not present

## 2021-09-01 DIAGNOSIS — Z20822 Contact with and (suspected) exposure to covid-19: Secondary | ICD-10-CM | POA: Diagnosis present

## 2021-09-01 DIAGNOSIS — E875 Hyperkalemia: Secondary | ICD-10-CM | POA: Diagnosis not present

## 2021-09-01 DIAGNOSIS — Z4682 Encounter for fitting and adjustment of non-vascular catheter: Secondary | ICD-10-CM | POA: Diagnosis not present

## 2021-09-01 DIAGNOSIS — Z515 Encounter for palliative care: Secondary | ICD-10-CM | POA: Diagnosis not present

## 2021-09-01 DIAGNOSIS — K449 Diaphragmatic hernia without obstruction or gangrene: Secondary | ICD-10-CM | POA: Diagnosis not present

## 2021-09-01 DIAGNOSIS — Z8042 Family history of malignant neoplasm of prostate: Secondary | ICD-10-CM

## 2021-09-01 DIAGNOSIS — N289 Disorder of kidney and ureter, unspecified: Secondary | ICD-10-CM

## 2021-09-01 DIAGNOSIS — K219 Gastro-esophageal reflux disease without esophagitis: Secondary | ICD-10-CM | POA: Diagnosis not present

## 2021-09-01 DIAGNOSIS — I130 Hypertensive heart and chronic kidney disease with heart failure and stage 1 through stage 4 chronic kidney disease, or unspecified chronic kidney disease: Secondary | ICD-10-CM | POA: Diagnosis not present

## 2021-09-01 DIAGNOSIS — M069 Rheumatoid arthritis, unspecified: Secondary | ICD-10-CM | POA: Diagnosis present

## 2021-09-01 DIAGNOSIS — J849 Interstitial pulmonary disease, unspecified: Secondary | ICD-10-CM | POA: Diagnosis not present

## 2021-09-01 DIAGNOSIS — Z79899 Other long term (current) drug therapy: Secondary | ICD-10-CM

## 2021-09-01 DIAGNOSIS — N1832 Chronic kidney disease, stage 3b: Secondary | ICD-10-CM | POA: Diagnosis not present

## 2021-09-01 DIAGNOSIS — J9690 Respiratory failure, unspecified, unspecified whether with hypoxia or hypercapnia: Secondary | ICD-10-CM | POA: Diagnosis not present

## 2021-09-01 DIAGNOSIS — Z8709 Personal history of other diseases of the respiratory system: Secondary | ICD-10-CM | POA: Diagnosis not present

## 2021-09-01 HISTORY — DX: Chronic respiratory failure with hypoxia: J96.11

## 2021-09-01 LAB — CBC WITH DIFFERENTIAL/PLATELET
Abs Immature Granulocytes: 0.06 10*3/uL (ref 0.00–0.07)
Abs Immature Granulocytes: 0.1 10*3/uL — ABNORMAL HIGH (ref 0.00–0.07)
Basophils Absolute: 0 10*3/uL (ref 0.0–0.1)
Basophils Absolute: 0 10*3/uL (ref 0.0–0.1)
Basophils Relative: 0 %
Basophils Relative: 0 %
Eosinophils Absolute: 0.1 10*3/uL (ref 0.0–0.5)
Eosinophils Absolute: 0 10*3/uL (ref 0.0–0.5)
Eosinophils Relative: 0 %
Eosinophils Relative: 0 %
HCT: 37.8 % — ABNORMAL LOW (ref 39.0–52.0)
HCT: 34.5 % — ABNORMAL LOW (ref 39.0–52.0)
Hemoglobin: 12 g/dL — ABNORMAL LOW (ref 13.0–17.0)
Hemoglobin: 11.1 g/dL — ABNORMAL LOW (ref 13.0–17.0)
Immature Granulocytes: 0 %
Immature Granulocytes: 1 %
Lymphocytes Relative: 3 %
Lymphocytes Relative: 1 %
Lymphs Abs: 0.4 10*3/uL — ABNORMAL LOW (ref 0.7–4.0)
Lymphs Abs: 0.1 10*3/uL — ABNORMAL LOW (ref 0.7–4.0)
MCH: 30.2 pg (ref 26.0–34.0)
MCH: 30.3 pg (ref 26.0–34.0)
MCHC: 31.7 g/dL (ref 30.0–36.0)
MCHC: 32.2 g/dL (ref 30.0–36.0)
MCV: 95.2 fL (ref 80.0–100.0)
MCV: 94.3 fL (ref 80.0–100.0)
Monocytes Absolute: 0.7 10*3/uL (ref 0.1–1.0)
Monocytes Absolute: 0.3 10*3/uL (ref 0.1–1.0)
Monocytes Relative: 5 %
Monocytes Relative: 2 %
Neutro Abs: 12.8 10*3/uL — ABNORMAL HIGH (ref 1.7–7.7)
Neutro Abs: 13.5 10*3/uL — ABNORMAL HIGH (ref 1.7–7.7)
Neutrophils Relative %: 92 %
Neutrophils Relative %: 96 %
Platelets: 165 10*3/uL (ref 150–400)
Platelets: 158 10*3/uL (ref 150–400)
RBC: 3.97 MIL/uL — ABNORMAL LOW (ref 4.22–5.81)
RBC: 3.66 MIL/uL — ABNORMAL LOW (ref 4.22–5.81)
RDW: 15.8 % — ABNORMAL HIGH (ref 11.5–15.5)
RDW: 15.7 % — ABNORMAL HIGH (ref 11.5–15.5)
WBC: 14 10*3/uL — ABNORMAL HIGH (ref 4.0–10.5)
WBC: 14.1 10*3/uL — ABNORMAL HIGH (ref 4.0–10.5)
nRBC: 0 % (ref 0.0–0.2)
nRBC: 0 % (ref 0.0–0.2)

## 2021-09-01 LAB — BLOOD GAS, ARTERIAL
Acid-base deficit: 7.2 mmol/L — ABNORMAL HIGH (ref 0.0–2.0)
Bicarbonate: 19.7 mmol/L — ABNORMAL LOW (ref 20.0–28.0)
Drawn by: 640631
O2 Saturation: 96.5 %
Patient temperature: 36.6
pCO2 arterial: 43 mmHg (ref 32–48)
pH, Arterial: 7.27 — ABNORMAL LOW (ref 7.35–7.45)
pO2, Arterial: 83 mmHg (ref 83–108)

## 2021-09-01 LAB — BASIC METABOLIC PANEL
Anion gap: 10 (ref 5–15)
BUN: 28 mg/dL — ABNORMAL HIGH (ref 8–23)
CO2: 22 mmol/L (ref 22–32)
Calcium: 8.6 mg/dL — ABNORMAL LOW (ref 8.9–10.3)
Chloride: 99 mmol/L (ref 98–111)
Creatinine, Ser: 1.46 mg/dL — ABNORMAL HIGH (ref 0.61–1.24)
GFR, Estimated: 48 mL/min — ABNORMAL LOW (ref >60)
Glucose, Bld: 185 mg/dL — ABNORMAL HIGH (ref 70–99)
Potassium: 4.4 mmol/L (ref 3.5–5.1)
Sodium: 131 mmol/L — ABNORMAL LOW (ref 135–145)

## 2021-09-01 LAB — RESPIRATORY PANEL BY PCR

## 2021-09-01 LAB — URINALYSIS, ROUTINE W REFLEX MICROSCOPIC
Bilirubin Urine: NEGATIVE
Glucose, UA: 150 mg/dL — AB
Hgb urine dipstick: NEGATIVE
Ketones, ur: NEGATIVE mg/dL
Leukocytes,Ua: NEGATIVE
Nitrite: NEGATIVE
Protein, ur: NEGATIVE mg/dL
Specific Gravity, Urine: 1.02 (ref 1.005–1.030)
pH: 5 (ref 5.0–8.0)

## 2021-09-01 LAB — PHOSPHORUS: Phosphorus: 2.6 mg/dL (ref 2.5–4.6)

## 2021-09-01 LAB — COMPREHENSIVE METABOLIC PANEL
ALT: 17 U/L (ref 0–44)
AST: 29 U/L (ref 15–41)
Albumin: 2.9 g/dL — ABNORMAL LOW (ref 3.5–5.0)
Alkaline Phosphatase: 46 U/L (ref 38–126)
Anion gap: 11 (ref 5–15)
BUN: 27 mg/dL — ABNORMAL HIGH (ref 8–23)
CO2: 21 mmol/L — ABNORMAL LOW (ref 22–32)
Calcium: 8.5 mg/dL — ABNORMAL LOW (ref 8.9–10.3)
Chloride: 99 mmol/L (ref 98–111)
Creatinine, Ser: 1.47 mg/dL — ABNORMAL HIGH (ref 0.61–1.24)
GFR, Estimated: 48 mL/min — ABNORMAL LOW (ref >60)
Glucose, Bld: 229 mg/dL — ABNORMAL HIGH (ref 70–99)
Potassium: 4.5 mmol/L (ref 3.5–5.1)
Sodium: 131 mmol/L — ABNORMAL LOW (ref 135–145)
Total Bilirubin: 0.9 mg/dL (ref 0.3–1.2)
Total Protein: 6.9 g/dL (ref 6.5–8.1)

## 2021-09-01 LAB — BRAIN NATRIURETIC PEPTIDE: B Natriuretic Peptide: 61.8 pg/mL (ref 0.0–100.0)

## 2021-09-01 LAB — PROCALCITONIN: Procalcitonin: 0.25 ng/mL

## 2021-09-01 LAB — D-DIMER, QUANTITATIVE: D-Dimer, Quant: >20 ug/mL-FEU — ABNORMAL HIGH (ref 0.00–0.50)

## 2021-09-01 LAB — TROPONIN I (HIGH SENSITIVITY)
Troponin I (High Sensitivity): 13 ng/L (ref <18)
Troponin I (High Sensitivity): 16 ng/L (ref <18)

## 2021-09-01 LAB — LACTIC ACID, PLASMA
Lactic Acid, Venous: 4.5 mmol/L (ref 0.5–1.9)
Lactic Acid, Venous: 3.3 mmol/L (ref 0.5–1.9)

## 2021-09-01 LAB — HIV ANTIBODY (ROUTINE TESTING W REFLEX): HIV Screen 4th Generation wRfx: NONREACTIVE

## 2021-09-01 LAB — MAGNESIUM: Magnesium: 2 mg/dL (ref 1.7–2.4)

## 2021-09-01 LAB — RESP PANEL BY RT-PCR (FLU A&B, COVID) ARPGX2
Influenza A by PCR: NEGATIVE
Influenza B by PCR: NEGATIVE
SARS Coronavirus 2 by RT PCR: NEGATIVE

## 2021-09-01 LAB — MRSA NEXT GEN BY PCR, NASAL: MRSA by PCR Next Gen: NOT DETECTED

## 2021-09-01 LAB — ECHOCARDIOGRAM LIMITED
Height: 68 in
Weight: 3104 oz

## 2021-09-01 LAB — STREP PNEUMONIAE URINARY ANTIGEN: Strep Pneumo Urinary Antigen: NEGATIVE

## 2021-09-01 MED ORDER — PERFLUTREN LIPID MICROSPHERE
1.0000 mL | INTRAVENOUS | Status: AC | PRN
  Administered 2021-09-01: 2 mL via INTRAVENOUS
  Filled 2021-09-01: qty 10

## 2021-09-01 MED ORDER — ATORVASTATIN CALCIUM 10 MG PO TABS
10.0000 mg | ORAL_TABLET | Freq: Every day | ORAL | Status: DC
  Administered 2021-09-01: 10 mg via ORAL
  Filled 2021-09-01: qty 1

## 2021-09-01 MED ORDER — AZATHIOPRINE 50 MG PO TABS
50.0000 mg | ORAL_TABLET | Freq: Every day | ORAL | Status: DC
  Filled 2021-09-01 (×2): qty 1

## 2021-09-01 MED ORDER — ASPIRIN EC 81 MG PO TBEC: 81.0000 mg | DELAYED_RELEASE_TABLET | Freq: Every day | ORAL | Status: DC

## 2021-09-01 MED ORDER — AMLODIPINE BESYLATE 10 MG PO TABS
10.0000 mg | ORAL_TABLET | Freq: Every day | ORAL | Status: DC
  Administered 2021-09-01: 10 mg via ORAL
  Filled 2021-09-01: qty 1

## 2021-09-01 MED ORDER — NINTEDANIB ESYLATE 150 MG PO CAPS
150.0000 mg | ORAL_CAPSULE | Freq: Two times a day (BID) | ORAL | Status: DC
Start: 2021-09-01 — End: 2021-09-02

## 2021-09-01 MED ORDER — SODIUM CHLORIDE 0.9 % IV SOLN
500.0000 mg | INTRAVENOUS | Status: DC
  Administered 2021-09-01 – 2021-09-04 (×4): 500 mg via INTRAVENOUS
  Filled 2021-09-01 (×5): qty 5

## 2021-09-01 MED ORDER — HEPARIN SODIUM (PORCINE) 5000 UNIT/ML IJ SOLN
5000.0000 [IU] | Freq: Three times a day (TID) | INTRAMUSCULAR | Status: DC
  Administered 2021-09-01 – 2021-09-02 (×3): 5000 [IU] via SUBCUTANEOUS
  Filled 2021-09-01 (×3): qty 1

## 2021-09-01 MED ORDER — VANCOMYCIN HCL 2000 MG/400ML IV SOLN
2000.0000 mg | INTRAVENOUS | Status: DC
Start: 2021-09-01 — End: 2021-09-02
  Administered 2021-09-01: 2000 mg via INTRAVENOUS
  Filled 2021-09-01: qty 400

## 2021-09-01 MED ORDER — FLUTICASONE PROPIONATE 50 MCG/ACT NA SUSP
2.0000 | Freq: Every day | NASAL | Status: DC
  Administered 2021-09-01: 2 via NASAL
  Filled 2021-09-01: qty 16

## 2021-09-01 MED ORDER — IPRATROPIUM BROMIDE 0.02 % IN SOLN
0.5000 mg | Freq: Once | RESPIRATORY_TRACT | Status: AC
Start: 2021-09-01 — End: 2021-09-01
  Administered 2021-09-01: 0.5 mg via RESPIRATORY_TRACT
  Filled 2021-09-01: qty 2.5

## 2021-09-01 MED ORDER — HEPARIN (PORCINE) 25000 UT/250ML-% IV SOLN
1100.0000 [IU]/h | INTRAVENOUS | Status: DC
  Administered 2021-09-01: 1100 [IU]/h via INTRAVENOUS
  Filled 2021-09-01: qty 250

## 2021-09-01 MED ORDER — ALBUTEROL SULFATE (2.5 MG/3ML) 0.083% IN NEBU
2.5000 mg | INHALATION_SOLUTION | RESPIRATORY_TRACT | Status: DC | PRN
Start: 2021-09-01 — End: 2021-09-01
  Filled 2021-09-01: qty 3

## 2021-09-01 MED ORDER — ARFORMOTEROL TARTRATE 15 MCG/2ML IN NEBU
15.0000 ug | INHALATION_SOLUTION | Freq: Two times a day (BID) | RESPIRATORY_TRACT | Status: DC
  Administered 2021-09-01 – 2021-09-03 (×6): 15 ug via RESPIRATORY_TRACT
  Filled 2021-09-01 (×6): qty 2

## 2021-09-01 MED ORDER — METHYLPREDNISOLONE SODIUM SUCC 125 MG IJ SOLR: 125.0000 mg | Freq: Once | INTRAMUSCULAR | Status: DC

## 2021-09-01 MED ORDER — ALBUTEROL SULFATE (2.5 MG/3ML) 0.083% IN NEBU: 2.5000 mg | INHALATION_SOLUTION | RESPIRATORY_TRACT | Status: DC | PRN

## 2021-09-01 MED ORDER — OLOPATADINE HCL 0.1 % OP SOLN
1.0000 [drp] | Freq: Every day | OPHTHALMIC | Status: DC
Start: 2021-09-01 — End: 2021-09-02
  Administered 2021-09-01: 1 [drp] via OPHTHALMIC
  Filled 2021-09-01: qty 5

## 2021-09-01 MED ORDER — ACETAMINOPHEN 650 MG RE SUPP: 650.0000 mg | Freq: Four times a day (QID) | RECTAL | Status: DC | PRN

## 2021-09-01 MED ORDER — ALBUTEROL SULFATE (2.5 MG/3ML) 0.083% IN NEBU
5.0000 mg | INHALATION_SOLUTION | Freq: Once | RESPIRATORY_TRACT | Status: AC
  Administered 2021-09-01: 5 mg via RESPIRATORY_TRACT
  Filled 2021-09-01: qty 6

## 2021-09-01 MED ORDER — GUAIFENESIN ER 600 MG PO TB12
600.0000 mg | ORAL_TABLET | Freq: Two times a day (BID) | ORAL | Status: DC
  Administered 2021-09-01 (×2): 600 mg via ORAL
  Filled 2021-09-01 (×2): qty 1

## 2021-09-01 MED ORDER — BUDESONIDE 0.25 MG/2ML IN SUSP
0.2500 mg | Freq: Two times a day (BID) | RESPIRATORY_TRACT | Status: DC
  Administered 2021-09-01 – 2021-09-03 (×6): 0.25 mg via RESPIRATORY_TRACT
  Filled 2021-09-01 (×6): qty 2

## 2021-09-01 MED ORDER — SODIUM CHLORIDE 0.9 % IV SOLN
2.0000 g | INTRAVENOUS | Status: DC
  Administered 2021-09-01: 2 g via INTRAVENOUS
  Filled 2021-09-01 (×3): qty 20

## 2021-09-01 MED ORDER — PANTOPRAZOLE SODIUM 40 MG PO TBEC
40.0000 mg | DELAYED_RELEASE_TABLET | Freq: Every day | ORAL | Status: DC
Start: 2021-09-01 — End: 2021-09-02
  Administered 2021-09-01: 40 mg via ORAL
  Filled 2021-09-01: qty 1

## 2021-09-01 MED ORDER — GUAIFENESIN-DM 100-10 MG/5ML PO SYRP
5.0000 mL | ORAL_SOLUTION | ORAL | Status: DC | PRN
  Administered 2021-09-01: 5 mL via ORAL
  Filled 2021-09-01: qty 5

## 2021-09-01 MED ORDER — IPRATROPIUM-ALBUTEROL 0.5-2.5 (3) MG/3ML IN SOLN
3.0000 mL | Freq: Four times a day (QID) | RESPIRATORY_TRACT | Status: DC
  Administered 2021-09-01 – 2021-09-04 (×12): 3 mL via RESPIRATORY_TRACT
  Filled 2021-09-01 (×12): qty 3

## 2021-09-01 MED ORDER — ACETAMINOPHEN 325 MG PO TABS: 650.0000 mg | ORAL_TABLET | Freq: Four times a day (QID) | ORAL | Status: DC | PRN

## 2021-09-01 MED ORDER — HEPARIN BOLUS VIA INFUSION
4000.0000 [IU] | Freq: Once | INTRAVENOUS | Status: AC
  Administered 2021-09-01: 4000 [IU] via INTRAVENOUS
  Filled 2021-09-01: qty 4000

## 2021-09-01 MED ORDER — METHYLPREDNISOLONE SODIUM SUCC 125 MG IJ SOLR
80.0000 mg | Freq: Two times a day (BID) | INTRAMUSCULAR | Status: DC
Start: 2021-09-01 — End: 2021-09-04
  Administered 2021-09-01 – 2021-09-03 (×6): 80 mg via INTRAVENOUS
  Filled 2021-09-01 (×6): qty 2

## 2021-09-01 MED ORDER — MENTHOL 3 MG MT LOZG
1.0000 | LOZENGE | OROMUCOSAL | Status: DC | PRN
  Administered 2021-09-01: 3 mg via ORAL
  Filled 2021-09-01 (×2): qty 9

## 2021-09-01 MED ORDER — MONTELUKAST SODIUM 10 MG PO TABS
10.0000 mg | ORAL_TABLET | Freq: Every day | ORAL | Status: DC
  Administered 2021-09-01: 10 mg via ORAL
  Filled 2021-09-01: qty 1

## 2021-09-01 MED ORDER — FUROSEMIDE 10 MG/ML IJ SOLN: 40.0000 mg | Freq: Three times a day (TID) | INTRAMUSCULAR | Status: DC

## 2021-09-01 MED ORDER — IOHEXOL 350 MG/ML SOLN
75.0000 mL | Freq: Once | INTRAVENOUS | Status: AC | PRN
  Administered 2021-09-01: 75 mL via INTRAVENOUS

## 2021-09-01 NOTE — Progress Notes (Signed)
This RT offered patient to have a trial off BIPAP. Patient states that he is comfortable on BIPAP & would like to stay on it for now.  ?

## 2021-09-01 NOTE — Progress Notes (Signed)
HOSPITAL MEDICINE OVERNIGHT EVENT NOTE   ? ?Notified by nursing that patient arrived on the medical floor and while being transported between hospital beds the patient began to experience respiratory distress. ? ?Nursing reports respiratory rate rising to the 30s to 40s with heart rate rising above 130.  This is despite already being on BiPAP initiated in the emergency department. ? ?Chart reviewed as well as discussed with the admitting provider Dr. Velia Meyer -patient is being hospitalized for exacerbation of his advanced pulmonary fibrosis.   ? ?These patients in particular have very little reserve and the simple active being transferred from bed to bed likely has caused a significant amount of exertional dyspnea.  I have asked for a stat ABG and anticipate that patient's respiratory status should slowly improve as the patient rests in bed.  Recommend continuation of established plan of care including bronchodilator therapy and systemic steroids with noninvasive positive pressure ventilation. ? ?Vernelle Emerald  MD ?[facility]  ? ? ? ? ? ? ? ? ? ? ?

## 2021-09-01 NOTE — Assessment & Plan Note (Addendum)
Azathioprine when able to take PO ?  ?  ?

## 2021-09-01 NOTE — Progress Notes (Signed)
Pharmacy Antibiotic Note ? ?[name] is a 81 y.o. male admitted on 09/21/2021 with pneumonia.  Pharmacy has been consulted for vancomycin dosing. ? ?Patient currently afebrile, wbc slightly elevated at 14.1. Scr appears to be at baseline around 1.4.  ? ?Vancomycin 2000 mg IV Q 48 hrs. Goal AUC 400-550. ?Expected AUC: 437 ?SCr used: 1.47 ? ? ?Plan: ?Vancomycin 2000 IV every 48 hours.  Goal trough 15-20 mcg/mL. ? ?Height: '5\' 8"'$  (172.7 cm) ?Weight: 88 kg (194 lb) ?IBW/kg (Calculated) : 68.4 ? ?Temp (24hrs), Avg:98.1 ?F (36.7 ?C), Min:97.5 ?F (36.4 ?C), Max:98.9 ?F (37.2 ?C) ? ?Recent Labs  ?Lab 08/31/2021 ?0109 09/02/2021 ?5732  ?WBC 14.0* 14.1*  ?CREATININE 1.46* 1.47*  ?  ?Estimated Creatinine Clearance: 42.5 mL/min (A) (by C-G formula based on SCr of 1.47 mg/dL (H)).   ? ?No Known Allergies ? ?Thank you for allowing pharmacy to be a part of this patient?s care. ? ?Erin Hearing PharmD., BCPS ?Clinical Pharmacist ?09/20/2021 8:38 AM ? ?

## 2021-09-01 NOTE — Assessment & Plan Note (Addendum)
Likely ILD exacerbation ?Recent admission, treated with steroids, abx, diuretics for ILD exacerbation +/- superimposed bacterial pneumonia and diastolic HF ?On 3 l chronically at rest, uses 6 L when ambulating per last progress note ?satting 88% on 2 L at arrival, was placed on bipap ?CXR with diffuse interstitial prominence and fibrosis ?He's currently bipap dependent due to work of breathing ?ABG shows metabolic acidosis ?D dimer is >20, follow LE [place], limited echo.  Not stable for CT PE protocol at this time. ?Doesn't appear overloaded, normal BNP ?Negative covid, influenza, RVP ?Follow MRSA PCR ?Urine strep, urine legionella ?Blood cultures ?Continue bipap for wob, scheduled and prn nebs ?Ofev, singulair if able to take po ?Start antibiotics ?Continue steroids ?Heparin for elevated d dimer, concern for possible PE.  ?PCCM consult  ?

## 2021-09-01 NOTE — Consult Note (Signed)
? ?NAME:  [name], MRN:  [id number], DOB:  1940-05-15, LOS: 0 ?ADMISSION DATE:  08/31/2021, CONSULTATION DATE:  09/03/2021 ?REFERRING MD:  Florene Glen, CHIEF COMPLAINT:  SOB  ? ?History of Present Illness:  ?Very pleasant 81 year old man well known to our service with chronic hypoxemia due to RA associated ILD.  He was recently admitted from 4/14-4/18 with ILD exacerbation requiring prolonged steroid taper.  It seems once he finished this taper his DOE again got worse and now needed 6L at all times at home.   In ER required BIPAP through night and into this AM.  PCCM consulted to assist with further management. ? ?Pertinent  Medical History  ?CKD ?CTD-ILD with significant fibrosis ?GERD ?OSA on CPAP ?Prior esophageal strictures ?Prior R sided PTX ? ?Significant Hospital Events: ?Including procedures, antibiotic start and stop dates in addition to other pertinent events   ?5/5 admitted ?5/6 PCCM consult ? ?Interim History / Subjective:  ?Consulted, ROS as below ? ?Objective   ?Blood pressure (!) 150/85, pulse (!) 102, temperature 98.9 ?F (37.2 ?C), temperature source Axillary, resp. rate 18, height '5\' 8"'$  (1.727 m), weight 88 kg, SpO2 94 %. ?   ?Vent Mode: BIPAP;PCV ?FiO2 (%):  [40 %-50 %] 50 % ?Set Rate:  [14 bmp] 14 bmp ?PEEP:  [5 cmH20] 5 cmH20 ?Pressure Support:  [5 cmH20] 5 cmH20  ?No intake or output data in the 24 hours ending 09/08/2021 1257 ?Filed Weights  ? 08/28/2021 0800  ?Weight: 88 kg  ? ? ?Examination: ?General: very pleasant man tachypneic but conversant on BIPAP ?HENT: BIPAP mask with good seal ?Lungs: Dry crackles bases, occasional accessory muscle use ?Cardiovascular: tachycardic, ext warm ?Abdomen: soft, +BS ?Extremities: maybe trace edema? ?Neuro: Moves all 4 ext to command ?Psych: Aox3, pleasant, RASS 0 ? ?Resolved Hospital Problem list   ?N/A ? ?Assessment & Plan:  ?Acute on chronic hypoxemic respiratory failure- not much change in CXR with chronic fibrotic changes.  BNP flat.  D dimer way up but takes  serracor (off label supplement used for IPF adjuvant therapy) which is known to degrade fibrin and may produce falsely elevated results.  No s/s of infection.  Overall most c/w IPF. ?- Agree with CTA given high D dimer if patient can lay flat ?- Continue steroids, imuran, nebs as ordered ?- BIPAP qHS and PRN ?- If Cr okay tomorrow and stubborn O2 needs may do a diuretic challenge ?- Will follow ? ?Best Practice (right click and "Reselect all SmartList Selections" daily)  ?Per primary ? ?Labs   ?CBC: ?Recent Labs  ?Lab 09/14/2021 ?0109 08/31/2021 ?1962  ?WBC 14.0* 14.1*  ?NEUTROABS 12.8* 13.5*  ?HGB 12.0* 11.1*  ?HCT 37.8* 34.5*  ?MCV 95.2 94.3  ?PLT 165 158  ? ? ?Basic Metabolic Panel: ?Recent Labs  ?Lab 09/26/2021 ?0109 08/31/2021 ?2297 09/23/2021 ?0432  ?NA 131*  --  131*  ?K 4.4  --  4.5  ?CL 99  --  99  ?CO2 22  --  21*  ?GLUCOSE 185*  --  229*  ?BUN 28*  --  27*  ?CREATININE 1.46*  --  1.47*  ?CALCIUM 8.6*  --  8.5*  ?MG  --  2.0  --   ?PHOS  --   --  2.6  ? ?GFR: ?Estimated Creatinine Clearance: 42.5 mL/min (A) (by C-G formula based on SCr of 1.47 mg/dL (H)). ?Recent Labs  ?Lab 09/25/2021 ?0109 09/12/2021 ?0432 08/30/2021 ?0753 09/11/2021 ?0940  ?PROCALCITON  --  0.25  --   --   ?  WBC 14.0* 14.1*  --   --   ?LATICACIDVEN  --   --  4.5* 3.3*  ? ? ?Liver Function Tests: ?Recent Labs  ?Lab 09/03/2021 ?5638  ?AST 29  ?ALT 17  ?ALKPHOS 46  ?BILITOT 0.9  ?PROT 6.9  ?ALBUMIN 2.9*  ? ?No results for input(s): LIPASE, AMYLASE in the last 168 hours. ?No results for input(s): AMMONIA in the last 168 hours. ? ?ABG ?   ?Component Value Date/Time  ? PHART 7.27 (L) 09/05/2021 0548  ? PCO2ART 43 08/31/2021 0548  ? PO2ART 83 09/19/2021 0548  ? HCO3 19.7 (L) 08/29/2021 0548  ? TCO2 26 06/14/2019 1232  ? ACIDBASEDEF 7.2 (H) 09/15/2021 0548  ? O2SAT 96.5 09/05/2021 0548  ?  ? ?Coagulation Profile: ?No results for input(s): INR, PROTIME in the last 168 hours. ? ?Cardiac Enzymes: ?No results for input(s): CKTOTAL, CKMB, CKMBINDEX, TROPONINI in the last  168 hours. ? ?HbA1C: ?No results found for: HGBA1C ? ?CBG: ?No results for input(s): GLUCAP in the last 168 hours. ? ?Review of Systems:   ? ?Positive Symptoms in bold: ? ?Constitutional fevers, chills, weight loss, fatigue, anorexia, malaise  ?Eyes decreased vision, double vision, eye irritation  ?Ears, Nose, Mouth, Throat sore throat, trouble swallowing, sinus congestion  ?Cardiovascular chest pain, paroxysmal nocturnal dyspnea, lower ext edema, palpitations ?  ?Respiratory SOB, cough, DOE, hemoptysis, wheezing  ?Gastrointestinal nausea, vomiting, diarrhea  ?Genitourinary burning with urination, trouble urinating  ?Musculoskeletal joint aches, joint swelling, back pain  ?Integumentary  rashes, skin lesions  ?Neurological focal weakness, focal numbness, trouble speaking, headaches  ?Psychiatric depression, anxiety, confusion  ?Endocrine polyuria, polydipsia, cold intolerance, heat intolerance  ?Hematologic abnormal bruising, abnormal bleeding, unexplained nose bleeds  ?Allergic/Immunologic recurrent infections, hives, swollen lymph nodes  ? ? ? ?Past Medical History:  ?He,  has a past medical history of Adenomatous colon polyp, Chronic respiratory failure with hypoxia ([place]), Collapsed lung (04/2018), Diverticulosis, GERD (gastroesophageal reflux disease), Hemorrhoids, Hiatal hernia, Hypertension, ILD (interstitial lung disease) ([place]), OSA on CPAP, Pneumonia (11/2019), Rheumatoid arthritis ([place]), and S/P dilatation of esophageal stricture.  ? ?Surgical History:  ? ?Past Surgical History:  ?Procedure Laterality Date  ? CIRCUMCISION    ? HEMORRHOID SURGERY  2002  ? PLEURADESIS Right 06/14/2019  ? Procedure: PLEURADESIS USING TALC;  Surgeon: Prescott Gum, Collier Salina, MD;  Location: [facility];  Service: Thoracic;  Laterality: Right;  ? STAPLING OF BLEBS Right 06/14/2019  ? Procedure: STAPLING OF BLEBS;  Surgeon: [name], MD;  Location: [facility];  Service: Thoracic;  Laterality: Right;  ? VIDEO ASSISTED THORACOSCOPY Right  06/14/2019  ? Procedure: VIDEO ASSISTED THORACOSCOPY WITH RIGHT UPPER LOBE WEDGE;  Surgeon: [name], MD;  Location: [facility];  Service: Thoracic;  Laterality: Right;  ?  ? ?Social History:  ? reports that he has never smoked. He has been exposed to tobacco smoke. He has never used smokeless tobacco. He reports that he does not drink alcohol and does not use drugs.  ? ?Family History:  ?His family history includes Prostate cancer in his paternal uncle. There is no history of Colon cancer, Throat cancer, Diabetes, Kidney disease, or Liver disease.  ? ?Allergies ?Allergies  ?Allergen Reactions  ? Wilder Glade [Dapagliflozin] Other (See Comments)  ?  Frequent urination ?Difficulty breathing ?Rapid heartbeat  ? Ofev [Nintedanib] Diarrhea  ?  Severe diarrhea  ?  ? ?Home Medications  ?Prior to Admission medications   ?Medication Sig Start Date End Date Taking? Authorizing Provider  ?acetaminophen (TYLENOL)  500 MG tablet Take 2 tablets (1,000 mg total) by mouth every 6 (six) hours. ?Patient taking differently: Take 1,000 mg by mouth every 6 (six) hours as needed. 06/18/19  Yes Conte, Tessa N, PA-C  ?albuterol (PROVENTIL) (2.5 MG/3ML) 0.083% nebulizer solution Take 3 mLs (2.5 mg total) by nebulization 2 (two) times daily as needed for wheezing or shortness of breath. 08/20/21  Yes Parrett, Tammy S, NP  ?amLODipine (NORVASC) 10 MG tablet Take 1 tablet (10 mg total) by mouth daily. 11/08/15  Yes Thurnell Lose, MD  ?aspirin EC 81 MG tablet Take 81 mg by mouth daily.   Yes [provider]  ?atorvastatin (LIPITOR) 10 MG tablet Take 10 mg by mouth daily. 06/09/21  Yes [provider]  ?azaTHIOprine (IMURAN) 50 MG tablet Take 50 mg by mouth daily. 07/25/21  Yes [provider]  ?cetirizine (ZYRTEC) 10 MG tablet Take 1 tablet (10 mg total) by mouth 2 (two) times daily as needed for allergies (Can take an extra dose during flare ups). Take 1 tablet 1-2 times daily as needed. ?Patient taking differently: Take  10 mg by mouth 2 (two) times daily as needed for allergies. 03/20/21  Yes Kozlow, Donnamarie Poag, MD  ?folic acid (FOLVITE) 1 MG tablet Take 1 mg by mouth daily.   Yes [provider]  ?montelukast (SINGUL

## 2021-09-01 NOTE — Assessment & Plan Note (Addendum)
Holding HCTZ and valstartan ?norvasc if able to take PO ?  ?  ?  ?

## 2021-09-01 NOTE — Progress Notes (Signed)
RT NOTE:  ? ?Pt titrated off BIPAP to 4L [place]. Pt tolerated for approximately 5 minutes before SpO2 dropped to 90% and RR increased back to mid 40's. Pt put back on BIPAP.  ?

## 2021-09-01 NOTE — Progress Notes (Signed)
Received pt from ED to 2c07 on Bipap via stretcher.  Pt able to move from stretcher to bed without difficulty but immediately became SOB and stating he couldn't breathe on bipap.  Resp therapy at bedside with transport.  HR 120-140 ST, bp 168/86 (107), RR 30-40's, sats 93% on BiPAP.  Dr. Marlyce Huge paged and made aware of pt status.  15 minutes after pt arrive, pt states breathing is better, RR 25, 95% sat, HR remains 125 ST.  Breathing treatment given per RT. ?

## 2021-09-01 NOTE — Progress Notes (Signed)
ANTICOAGULATION CONSULT NOTE - Initial Consult ? ?Pharmacy Consult for heparin ?Indication:  rule out VTE ? ?No Known Allergies ? ?Patient Measurements: ?Height: '5\' 8"'$  (172.7 cm) ?Weight: 88 kg (194 lb) ?IBW/kg (Calculated) : 68.4 ?Heparin Dosing Weight: 86kg ? ?Vital Signs: ?Temp: 98.9 ?F (37.2 ?C) (05/06 0800) ?Temp Source: Axillary (05/06 0800) ?BP: 137/62 (05/06 0800) ?Pulse Rate: 103 (05/06 0800) ? ?Labs: ?Recent Labs  ?  09/16/2021 ?0109 09/10/2021 ?7322 09/03/2021 ?0432  ?HGB 12.0*  --  11.1*  ?HCT 37.8*  --  34.5*  ?PLT 165  --  158  ?CREATININE 1.46*  --  1.47*  ?TROPONINIHS 13 16  --   ? ? ?Estimated Creatinine Clearance: 42.5 mL/min (A) (by C-G formula based on SCr of 1.47 mg/dL (H)). ? ? ?Medical History: ?Past Medical History:  ?Diagnosis Date  ? Adenomatous colon polyp   ? Chronic respiratory failure with hypoxia (HCC)   ? on continuous 3 L [place]  ? Collapsed lung 04/2018  ? Diverticulosis   ? GERD (gastroesophageal reflux disease)   ? Hemorrhoids   ? internal and external  ? Hiatal hernia   ? Hypertension   ? ILD (interstitial lung disease) ([place])   ? OSA on CPAP   ? Pneumonia 11/2019  ? Rheumatoid arthritis ([place])   ? S/P dilatation of esophageal stricture   ? ? ?Assessment: ?81 year old male admitted with shortness of breath. Patient with past medical history significant for chronic hypoxic respiratory failure, interstitial lung disease, rheumatoid arthritis, hypertension, chronic diastolic heart failure.  ? ?Patient noted to have an elevated D-dimer of >20 this morning so we are starting heparin to rule out VTE. Of note patient takes Serracor-Nk a herbal supplement documented to have fibrinolytic properties and should be taken cautiously in patients on blood thinners. CBC appears normal ? ?Goal of Therapy:  ?Heparin level 0.3-0.7 units/ml ?Monitor platelets by anticoagulation protocol: Yes ?  ?Plan:  ?Give 4000 units bolus x 1 ?Start heparin infusion at 1100 units/hr ?Check anti-Xa level in 8 hours and daily  while on heparin ?Continue to monitor H&H and platelets ? ?Erin Hearing PharmD., BCPS ?Clinical Pharmacist ?09/15/2021 8:31 AM ? ?

## 2021-09-01 NOTE — Assessment & Plan Note (Addendum)
Appears euvolemic ?BNP wnl ?Hold diuretics for now ?Echo with EF 44%, grade 1 diastolic dysfunction ?Repeat limited echo   ?  ?  ?  ?

## 2021-09-01 NOTE — Progress Notes (Addendum)
?PROGRESS NOTE ? ? ? ?[name]  ZLD:[id number] DOB: 1940-06-28 DOA: 09/06/2021 ?PCP: [name], MD  ?Chief Complaint  ?Patient presents with  ? Respiratory Distress  ? ? ?Brief Narrative:  ?[name] is [name] 81 y.o. male with medical history significant for chronic hypoxic respiratory failure on continuous 3 L nasal cannula, interstitial lung disease, rheumatoid arthritis, hypertension, chronic diastolic heart failure, who is admitted to [facility] on 09/26/2021 with acute on chronic hypoxic respiratory failure in the setting of acute exacerbation of his interstitial lung disease after presenting from home to [facility] ED complaining of shortness of breath.   ? ? ?Assessment & Plan: ?  ?Principal Problem: ?  Acute on chronic respiratory failure with hypoxia ([place]) ?Active Problems: ?  Metabolic acidosis ?  Chronic diastolic CHF (congestive heart failure) ([place]) ?  CKD stage G3b/A1, GFR 30-44 and albumin creatinine ratio <30 mg/g (HCC) ?  Essential hypertension ?  GERD ?  Rheumatoid arthritis ([place]) ? ? ?Assessment and Plan: ?* Acute on chronic respiratory failure with hypoxia (HCC) ?Likely ILD exacerbation ?Recent admission, treated with steroids, abx, diuretics for ILD exacerbation +/- superimposed bacterial pneumonia and diastolic HF ?On 3 l chronically at rest, uses 6 L when ambulating per last progress note ?satting 88% on 2 L at arrival, was placed on bipap ?CXR with diffuse interstitial prominence and fibrosis ?He's currently bipap dependent due to work of breathing ?ABG shows metabolic acidosis ?D dimer is >20, follow LE [place], limited echo.  Not stable for CT PE protocol at this time. ?Doesn't appear overloaded, normal BNP ?Negative covid, influenza, RVP ?Follow MRSA PCR ?Urine strep, urine legionella ?Blood cultures ?Continue bipap for wob, scheduled and prn nebs ?Ofev, singulair if able to take po ?Start antibiotics ?Continue steroids ?Heparin for elevated d dimer, concern for possible PE.  ?PCCM consult   ? ?Metabolic acidosis ?ABG shows metabolic acidosis ?Follow lactate, urine without ketones ? ? ?Chronic diastolic CHF (congestive heart failure) ([place]) ?Appears euvolemic ?BNP wnl ?Hold diuretics for now ?Echo with EF 90%, grade 1 diastolic dysfunction ?Repeat limited echo   ?  ?  ?  ? ?Essential hypertension ?Holding HCTZ and valstartan ?norvasc if able to take PO ?  ?  ?  ? ?CKD stage G3b/A1, GFR 30-44 and albumin creatinine ratio <30 mg/g (HCC) ?Creatinine doesn't appear that far from baseline ?follow ? ?GERD ?IV PPI ?  ?  ?  ? ?Rheumatoid arthritis ([place]) ?Azathioprine when able to take PO ?  ?  ? ? ?DVT prophylaxis: heparin gtt ?Code Status: full ?Family Communication: daughter, wife at bedside ?Disposition:  ? ?Status is: Inpatient ?Remains inpatient appropriate because: resp failure ?  ?Consultants:  ?PCCM ? ?Procedures:  ?none ? ?Antimicrobials:  ?Anti-infectives (From admission, onward)  ? ? Start     Dose/Rate Route Frequency Ordered Stop  ? 09/14/2021 0900  cefTRIAXone (ROCEPHIN) 2 g in sodium chloride 0.9 % 100 mL IVPB       ? 2 g ?200 mL/hr over 30 Minutes Intravenous Every 24 hours 09/19/2021 0713 09/06/21 0859  ? 09/03/2021 0800  azithromycin (ZITHROMAX) 500 mg in sodium chloride 0.9 % 250 mL IVPB       ? 500 mg ?250 mL/hr over 60 Minutes Intravenous Every 24 hours 09/03/2021 0713 09/06/21 0759  ? ?  ? ? ?Subjective: ?Denies any complaints ? ?Objective: ?Vitals:  ? 09/19/2021 0330 09/16/2021 0433 09/05/2021 0535 09/09/2021 0800  ?BP:  125/65 (!) 168/86 137/62  ?Pulse: (!) 109 (!) 102 Marland Kitchen)  123 (!) 103  ?Resp: (!) 28 (!) 26 (!) 31 (!) 28  ?Temp:   97.9 ?F (36.6 ?C) 98.9 ?F (37.2 ?C)  ?TempSrc:   Axillary Axillary  ?SpO2: 99% 99% 93% 96%  ?Weight:    88 kg  ?Height:    '5\' 8"'$  (1.727 m)  ? ?No intake or output data in the 24 hours ending 09/22/2021 0831 ?Filed Weights  ? 09/24/2021 0800  ?Weight: 88 kg  ? ? ?Examination: ? ?General exam: Appears calm and comfortable  ?Respiratory system: increased WOB, abdominal breathing, on  bipap - limited exam due to bipap, some scattered wheezing ?Cardiovascular system: tachycardic  ?Gastrointestinal system: belly breathing, s/nt/nd ?Central nervous system: Alert and oriented. No focal neurological deficits. ?Extremities: no LEE ? ? ?Data Reviewed: I have personally reviewed following labs and imaging studies ? ?CBC: ?Recent Labs  ?Lab 08/30/2021 ?0109 09/03/2021 ?7846  ?WBC 14.0* 14.1*  ?NEUTROABS 12.8* 13.5*  ?HGB 12.0* 11.1*  ?HCT 37.8* 34.5*  ?MCV 95.2 94.3  ?PLT 165 158  ? ? ?Basic Metabolic Panel: ?Recent Labs  ?Lab 09/21/2021 ?0109 08/31/2021 ?9629 08/27/2021 ?0432  ?NA 131*  --  131*  ?K 4.4  --  4.5  ?CL 99  --  99  ?CO2 22  --  21*  ?GLUCOSE 185*  --  229*  ?BUN 28*  --  27*  ?CREATININE 1.46*  --  1.47*  ?CALCIUM 8.6*  --  8.5*  ?MG  --  2.0  --   ?PHOS  --   --  2.6  ? ? ?GFR: ?Estimated Creatinine Clearance: 42.5 mL/min (Jermond Burkemper) (by C-G formula based on SCr of 1.47 mg/dL (H)). ? ?Liver Function Tests: ?Recent Labs  ?Lab 08/27/2021 ?5284  ?AST 29  ?ALT 17  ?ALKPHOS 46  ?BILITOT 0.9  ?PROT 6.9  ?ALBUMIN 2.9*  ? ? ?CBG: ?No results for input(s): GLUCAP in the last 168 hours. ? ? ?Recent Results (from the past 240 hour(s))  ?Resp Panel by RT-PCR (Flu Hezakiah Champeau&B, Covid) Nasopharyngeal Swab     Status: None  ? Collection Time: 09/09/2021  4:45 AM  ? Specimen: Nasopharyngeal Swab; Nasopharyngeal(NP) swabs in vial transport medium  ?Result Value Ref Range Status  ? SARS Coronavirus 2 by RT PCR NEGATIVE NEGATIVE Final  ?  Comment: (NOTE) ?SARS-CoV-2 target nucleic acids are NOT DETECTED. ? ?The SARS-CoV-2 RNA is generally detectable in upper respiratory ?specimens during the acute phase of infection. The lowest ?concentration of SARS-CoV-2 viral copies this assay can detect is ?138 copies/mL. Romey Cohea negative result does not preclude SARS-Cov-2 ?infection and should not be used as the sole basis for treatment or ?other patient management decisions. Laniesha Das negative result may occur with  ?improper specimen collection/handling,  submission of specimen other ?than nasopharyngeal swab, presence of viral mutation(s) within the ?areas targeted by this assay, and inadequate number of viral ?copies(<138 copies/mL). Marcas Bowsher negative result must be combined with ?clinical observations, patient history, and epidemiological ?information. The expected result is Negative. ? ?Fact Sheet for Patients:  ?EntrepreneurPulse.com.au ? ?Fact Sheet for Healthcare Providers:  ?IncredibleEmployment.be ? ?This test is no t yet approved or cleared by the [place] FDA and  ?has been authorized for detection and/or diagnosis of SARS-CoV-2 by ?FDA under an Emergency Use Authorization (EUA). This EUA will remain  ?in effect (meaning this test can be used) for the duration of the ?COVID-19 declaration under Section 564(b)(1) of the Act, 21 ?U.S.C.section 360bbb-3(b)(1), unless the authorization is terminated  ?or revoked sooner.  ? ? ?  ?  Influenza Donnita Farina by PCR NEGATIVE NEGATIVE Final  ? Influenza B by PCR NEGATIVE NEGATIVE Final  ?  Comment: (NOTE) ?The Xpert Xpress SARS-CoV-2/FLU/RSV plus assay is intended as an aid ?in the diagnosis of influenza from Nasopharyngeal swab specimens and ?should not be used as Lashaya Kienitz sole basis for treatment. Nasal washings and ?aspirates are unacceptable for Xpert Xpress SARS-CoV-2/FLU/RSV ?testing. ? ?Fact Sheet for Patients: ?EntrepreneurPulse.com.au ? ?Fact Sheet for Healthcare Providers: ?IncredibleEmployment.be ? ?This test is not yet approved or cleared by the [place] FDA and ?has been authorized for detection and/or diagnosis of SARS-CoV-2 by ?FDA under an Emergency Use Authorization (EUA). This EUA will remain ?in effect (meaning this test can be used) for the duration of the ?COVID-19 declaration under Section 564(b)(1) of the Act, 21 U.S.C. ?section 360bbb-3(b)(1), unless the authorization is terminated or ?revoked. ? ?Performed at [facility], [place] [address] [place], [place] ?[address] ?  ?Respiratory (~20 pathogens) panel by PCR     Status: None  ? Collection Time: 09/06/2021  4:45 AM  ? Specimen: Nasopharyngeal Swab; Respiratory  ?Result Value Ref Range [id number]

## 2021-09-01 NOTE — H&P (Signed)
?History and Physical  ? ? ?PLEASE NOTE THAT [employer] SOFTWARE WAS USED IN THE CONSTRUCTION OF THIS NOTE. ? ? ?[name] FUX:[id number] DOB: 10/02/1940 DOA: 09/11/2021 ? ?PCP: [name], MD  ?Patient coming from: home  ? ?I have personally briefly reviewed patient's old medical records in [place] ? ?Chief Complaint: Shortness of breath ? ?HPI: [name] is a 81 y.o. male with medical history significant for chronic hypoxic respiratory failure on continuous 3 L nasal cannula, interstitial lung disease, rheumatoid arthritis, hypertension, chronic diastolic heart failure, who is admitted to [facility] on 09/15/2021 with acute on chronic hypoxic respiratory failure in the setting of acute exacerbation of his interstitial lung disease after presenting from home to [facility] ED complaining of shortness of breath.  ? ?Patient recently hospitalized at [facility] from 08/10/2021 to 08/14/2021 for acute on chronic hypoxic respiratory failure in the setting of acute exacerbation interstitial lung disease.  He was discharged home on 08/14/2021 on an 8 to 10-day taper of prednisone, with final day noted to be 08/31/2021.  He was also discharged on brief course of azithromycin. ? ?He has been shortness of breath since the time of discharge from the hospital, but notes significant worsening of his shortness of breath over the last 2 to 3 days, prompting EMS to be called.  EMS reportedly found the patient's initial O2 sat to be 88% on his baseline 2 L continuous nasal cannula.  He was subsequently brought to [facility] ED via EMS for further evaluation and management of the above, and received Solu-Medrol and nebulizer treatment in route. ? ?Denies any associated orthopnea, PND, or new onset peripheral edema. No recent chest pain, diaphoresis, palpitations, N/V, pre-syncope, or syncope.  Has a baseline nonproductive cough, without any reported recent worsening thereof.  No hemoptysis. ? ?Not associated with  any recent cough, wheezing, hemoptysis, new lower extremity erythema, or calf tenderness. Denies any recent trauma, travel, surgical procedures, or periods of prolonged diminished ambulatory status. No recent melena or hematochezia.  Denies any associated subjective fever, chills, rigors, or generalized myalgias.  No recent dysuria or gross hematuria. ? ?Per chart review, it appears that he underwent VATS with talc pleurodesis in February 2021. ? ? ? ?ED Course:  ?Vital signs in the ED were notable for the following: Upon arrival in the ED, he was empirically started on BiPAP with settings of 14/6 and 40% FiO2; afebrile; initial heart rate 127, with subsequent heart rate decreasing into the range of 10 9-1 12; blood pressure 137/70; respiratory rate 22-28, oxygen saturation 97 to 100% on BiPAP at the above settings. ? ?Labs were notable for the following: BMP notable for the following: Sodium 131 which corrects to approximately 132.5 when taking into account concomitant mild hyperglycemia, bicarbonate 22, creatinine 1.46 relative to most recent prior value of 1.22 on 08/20/2021 and relative to 1.47 on 08/13/2021.  BNP 62 compared to most recent prior value of 187 on 08/13/2021.  Initial high-sensitivity troponin I found to be 13.  CBC notable for white blood cell count of 14,000 with 92% neutrophils, which is compared to most recent prior value of 11,500 on 08/20/2021, hemoglobin 12 compared to 13.44 2423.  Urinalysis showed no white blood cells, nitrate/leukocyte esterase negative, specific gravity 1.020. ? ?Imaging and additional notable ED work-up: EKG shows sinus tachycardia with PACs, heart rate 120, normal intervals, no evidence of T wave or ST changes, including no evidence of ST elevation.  Chest x-ray, in comparison to  most recent prior CXR from 08/10/2021 shows diffuse interstitial prominence/fibrosis, without significant interval change relative to most recent prior EKG, including no evidence of overt  infiltrate, edema, effusion, or pneumothorax. ? ?While in the ED, the following were administered: Duo nebulizer treatment. ? ?Subsequently, the patient was admitted to stepdown unit for further evaluation and management of acute on chronic hypoxic respiratory failure in the setting of acute exacerbation of interstitial lung disease. ? ? ?Review of Systems: As per HPI otherwise 10 point review of systems negative.  ? ?Past Medical History:  ?Diagnosis Date  ? Adenomatous colon polyp   ? Chronic respiratory failure with hypoxia (HCC)   ? on continuous 3 L [place]  ? Collapsed lung 04/2018  ? Diverticulosis   ? GERD (gastroesophageal reflux disease)   ? Hemorrhoids   ? internal and external  ? Hiatal hernia   ? Hypertension   ? ILD (interstitial lung disease) ([place])   ? OSA on CPAP   ? Pneumonia 11/2019  ? Rheumatoid arthritis ([place])   ? S/P dilatation of esophageal stricture   ? ? ?Past Surgical History:  ?Procedure Laterality Date  ? CIRCUMCISION    ? HEMORRHOID SURGERY  2002  ? PLEURADESIS Right 06/14/2019  ? Procedure: PLEURADESIS USING TALC;  Surgeon: Prescott Gum, Collier Salina, MD;  Location: [place];  Service: Thoracic;  Laterality: Right;  ? STAPLING OF BLEBS Right 06/14/2019  ? Procedure: STAPLING OF BLEBS;  Surgeon: [name], MD;  Location: [place];  Service: Thoracic;  Laterality: Right;  ? VIDEO ASSISTED THORACOSCOPY Right 06/14/2019  ? Procedure: VIDEO ASSISTED THORACOSCOPY WITH RIGHT UPPER LOBE WEDGE;  Surgeon: [name], MD;  Location: [place];  Service: Thoracic;  Laterality: Right;  ? ? ?Social History: ? reports that he has never smoked. He has been exposed to tobacco smoke. He has never used smokeless tobacco. He reports that he does not drink alcohol and does not use drugs. ? ? ?No Known Allergies ? ?Family History  ?Problem Relation Age of Onset  ? Prostate cancer Paternal Uncle   ? Colon cancer Neg Hx   ? Throat cancer Neg Hx   ? Diabetes Neg Hx   ? Kidney disease Neg Hx   ? Liver disease Neg Hx    ? ? ?Family history reviewed and not pertinent  ? ? ?Prior to Admission medications   ?Medication Sig Start Date End Date Taking? Authorizing Provider  ?acetaminophen (TYLENOL) 500 MG tablet Take 2 tablets (1,000 mg total) by mouth every 6 (six) hours. ?Patient taking differently: Take 1,000 mg by mouth every 6 (six) hours as needed. 06/18/19   Elgie Collard, PA-C  ?albuterol (PROVENTIL) (2.5 MG/3ML) 0.083% nebulizer solution Take 3 mLs (2.5 mg total) by nebulization 2 (two) times daily as needed for wheezing or shortness of breath. 08/20/21   Parrett, Fonnie Mu, NP  ?amLODipine (NORVASC) 10 MG tablet Take 1 tablet (10 mg total) by mouth daily. 11/08/15   Thurnell Lose, MD  ?aspirin EC 81 MG tablet Take 81 mg by mouth daily.    [provider]  ?atorvastatin (LIPITOR) 10 MG tablet Take 10 mg by mouth daily. 06/09/21   [provider]  ?azaTHIOprine (IMURAN) 50 MG tablet Take 50 mg by mouth daily. 07/25/21   [provider]  ?azithromycin (ZITHROMAX) 500 MG tablet Take 1 tablet (500 mg total) by mouth daily. ?Patient not taking: Reported on 08/20/2021 08/14/21   Thurnell Lose, MD  ?cetirizine (ZYRTEC) 10 MG  tablet Take 1 tablet (10 mg total) by mouth 2 (two) times daily as needed for allergies (Can take an extra dose during flare ups). Take 1 tablet 1-2 times daily as needed. 03/20/21   Kozlow, Donnamarie Poag, MD  ?FARXIGA 10 MG TABS tablet Take 10 mg by mouth daily. 07/01/19   [provider]  ?fluticasone (FLONASE) 50 MCG/ACT nasal spray Place 2 sprays into both nostrils daily. 03/20/21   Kozlow, Donnamarie Poag, MD  ?FOLIC ACID PO Take 1 tablet by mouth daily.    [provider]  ?guaiFENesin (MUCINEX) 600 MG 12 hr tablet Take 1 tablet (600 mg total) by mouth 2 (two) times daily. 06/25/21   Regalado, Belkys A, MD  ?Menthol-Methyl Salicylate (MUSCLE RUB) 10-15 % CREA Apply 1 application. topically daily as needed for muscle pain (shoulder/neck).    [provider]  ?montelukast  (SINGULAIR) 10 MG tablet Take 1 tablet (10 mg total) by mouth at bedtime. 03/20/21   Kozlow, Donnamarie Poag, MD  ?Nintedanib (OFEV) 150 MG CAPS Take 1 capsule by mouth once daily for one week then increase to 1 capsule by mouth tw

## 2021-09-01 NOTE — Progress Notes (Signed)
VASCULAR LAB ? ? ? ?Bilateral lower extremity venous duplex has been performed. ? ?See CV proc for preliminary results. ? ? ?Kitzia Camus, RVT ?08/28/2021, 10:03 AM ? ?

## 2021-09-01 NOTE — ED Triage Notes (Addendum)
Pt BIB EMS from home due to sob. Pt placed on cpap with EMS. Pt placed on bipap on arrival.Pt was 88 on o2 2L. EMS was called to the house earlier today but pt refused to go. EMS gave 20 of albuterol , 1 of atrovent & 125 solumedrol ?

## 2021-09-01 NOTE — Assessment & Plan Note (Addendum)
IV PPI. 

## 2021-09-01 NOTE — Progress Notes (Signed)
RN notified by lab at 0915 that patient had critical lactic acid result of 4.5.  RN paged Dr. Florene Glen at [phone], MD received page and no orders at this time. ?

## 2021-09-01 NOTE — Assessment & Plan Note (Addendum)
ABG shows metabolic acidosis ?Follow lactate, urine without ketones ?

## 2021-09-01 NOTE — Assessment & Plan Note (Signed)
Creatinine doesn't appear that far from baseline ?follow ?

## 2021-09-01 NOTE — Plan of Care (Signed)
?  Problem: Education: ?Goal: Knowledge of General Education information will improve ?Description: Including pain rating scale, medication(s)/side effects and non-pharmacologic comfort measures ?Outcome: Progressing ?  ?Problem: Health Behavior/Discharge Planning: ?Goal: Ability to manage health-related needs will improve ?Outcome: Progressing ?  ?Problem: Clinical Measurements: ?Goal: Ability to maintain clinical measurements within normal limits will improve ?Outcome: Progressing ?Goal: Will remain free from infection ?Outcome: Progressing ?Goal: Cardiovascular complication will be avoided ?Outcome: Progressing ?  ?Problem: Activity: ?Goal: Risk for activity intolerance will decrease ?Outcome: Progressing ?  ?Problem: Nutrition: ?Goal: Adequate nutrition will be maintained ?Outcome: Progressing ?  ?Problem: Elimination: ?Goal: Will not experience complications related to bowel motility ?Outcome: Progressing ?Goal: Will not experience complications related to urinary retention ?Outcome: Progressing ?  ?Problem: Pain Managment: ?Goal: General experience of comfort will improve ?Outcome: Progressing ?  ?Problem: Safety: ?Goal: Ability to remain free from injury will improve ?Outcome: Progressing ?  ?Problem: Skin Integrity: ?Goal: Risk for impaired skin integrity will decrease ?Outcome: Progressing ?  ?Problem: Clinical Measurements: ?Goal: Diagnostic test results will improve ?Outcome: Not Progressing ?Goal: Respiratory complications will improve ?Outcome: Not Progressing ?  ?Problem: Coping: ?Goal: Level of anxiety will decrease ?Outcome: Not Progressing ?  ?

## 2021-09-01 NOTE — Progress Notes (Signed)
This RT was called to pts room for low O2 sats. Upon entering pts room sats were 88% and pts breathing was labored. Pt did not want to go on the BIPAP at this time so RT placed NRB mask on along with HHFNC. Sats increased to 96% and pt stated his breathing felt better. RN at bedside.  ?

## 2021-09-01 NOTE — Progress Notes (Signed)
Echocardiogram ?2D Echocardiogram has been performed. ? ?Oneal Deputy Tayloranne Lekas RDCS ?09/16/2021, 10:33 AM ?

## 2021-09-01 NOTE — Plan of Care (Signed)
  Problem: Education: Goal: Knowledge of General Education information will improve Description: Including pain rating scale, medication(s)/side effects and non-pharmacologic comfort measures Outcome: Progressing   Problem: Nutrition: Goal: Adequate nutrition will be maintained Outcome: Progressing   

## 2021-09-01 NOTE — ED Provider Notes (Signed)
?[place] ?Provider Note ? ? ?CSN: [id number] ?Arrival date & time: 08/27/2021  0000 ? ?  ? ?History ? ?Chief Complaint  ?Patient presents with  ? Respiratory Distress  ? ? ?[name] is a 81 y.o. male. ? ?The history is provided by the patient.  ?He has history of interstitial lung disease, chronic respiratory failure with hypoxia on home oxygen, rheumatoid arthritis, hypertension and comes in because of worsening shortness of breath.  He had been admitted to the hospital on 4/14 with difficulty breathing and states that he has had ongoing dyspnea since leaving the hospital but it got worse yesterday and much worse tonight.  He was brought in by ambulance and states he got a dose of albuterol in the ambulance with some slight improvement.  EMS noted initial oxygen saturation of 88% while on home oxygen and placed him on CPAP with improvement.  He denies fever, chills, sweats.  He denies any chest pain, heaviness, tightness, pressure.  He claims compliance with his medications. ?  ?Home Medications ?Prior to Admission medications   ?Medication Sig Start Date End Date Taking? Authorizing Provider  ?acetaminophen (TYLENOL) 500 MG tablet Take 2 tablets (1,000 mg total) by mouth every 6 (six) hours. ?Patient taking differently: Take 1,000 mg by mouth every 6 (six) hours as needed. 06/18/19   Elgie Collard, PA-C  ?albuterol (PROVENTIL) (2.5 MG/3ML) 0.083% nebulizer solution Take 3 mLs (2.5 mg total) by nebulization 2 (two) times daily as needed for wheezing or shortness of breath. 08/20/21   Parrett, Fonnie Mu, NP  ?amLODipine (NORVASC) 10 MG tablet Take 1 tablet (10 mg total) by mouth daily. 11/08/15   Thurnell Lose, MD  ?aspirin EC 81 MG tablet Take 81 mg by mouth daily.    [provider]  ?atorvastatin (LIPITOR) 10 MG tablet Take 10 mg by mouth daily. 06/09/21   [provider]  ?azaTHIOprine (IMURAN) 50 MG tablet Take 50 mg by mouth daily. 07/25/21    [provider]  ?azithromycin (ZITHROMAX) 500 MG tablet Take 1 tablet (500 mg total) by mouth daily. ?Patient not taking: Reported on 08/20/2021 08/14/21   Thurnell Lose, MD  ?cetirizine (ZYRTEC) 10 MG tablet Take 1 tablet (10 mg total) by mouth 2 (two) times daily as needed for allergies (Can take an extra dose during flare ups). Take 1 tablet 1-2 times daily as needed. 03/20/21   Kozlow, Donnamarie Poag, MD  ?FARXIGA 10 MG TABS tablet Take 10 mg by mouth daily. 07/01/19   [provider]  ?fluticasone (FLONASE) 50 MCG/ACT nasal spray Place 2 sprays into both nostrils daily. 03/20/21   Kozlow, Donnamarie Poag, MD  ?FOLIC ACID PO Take 1 tablet by mouth daily.    [provider]  ?guaiFENesin (MUCINEX) 600 MG 12 hr tablet Take 1 tablet (600 mg total) by mouth 2 (two) times daily. 06/25/21   Regalado, Belkys A, MD  ?Menthol-Methyl Salicylate (MUSCLE RUB) 10-15 % CREA Apply 1 application. topically daily as needed for muscle pain (shoulder/neck).    [provider]  ?montelukast (SINGULAIR) 10 MG tablet Take 1 tablet (10 mg total) by mouth at bedtime. 03/20/21   Kozlow, Donnamarie Poag, MD  ?Nintedanib (OFEV) 150 MG CAPS Take 1 capsule by mouth once daily for one week then increase to 1 capsule by mouth twice daily thereafter. ?Patient taking differently: Take 150 mg by mouth See admin instructions. Take 1 capsule by mouth once daily for one week then increase  to 1 capsule by mouth twice daily thereafter. 08/02/21   Brand Males, MD  ?Olopatadine HCl (PATADAY) 0.2 % SOLN Place 1 drop into both eyes daily. Use 1 drop in each eye once daily as needed. ?Patient taking differently: Place 1 drop into both eyes daily. 03/20/21   Kozlow, Donnamarie Poag, MD  ?omeprazole (PRILOSEC) 20 MG capsule TAKE 1 CAPSULE BY MOUTH EVERY DAY ?Patient taking differently: Take 20 mg by mouth daily. 02/16/21   Irene Shipper, MD  ?predniSONE (DELTASONE) 5 MG tablet Take 8 Pills by mouth once daily for 3 days, then 6 Pills by mouth for 3  days, then 4 Pills by mouth for 3 days, then 2 Pills by mouth for 3 days, then 1 Pills by mouth for 3 days, then  1/2 Pill  by mouth for 3 days then STOP. 08/14/21   Thurnell Lose, MD  ?valsartan-hydrochlorothiazide (DIOVAN-HCT) 320-25 MG tablet Take 1 tablet by mouth daily. 07/21/21   [provider]  ?   ? ?Allergies    ?Patient has no known allergies.   ? ?Review of Systems   ?Review of Systems  ?All other systems reviewed and are negative. ? ?Physical Exam ?Updated Vital Signs ?BP (!) 143/69   Pulse (!) 124   Temp (!) 97.5 ?F (36.4 ?C)   Resp (!) 22   SpO2 100%  ?Physical Exam ?Vitals and nursing note reviewed.  ?81 year old male, resting comfortably on BiPAP, and in no acute distress. Vital signs are significant for elevated heart rate, respiratory rate, blood pressure. Oxygen saturation is 100%, which is normal. ?Head is normocephalic and atraumatic. PERRLA, EOMI. Oropharynx is clear. ?Neck is nontender and supple without adenopathy or JVD. ?Back is nontender and there is no CVA tenderness.  There is 2+ presacral edema. ?Lungs have scattered wheezes without rales or rhonchi. ?Chest is nontender. ?Heart is tachycardic without murmur. ?Abdomen is soft, flat, nontender. ?Extremities have trace pretibial edema, full range of motion is present. ?Skin is warm and dry without rash. ?Neurologic: Mental status is normal, cranial nerves are intact, moves all extremities equally. ? ?ED Results / Procedures / Treatments   ?Labs ?(all labs ordered are listed, but only abnormal results are displayed) ?Labs Reviewed  ?BRAIN NATRIURETIC PEPTIDE  ?BASIC METABOLIC PANEL  ?CBC WITH DIFFERENTIAL/PLATELET  ?TROPONIN I (HIGH SENSITIVITY)  ? ? ?EKG ?EKG Interpretation ? ?Date/Time:  Saturday Sep 01 2021 00:05:16 EDT ?Ventricular Rate:  128 ?PR Interval:  166 ?QRS Duration: 80 ?QT Interval:  283 ?QTC Calculation: 413 ?R Axis:   93 ?Text Interpretation: Sinus tachycardia Atrial premature complex LAE, consider biatrial  enlargement Right axis deviation When compared with ECG of 08/10/2021, HEART RATE has increased Confirmed by Delora Fuel ([id number]) on 09/17/2021 12:33:24 AM ? ?Radiology ?DG Chest Port 1 View ? ?Result Date: 09/19/2021 ?CLINICAL DATA:  Shortness of breath. EXAM: PORTABLE CHEST 1 VIEW COMPARISON:  Chest radiograph dated 08/10/2021. FINDINGS: Diffuse interstitial prominence and fibrosis. Faint increased density in the subpleural right lung may be artifactual. Developing infiltrate is not excluded. No new consolidative changes. There is no pleural effusion pneumothorax. The cardiac silhouette is within limits. Atherosclerotic calcification of the aorta. No acute osseous pathology. IMPRESSION: Diffuse interstitial prominence and fibrosis. No consolidative changes. Electronically Signed   By: Anner Crete M.D.   On: 09/06/2021 01:10   ? ?Procedures ?Procedures  ?Cardiac monitor shows sinus tachycardia, per my interpretation.  ECG shows sinus tachycardia, but no ST or T changes. ? ?Medications Ordered in  ED ?Medications  ?albuterol (PROVENTIL) (2.5 MG/3ML) 0.083% nebulizer solution 5 mg (has no administration in time range)  ?ipratropium (ATROVENT) nebulizer solution 0.5 mg (has no administration in time range)  ?methylPREDNISolone sodium succinate (SOLU-MEDROL) 125 mg/2 mL injection 125 mg (has no administration in time range)  ? ? ?ED Course/ Medical Decision Making/ A&P ?  ?                        ?Medical Decision Making ?Amount and/or Complexity of Data Reviewed ?Labs: ordered. ?Radiology: ordered. ? ?Risk ?Prescription drug management. ? ? ?Exacerbation of interstitial lung disease with bronchospasm.  Presence of edema does raise possibility of heart failure.  Also consider possibility of pneumonia, pneumothorax.  Old records are reviewed showing he does have history of pneumothorax and confirms recent hospitalization for interstitial lung disease exacerbation.  He does not seem to be getting much assistance from  BiPAP, but he we will maintain him on that since he is more comfortable.  He will be given albuterol with ipratropium via nebulizer, check chest x-ray and screening labs.  Will check BNP to rule out heart failure contrib

## 2021-09-01 NOTE — Hospital Course (Signed)
[name] is [name] 81 y.o. male with medical history significant for chronic hypoxic respiratory failure on continuous 3 L nasal cannula, interstitial lung disease, rheumatoid arthritis, hypertension, chronic diastolic heart failure, who is admitted to [facility] on 08/28/2021 with acute on chronic hypoxic respiratory failure in the setting of acute exacerbation of his interstitial lung disease after presenting from home to [facility] ED complaining of shortness of breath.  ?

## 2021-09-02 ENCOUNTER — Inpatient Hospital Stay (HOSPITAL_COMMUNITY): Payer: Medicare Other

## 2021-09-02 DIAGNOSIS — J9621 Acute and chronic respiratory failure with hypoxia: Secondary | ICD-10-CM | POA: Diagnosis not present

## 2021-09-02 LAB — SEDIMENTATION RATE: Sed Rate: 92 mm/hr — ABNORMAL HIGH (ref 0–16)

## 2021-09-02 LAB — GLUCOSE, CAPILLARY
Glucose-Capillary: 143 mg/dL — ABNORMAL HIGH (ref 70–99)
Glucose-Capillary: 146 mg/dL — ABNORMAL HIGH (ref 70–99)
Glucose-Capillary: 220 mg/dL — ABNORMAL HIGH (ref 70–99)
Glucose-Capillary: 236 mg/dL — ABNORMAL HIGH (ref 70–99)

## 2021-09-02 LAB — POCT I-STAT 7, (LYTES, BLD GAS, ICA,H+H)
Acid-Base Excess: 3 mmol/L — ABNORMAL HIGH (ref 0.0–2.0)
Acid-Base Excess: 1 mmol/L (ref 0.0–2.0)
Acid-base deficit: 4 mmol/L — ABNORMAL HIGH (ref 0.0–2.0)
Acid-base deficit: 3 mmol/L — ABNORMAL HIGH (ref 0.0–2.0)
Bicarbonate: 28.3 mmol/L — ABNORMAL HIGH (ref 20.0–28.0)
Bicarbonate: 29 mmol/L — ABNORMAL HIGH (ref 20.0–28.0)
Bicarbonate: 30.5 mmol/L — ABNORMAL HIGH (ref 20.0–28.0)
Bicarbonate: 31.2 mmol/L — ABNORMAL HIGH (ref 20.0–28.0)
Calcium, Ion: 1.28 mmol/L (ref 1.15–1.40)
Calcium, Ion: 1.27 mmol/L (ref 1.15–1.40)
Calcium, Ion: 1.15 mmol/L (ref 1.15–1.40)
Calcium, Ion: 1.15 mmol/L (ref 1.15–1.40)
HCT: 39 % (ref 39.0–52.0)
HCT: 39 % (ref 39.0–52.0)
HCT: 33 % — ABNORMAL LOW (ref 39.0–52.0)
HCT: 31 % — ABNORMAL LOW (ref 39.0–52.0)
Hemoglobin: 13.3 g/dL (ref 13.0–17.0)
Hemoglobin: 13.3 g/dL (ref 13.0–17.0)
Hemoglobin: 11.2 g/dL — ABNORMAL LOW (ref 13.0–17.0)
Hemoglobin: 10.5 g/dL — ABNORMAL LOW (ref 13.0–17.0)
O2 Saturation: 99 %
O2 Saturation: 93 %
O2 Saturation: 90 %
O2 Saturation: 98 %
Patient temperature: 99
Patient temperature: 97.3
Patient temperature: 36.4
Potassium: 4.7 mmol/L (ref 3.5–5.1)
Potassium: 5 mmol/L (ref 3.5–5.1)
Potassium: 5.1 mmol/L (ref 3.5–5.1)
Potassium: 5 mmol/L (ref 3.5–5.1)
Sodium: 137 mmol/L (ref 135–145)
Sodium: 137 mmol/L (ref 135–145)
Sodium: 138 mmol/L (ref 135–145)
Sodium: 137 mmol/L (ref 135–145)
TCO2: 31 mmol/L (ref 22–32)
TCO2: 32 mmol/L (ref 22–32)
TCO2: 32 mmol/L (ref 22–32)
TCO2: 34 mmol/L — ABNORMAL HIGH (ref 22–32)
pCO2 arterial: 90.7 mmHg (ref 32–48)
pCO2 arterial: 88.2 mmHg (ref 32–48)
pCO2 arterial: 63.3 mmHg — ABNORMAL HIGH (ref 32–48)
pCO2 arterial: 83.8 mmHg (ref 32–48)
pH, Arterial: 7.104 — CL (ref 7.35–7.45)
pH, Arterial: 7.12 — CL (ref 7.35–7.45)
pH, Arterial: 7.29 — ABNORMAL LOW (ref 7.35–7.45)
pH, Arterial: 7.175 — CL (ref 7.35–7.45)
pO2, Arterial: 169 mmHg — ABNORMAL HIGH (ref 83–108)
pO2, Arterial: 90 mmHg (ref 83–108)
pO2, Arterial: 67 mmHg — ABNORMAL LOW (ref 83–108)
pO2, Arterial: 136 mmHg — ABNORMAL HIGH (ref 83–108)

## 2021-09-02 LAB — BODY FLUID CELL COUNT WITH DIFFERENTIAL
Eos, Fluid: 0 %
Lymphs, Fluid: 7 %
Monocyte-Macrophage-Serous Fluid: 26 % — ABNORMAL LOW (ref 50–90)
Neutrophil Count, Fluid: 67 % — ABNORMAL HIGH (ref 0–25)
Total Nucleated Cell Count, Fluid: 153 cu mm (ref 0–1000)

## 2021-09-02 LAB — BLOOD GAS, ARTERIAL
Acid-base deficit: 5.2 mmol/L — ABNORMAL HIGH (ref 0.0–2.0)
Bicarbonate: 21.6 mmol/L (ref 20.0–28.0)
O2 Saturation: 96.2 %
Patient temperature: 37.2
pCO2 arterial: 46 mmHg (ref 32–48)
pH, Arterial: 7.28 — ABNORMAL LOW (ref 7.35–7.45)
pO2, Arterial: 77 mmHg — ABNORMAL LOW (ref 83–108)

## 2021-09-02 LAB — BASIC METABOLIC PANEL
Anion gap: 10 (ref 5–15)
BUN: 20 mg/dL (ref 8–23)
CO2: 22 mmol/L (ref 22–32)
Calcium: 8.8 mg/dL — ABNORMAL LOW (ref 8.9–10.3)
Chloride: 104 mmol/L (ref 98–111)
Creatinine, Ser: 1.15 mg/dL (ref 0.61–1.24)
GFR, Estimated: >60 mL/min (ref >60)
Glucose, Bld: 161 mg/dL — ABNORMAL HIGH (ref 70–99)
Potassium: 4.6 mmol/L (ref 3.5–5.1)
Sodium: 136 mmol/L (ref 135–145)

## 2021-09-02 LAB — CBC
HCT: 35.4 % — ABNORMAL LOW (ref 39.0–52.0)
Hemoglobin: 12.1 g/dL — ABNORMAL LOW (ref 13.0–17.0)
MCH: 31 pg (ref 26.0–34.0)
MCHC: 34.2 g/dL (ref 30.0–36.0)
MCV: 90.8 fL (ref 80.0–100.0)
Platelets: 162 10*3/uL (ref 150–400)
RBC: 3.9 MIL/uL — ABNORMAL LOW (ref 4.22–5.81)
RDW: 15.9 % — ABNORMAL HIGH (ref 11.5–15.5)
WBC: 18.8 10*3/uL — ABNORMAL HIGH (ref 4.0–10.5)
nRBC: 0 % (ref 0.0–0.2)

## 2021-09-02 LAB — BRAIN NATRIURETIC PEPTIDE: B Natriuretic Peptide: 197.5 pg/mL — ABNORMAL HIGH (ref 0.0–100.0)

## 2021-09-02 LAB — MAGNESIUM: Magnesium: 2.1 mg/dL (ref 1.7–2.4)

## 2021-09-02 LAB — LACTATE DEHYDROGENASE: LDH: 662 U/L — ABNORMAL HIGH (ref 98–192)

## 2021-09-02 LAB — HEMOGLOBIN A1C
Hgb A1c MFr Bld: 6.4 % — ABNORMAL HIGH (ref 4.8–5.6)
Mean Plasma Glucose: 136.98 mg/dL

## 2021-09-02 LAB — LACTIC ACID, PLASMA: Lactic Acid, Venous: 1.4 mmol/L (ref 0.5–1.9)

## 2021-09-02 MED ORDER — GUAIFENESIN-DM 100-10 MG/5ML PO SYRP: 5.0000 mL | ORAL_SOLUTION | ORAL | Status: DC | PRN

## 2021-09-02 MED ORDER — FUROSEMIDE 10 MG/ML IJ SOLN
40.0000 mg | Freq: Once | INTRAMUSCULAR | Status: AC
  Administered 2021-09-02: 40 mg via INTRAVENOUS

## 2021-09-02 MED ORDER — FENTANYL CITRATE PF 50 MCG/ML IJ SOSY
25.0000 ug | PREFILLED_SYRINGE | Freq: Once | INTRAMUSCULAR | Status: AC
  Administered 2021-09-02: 25 ug via INTRAVENOUS
  Filled 2021-09-02: qty 1

## 2021-09-02 MED ORDER — FENTANYL BOLUS VIA INFUSION
25.0000 ug | INTRAVENOUS | Status: DC | PRN
  Filled 2021-09-02: qty 100

## 2021-09-02 MED ORDER — NOREPINEPHRINE 16 MG/250ML-% IV SOLN
0.0000 ug/min | INTRAVENOUS | Status: DC
  Administered 2021-09-02: 5 ug/min via INTRAVENOUS
  Administered 2021-09-03: 44 ug/min via INTRAVENOUS
  Administered 2021-09-03: 33 ug/min via INTRAVENOUS
  Administered 2021-09-03 – 2021-09-04 (×3): 42 ug/min via INTRAVENOUS
  Filled 2021-09-02 (×6): qty 250

## 2021-09-02 MED ORDER — LORAZEPAM 2 MG/ML IJ SOLN
0.5000 mg | Freq: Once | INTRAMUSCULAR | Status: AC
  Administered 2021-09-02: 0.5 mg via INTRAVENOUS
  Filled 2021-09-02: qty 1

## 2021-09-02 MED ORDER — SODIUM BICARBONATE 8.4 % IV SOLN
100.0000 meq | Freq: Once | INTRAVENOUS | Status: AC
  Administered 2021-09-02: 100 meq via INTRAVENOUS
  Filled 2021-09-02: qty 100

## 2021-09-02 MED ORDER — POLYETHYLENE GLYCOL 3350 17 G PO PACK
17.0000 g | PACK | Freq: Every day | ORAL | Status: DC
  Administered 2021-09-02 – 2021-09-03 (×2): 17 g
  Filled 2021-09-02 (×2): qty 1

## 2021-09-02 MED ORDER — ENOXAPARIN SODIUM 40 MG/0.4ML IJ SOSY
40.0000 mg | PREFILLED_SYRINGE | Freq: Every day | INTRAMUSCULAR | Status: DC
  Administered 2021-09-02 – 2021-09-03 (×2): 40 mg via SUBCUTANEOUS
  Filled 2021-09-02 (×2): qty 0.4

## 2021-09-02 MED ORDER — ATORVASTATIN CALCIUM 10 MG PO TABS
10.0000 mg | ORAL_TABLET | Freq: Every day | ORAL | Status: DC
  Administered 2021-09-02 – 2021-09-03 (×2): 10 mg
  Filled 2021-09-02 (×2): qty 1

## 2021-09-02 MED ORDER — ACETAMINOPHEN 650 MG RE SUPP
650.0000 mg | Freq: Four times a day (QID) | RECTAL | Status: DC | PRN
Start: 2021-09-02 — End: 2021-09-04

## 2021-09-02 MED ORDER — VANCOMYCIN HCL 1250 MG/250ML IV SOLN
1250.0000 mg | INTRAVENOUS | Status: DC
  Administered 2021-09-02: 1250 mg via INTRAVENOUS
  Filled 2021-09-02 (×2): qty 250

## 2021-09-02 MED ORDER — ASPIRIN 81 MG PO CHEW
81.0000 mg | CHEWABLE_TABLET | Freq: Every day | ORAL | Status: DC
  Administered 2021-09-02 – 2021-09-03 (×2): 81 mg
  Filled 2021-09-02 (×2): qty 1

## 2021-09-02 MED ORDER — SODIUM CHLORIDE 0.9 % IV SOLN
0.0000 ug/kg/min | INTRAVENOUS | Status: DC
  Administered 2021-09-02 – 2021-09-03 (×2): 3 ug/kg/min via INTRAVENOUS
  Filled 2021-09-02 (×2): qty 20

## 2021-09-02 MED ORDER — HYDROCORTISONE SOD SUC (PF) 100 MG IJ SOLR: 100.0000 mg | Freq: Two times a day (BID) | INTRAMUSCULAR | Status: DC

## 2021-09-02 MED ORDER — ARTIFICIAL TEARS OPHTHALMIC OINT
1.0000 "application " | TOPICAL_OINTMENT | Freq: Three times a day (TID) | OPHTHALMIC | Status: DC
  Administered 2021-09-02 – 2021-09-04 (×6): 1 via OPHTHALMIC
  Filled 2021-09-02 (×3): qty 3.5

## 2021-09-02 MED ORDER — FENTANYL CITRATE PF 50 MCG/ML IJ SOSY
100.0000 ug | PREFILLED_SYRINGE | INTRAMUSCULAR | Status: AC
  Administered 2021-09-02: 100 ug via INTRAVENOUS

## 2021-09-02 MED ORDER — FUROSEMIDE 10 MG/ML IJ SOLN: 40.0000 mg | Freq: Once | INTRAMUSCULAR | Status: DC

## 2021-09-02 MED ORDER — ETOMIDATE 2 MG/ML IV SOLN
20.0000 mg | INTRAVENOUS | Status: AC
  Administered 2021-09-02: 20 mg via INTRAVENOUS

## 2021-09-02 MED ORDER — FUROSEMIDE 10 MG/ML IJ SOLN
INTRAMUSCULAR | Status: AC
  Filled 2021-09-02: qty 4

## 2021-09-02 MED ORDER — FUROSEMIDE 10 MG/ML IJ SOLN
40.0000 mg | Freq: Three times a day (TID) | INTRAMUSCULAR | Status: DC
Start: 2021-09-02 — End: 2021-09-03
  Administered 2021-09-02 – 2021-09-03 (×3): 40 mg via INTRAVENOUS
  Filled 2021-09-02 (×4): qty 4

## 2021-09-02 MED ORDER — CHLORHEXIDINE GLUCONATE CLOTH 2 % EX PADS
6.0000 | MEDICATED_PAD | Freq: Every day | CUTANEOUS | Status: DC
  Administered 2021-09-02 – 2021-09-04 (×3): 6 via TOPICAL

## 2021-09-02 MED ORDER — DOCUSATE SODIUM 50 MG/5ML PO LIQD
100.0000 mg | Freq: Two times a day (BID) | ORAL | Status: DC
  Administered 2021-09-02 – 2021-09-03 (×4): 100 mg
  Filled 2021-09-02 (×4): qty 10

## 2021-09-02 MED ORDER — ORAL CARE MOUTH RINSE
15.0000 mL | OROMUCOSAL | Status: DC
  Administered 2021-09-02 – 2021-09-04 (×20): 15 mL via OROMUCOSAL

## 2021-09-02 MED ORDER — CISATRACURIUM BOLUS VIA INFUSION
10.0000 mg | Freq: Once | INTRAVENOUS | Status: AC
  Administered 2021-09-02: 10 mg via INTRAVENOUS
  Filled 2021-09-02: qty 10

## 2021-09-02 MED ORDER — ROCURONIUM BROMIDE 50 MG/5ML IV SOLN
100.0000 mg | INTRAVENOUS | Status: AC
  Administered 2021-09-02: 100 mg via INTRAVENOUS
  Filled 2021-09-02: qty 10

## 2021-09-02 MED ORDER — ACETAMINOPHEN 160 MG/5ML PO SOLN: 650.0000 mg | Freq: Four times a day (QID) | ORAL | Status: DC | PRN

## 2021-09-02 MED ORDER — STERILE WATER FOR INJECTION IJ SOLN
50.0000 ng/kg/min | INTRAVENOUS | Status: DC
  Filled 2021-09-02 (×3): qty 5

## 2021-09-02 MED ORDER — CHLORHEXIDINE GLUCONATE 0.12% ORAL RINSE (MEDLINE KIT)
15.0000 mL | Freq: Two times a day (BID) | OROMUCOSAL | Status: DC
  Administered 2021-09-02 – 2021-09-04 (×5): 15 mL via OROMUCOSAL

## 2021-09-02 MED ORDER — FENTANYL 2500MCG IN NS 250ML (10MCG/ML) PREMIX INFUSION
25.0000 ug/h | INTRAVENOUS | Status: DC
  Administered 2021-09-02: 50 ug/h via INTRAVENOUS
  Administered 2021-09-02 – 2021-09-04 (×4): 200 ug/h via INTRAVENOUS
  Filled 2021-09-02 (×6): qty 250

## 2021-09-02 MED ORDER — AMLODIPINE BESYLATE 10 MG PO TABS
10.0000 mg | ORAL_TABLET | Freq: Every day | ORAL | Status: DC
  Administered 2021-09-02: 10 mg
  Filled 2021-09-02: qty 1

## 2021-09-02 MED ORDER — SODIUM CHLORIDE 0.9 % IV SOLN
2.0000 g | Freq: Two times a day (BID) | INTRAVENOUS | Status: DC
  Administered 2021-09-02 – 2021-09-03 (×3): 2 g via INTRAVENOUS
  Filled 2021-09-02 (×3): qty 12.5

## 2021-09-02 MED ORDER — PANTOPRAZOLE 2 MG/ML SUSPENSION
40.0000 mg | Freq: Every day | ORAL | Status: DC
  Administered 2021-09-02 – 2021-09-03 (×2): 40 mg
  Filled 2021-09-02 (×2): qty 20

## 2021-09-02 MED ORDER — PROPOFOL 1000 MG/100ML IV EMUL
0.0000 ug/kg/min | INTRAVENOUS | Status: DC
  Administered 2021-09-02: 20 ug/kg/min via INTRAVENOUS
  Administered 2021-09-02: 50 ug/kg/min via INTRAVENOUS
  Administered 2021-09-02: 35 ug/kg/min via INTRAVENOUS
  Administered 2021-09-02: 5 ug/kg/min via INTRAVENOUS
  Administered 2021-09-03: 35 ug/kg/min via INTRAVENOUS
  Administered 2021-09-03: 40 ug/kg/min via INTRAVENOUS
  Administered 2021-09-03: 25 ug/kg/min via INTRAVENOUS
  Administered 2021-09-03: 40 ug/kg/min via INTRAVENOUS
  Administered 2021-09-03: 35 ug/kg/min via INTRAVENOUS
  Administered 2021-09-04 (×2): 40 ug/kg/min via INTRAVENOUS
  Filled 2021-09-02 (×2): qty 100
  Filled 2021-09-02: qty 200
  Filled 2021-09-02 (×7): qty 100

## 2021-09-02 MED ORDER — MIDAZOLAM HCL 2 MG/2ML IJ SOLN
2.0000 mg | INTRAMUSCULAR | Status: AC
  Administered 2021-09-02: 2 mg via INTRAVENOUS

## 2021-09-02 MED ORDER — INSULIN ASPART 100 UNIT/ML IJ SOLN
0.0000 [IU] | INTRAMUSCULAR | Status: DC
  Administered 2021-09-02 (×2): 2 [IU] via SUBCUTANEOUS
  Administered 2021-09-02: 5 [IU] via SUBCUTANEOUS
  Administered 2021-09-03 (×4): 3 [IU] via SUBCUTANEOUS
  Administered 2021-09-03: 2 [IU] via SUBCUTANEOUS
  Administered 2021-09-03: 3 [IU] via SUBCUTANEOUS
  Administered 2021-09-03: 5 [IU] via SUBCUTANEOUS
  Administered 2021-09-04: 3 [IU] via SUBCUTANEOUS

## 2021-09-02 NOTE — Progress Notes (Signed)
Pharmacy Antibiotic Note ? ?[name] is a 81 y.o. male admitted on 09/02/2021 with pneumonia.  Pharmacy has been consulted to vancomycin dosing and broaden with cefepime. Patient now intubated and being transferred to ICU.  ? ?Patient with tmax of 99 overnight, wbc up to 18 Scr down below his previous baseline at 1.1 this morning. Will adjust vancomycin dosing. Cultures no growth so far.  ? ?Vancomycin 1250 mg IV Q 24 hrs. Goal AUC 400-550. ?Expected AUC: 462 ?SCr used: 1.15 ? ? ?Plan: ?Vancomycin 1250 IV every 24 hours.  Goal trough 15-20 mcg/mL. ?Cefepime 2g q12 hours ? ?Height: '5\' 8"'$  (172.7 cm) ?Weight: 85 kg (187 lb 6.3 oz) ?IBW/kg (Calculated) : 68.4 ? ?Temp (24hrs), Avg:98.9 ?F (37.2 ?C), Min:98.7 ?F (37.1 ?C), Max:99.2 ?F (37.3 ?C) ? ?Recent Labs  ?Lab 09/09/2021 ?0109 09/17/2021 ?0432 09/18/2021 ?0753 09/15/2021 ?4825 09/02/21 ?0037  ?WBC 14.0* 14.1*  --   --  18.8*  ?CREATININE 1.46* 1.47*  --   --  1.15  ?LATICACIDVEN  --   --  4.5* 3.3*  --   ? ?  ?Estimated Creatinine Clearance: 52.5 mL/min (by C-G formula based on SCr of 1.15 mg/dL).   ? ?Allergies  ?Allergen Reactions  ? Wilder Glade [Dapagliflozin] Other (See Comments)  ?  Frequent urination ?Difficulty breathing ?Rapid heartbeat  ? Ofev [Nintedanib] Diarrhea  ?  Severe diarrhea  ? ? ?Thank you for allowing pharmacy to be a part of this patient?s care. ? ?Erin Hearing PharmD., BCPS ?Clinical Pharmacist ?09/02/2021 7:39 AM ? ?

## 2021-09-02 NOTE — Progress Notes (Signed)
Notified by Dr Tamala Julian of urgent need for transfer to ICU ? ?Patient is alert but very restless ?138/82  ST 125  RR 35-40  O2 sat 83-90% on Bipap 12/8 100% Fio2 ? ?Transferred to 8I50 ?Plan for urgent intubation. ? ?Multiple family members at bedside,  family has all belongings. ?

## 2021-09-02 NOTE — Procedures (Signed)
Intubation Procedure Note ? ?Wynelle Fanny  ?[id number]  ?04/17/1941 ? ?Date:09/02/21  ?Time:9:38 AM  ? ?Provider Performing:Betzabe Bevans C Tamala Julian  ? ? ?Procedure: Intubation (31517) ? ?Indication(s) ?Respiratory Failure ? ?Consent ?Verbal ? ? ?Anesthesia ?Etomidate, Versed, Fentanyl, and Rocuronium ? ? ?Time Out ?Verified patient identification, verified procedure, site/side was marked, verified correct patient position, special equipment/implants available, medications/allergies/relevant history reviewed, required imaging and test results available. ? ? ?Sterile Technique ?Usual hand hygeine, masks, and gloves were used ? ? ?Procedure Description ?Patient positioned in bed supine.  Sedation given as noted above.  Patient was intubated with endotracheal tube using Glidescope.  View was Grade 1 full glottis .  Number of attempts was 1.  Colorimetric CO2 detector was consistent with tracheal placement. ? ? ?Complications/Tolerance ?None; patient tolerated the procedure well. ?Chest X-ray is ordered to verify placement. ? ? ?EBL ?Minimal ? ? ?Specimen(s) ?None ? ?

## 2021-09-02 NOTE — Procedures (Signed)
Central Venous Catheter Insertion Procedure Note ? ?Wynelle Fanny  ?[id number]  ?1940-05-12 ? ?Date:09/02/21  ?Time:9:41 AM  ? ?Provider Performing:Yajayra Feldt C Tamala Julian  ? ?Procedure: Insertion of Non-tunneled Central Venous Catheter(36556) with US guidance (34742)  ? ?Indication(s) ?Medication administration ? ?Consent ?Verbal ? ?Anesthesia ?Topical only with 1% lidocaine  ? ?Timeout ?Verified patient identification, verified procedure, site/side was marked, verified correct patient position, special equipment/implants available, medications/allergies/relevant history reviewed, required imaging and test results available. ? ?Sterile Technique ?Maximal sterile technique including full sterile barrier drape, hand hygiene, sterile gown, sterile gloves, mask, hair covering, sterile ultrasound probe cover (if used). ? ?Procedure Description ?Area of catheter insertion was cleaned with chlorhexidine and draped in sterile fashion.  With real-time ultrasound guidance a central venous catheter was placed into the left internal jugular vein. Nonpulsatile blood flow and easy flushing noted in all ports.  The catheter was sutured in place and sterile dressing applied. ? ?Complications/Tolerance ?None; patient tolerated the procedure well. ?Chest X-ray is ordered to verify placement for internal jugular or subclavian cannulation.   Chest x-ray is not ordered for femoral cannulation. ? ?EBL ?Minimal ? ?Specimen(s) ?None ? ?

## 2021-09-02 NOTE — Progress Notes (Signed)
Despite aggressive sedation still dys-synchronous.  Initiate NMB, consider proning if continued high O2 needs. ?

## 2021-09-02 NOTE — Progress Notes (Signed)
Dr. Cyd Silence notified of pts HR 140s, RR 35-40, 88% 02 sat despite being on HFNC & NRB ? ?0757  Pt placed on bipap ?0315 pt given 0.'5mg'$  ativan to help bipap compliance (which only increased restlessness) ? ?0400 MD updated about pts persistent increased WOB, HR 135, RR 35, 89% o2.    ABG, CXR ordered. ? ?MD at bedside shortly after to assess and speak with wife. Awaiting additional family to clarify code status.  40 Lasix given in the meantime. ?

## 2021-09-02 NOTE — Progress Notes (Addendum)
HOSPITAL MEDICINE OVERNIGHT EVENT NOTE   ? ?Notified by nursing that patient is continuing to exhibit significant tachypnea and work of breathing despite being on heated high flow and nonrebreather oxygen delivery with heated high flow currently at 30 L. ? ?Nursing notes the patient is still awake alert and oriented.  Patient is not exhibiting any change in his lung examination. ? ?Chart reviewed.  Critical care seems to have already seen the patient earlier in the day on 5/6 and at that time they recommended the patient be on BiPAP nightly.  I therefore recommended that the patient be transitioned from heated high flow to BiPAP simply for work of breathing.  Patient will be then be reassessed shortly after initiation of BiPAP and if patient continues to clinically deteriorate will notify PCCM. ? ?Vernelle Emerald  MD ?[facility]  ? ?ADDENDUM (5/7 5:45am) ? ?Notified by nursing that patient has continued to clinically deteriorate.  I promptly went to go evaluate the patient at the bedside.   ? ?EXAM:  ? ?Patient is breathing nearly 40 times a minute with heart rates ranging between 130 and 150 beats a minute and sinus tachycardia.  Patient is somewhat lethargic but arousable and oriented x3.  Patient is visibly in respiratory distress with evidence of accessory muscle use.  Lung exam reveals faint occasional expiratory wheezing with good air movement and fine bibasilar and mid field rales.  Abdomen is protuberant but soft.  No evidence of significant peripheral edema or JVD. ? ?LABS: ? ?Stat ABG and chest x-ray obtained  Chest x-ray personally reviewed revealing diffuse bilateral patchy infiltrates.  Evidence of substantial chronic pulmonary fibrosis with likely superimposed bilateral process. ? ?ABG revealing pH 7.28 with PaCO2 of 46 and PaO2 of 77.  Patient is likely suffering from both a metabolic and respiratory acidosis.  Very concerning that patient CO2 is still 46 despite breathing 40 times a minute  with excellent tidal volumes on BiPAP.  BNP obtained this morning is 197.5, up from 61.8 yesterday. ? ?A/P: ? ?Patient is deteriorating quickly due to his multifactorial process.  Day team has been treating patient with steroids, bronchodilator therapy and antibiotics.  Review of labs this morning reveals a rising BNP and improving creatinine therefore I will administer 40 mg of intravenous Lasix. ? ?Case discussed at length with family including the wife and daughter at the bedside along with the son via phone conversation.  I explained that if we proceeded with intubation for this patient it is very likely he will not be able to be extubated.  Despite this, they collectively have informed me that they are unsure as to whether to proceed with intubation or not and need more time to deliberate. ? ?Additionally I discussed the case with PCCM.  They have agreed with all management thus far and will come see the patient promptly.  Their assistance is appreciated. ? ?CRITICAL CARE ATTESTATION ? ?Patient is at significant risk of morbidity mortality due to rapidly progressive multifactorial hypoxic respiratory failure likely due to a combination of advanced pulmonary fibrosis with possible superimposed pneumonia, please possible superimposed pulmonary edema and possible ARDS.  Actively managing BiPAP therapy based on ABG results, provided patient with intravenous diuretics, steroids, antibiotics and bronchodilator therapy.  Coordinating with critical care subspecialists and discussing at length with patient and family.  Critical care time spent 71 minutes. ? ?Sherryll Burger Kandy Towery ? ? ? ? ? ? ? ? ? ? ?

## 2021-09-02 NOTE — Progress Notes (Signed)
Patient being intubated by critical care due to worsening resp distress.  Will sign off for now.  Please let us know when we can be of further assistance. ? ?Fayrene Helper, MD ?[facility] ?

## 2021-09-02 NOTE — Procedures (Signed)
Bronchoscopy Procedure Note ? ?Wynelle Fanny  ?[id number]  ?[birth year] ? ?Date:09/02/21  ?Time:9:40 AM  ? ?Provider Performing:Sherri Levenhagen C Tamala Julian  ? ?Procedure(s):  Flexible bronchoscopy with bronchial alveolar lavage (66294) ? ?Indication(s) ?Immunosuppressed ARDS ? ?Consent ?Verbal ? ?Anesthesia ?In place for intubation ? ? ?Time Out ?Verified patient identification, verified procedure, site/side was marked, verified correct patient position, special equipment/implants available, medications/allergies/relevant history reviewed, required imaging and test results available. ? ? ?Sterile Technique ?Usual hand hygiene, masks, gowns, and gloves were used ? ? ?Procedure Description ?Bronchoscope advanced through endotracheal tube and into airway.  Airways were examined down to subsegmental level with findings noted below.   ?Following diagnostic evaluation, BAL(s) performed in lingula with normal saline and return of mostly clear fluid ? ?Findings:  ?- ETT in good position ?- Minimal secretions ? ?Complications/Tolerance ?None; patient tolerated the procedure well. ?Chest X-ray is not needed post procedure. ? ? ?EBL ?Minimal ? ? ?Specimen(s) ?Lingula BAL ? ?

## 2021-09-02 NOTE — Progress Notes (Signed)
? ?  NAME:  [name], MRN:  [id number], DOB:  06-17-1940, LOS: 1 ?ADMISSION DATE:  09/13/2021, CONSULTATION DATE:  08/27/2021 ?REFERRING MD:  Florene Glen, CHIEF COMPLAINT:  SOB  ? ?History of Present Illness:  ?Very pleasant 81 year old man well known to our service with chronic hypoxemia due to RA associated ILD.  He was recently admitted from 4/14-4/18 with ILD exacerbation requiring prolonged steroid taper.  It seems once he finished this taper his DOE again got worse and now needed 6L at all times at home.   In ER required BIPAP through night and into this AM.  PCCM consulted to assist with further management. ? ?Pertinent  Medical History  ?CKD ?CTD-ILD with significant fibrosis ?GERD ?OSA on CPAP ?Prior esophageal strictures ?Prior R sided PTX ? ?Significant Hospital Events: ?Including procedures, antibiotic start and stop dates in addition to other pertinent events   ?5/5 admitted ?5/6 PCCM consult ? ?Interim History / Subjective:  ?Consulted, ROS as below ? ?Objective   ?Blood pressure (!) 158/82, pulse (!) 139, temperature 99 ?F (37.2 ?C), temperature source Axillary, resp. rate (!) 42, height '5\' 8"'$  (1.727 m), weight 85 kg, SpO2 90 %. ?   ?FiO2 (%):  [50 %-100 %] 100 %  ? ?Intake/Output Summary (Last 24 hours) at 09/02/2021 0706 ?Last data filed at 09/08/2021 2300 ?Gross per 24 hour  ?Intake 1071.34 ml  ?Output 950 ml  ?Net 121.34 ml  ? ?Filed Weights  ? 09/11/2021 0800 09/02/21 0541  ?Weight: 88 kg 85 kg  ? ? ?Examination: ?Respiratory distress on bipap ?Shallow rapid inspirations ?Sats 90% on 100% BIPAP ?Able to speak in 1-2 word sentences ?Ext without edema ? ?ABG acidemic ?BMP ok ? ?Resolved Hospital Problem list   ?N/A ? ?Assessment & Plan:  ?Acute on chronic hypoxemic respiratory failure- CTA with acute inflammatory changes on top of chronic RA- associated fibrotic ILD, no PE.  Progressive respiratory failure this AM. ?- ICU transfer, intubate, vent bundle, bronch ?- Diuretic challenge ?- Broaden abx:  vanc/cefepime/azithromycin ?- Hold imuran/ofev, continue steroids ? ?Best Practice (right click and "Reselect all SmartList Selections" daily)  ? ?Best practice:  ?Diet: NPO ?Pain/Anxiety/Delirium protocol (if indicated): PAD in place ?VAP protocol (if indicated): in place ?DVT prophylaxis: lovenox ?GI prophylaxis: PPI ?Glucose control: SSI ?Mobility: BR ?Code Status: full (confirmed at time of consult yesterday with patient and family) ?Family Communication: updated at bedside ?Disposition: ICU ? ?45 min cc time independent of procedures ?Erskine Emery MD PCCM ? ? ? ?

## 2021-09-03 ENCOUNTER — Inpatient Hospital Stay (HOSPITAL_COMMUNITY): Payer: Medicare Other

## 2021-09-03 DIAGNOSIS — I5032 Chronic diastolic (congestive) heart failure: Secondary | ICD-10-CM | POA: Diagnosis not present

## 2021-09-03 DIAGNOSIS — Z66 Do not resuscitate: Secondary | ICD-10-CM

## 2021-09-03 DIAGNOSIS — J9621 Acute and chronic respiratory failure with hypoxia: Secondary | ICD-10-CM | POA: Diagnosis not present

## 2021-09-03 DIAGNOSIS — M069 Rheumatoid arthritis, unspecified: Secondary | ICD-10-CM | POA: Diagnosis not present

## 2021-09-03 DIAGNOSIS — J849 Interstitial pulmonary disease, unspecified: Secondary | ICD-10-CM

## 2021-09-03 DIAGNOSIS — Z515 Encounter for palliative care: Secondary | ICD-10-CM

## 2021-09-03 LAB — POCT I-STAT 7, (LYTES, BLD GAS, ICA,H+H)
Acid-base deficit: 1 mmol/L (ref 0.0–2.0)
Bicarbonate: 30.5 mmol/L — ABNORMAL HIGH (ref 20.0–28.0)
Calcium, Ion: 1.12 mmol/L — ABNORMAL LOW (ref 1.15–1.40)
HCT: 33 % — ABNORMAL LOW (ref 39.0–52.0)
Hemoglobin: 11.2 g/dL — ABNORMAL LOW (ref 13.0–17.0)
O2 Saturation: 92 %
Patient temperature: 98.6
Potassium: 5.7 mmol/L — ABNORMAL HIGH (ref 3.5–5.1)
Sodium: 137 mmol/L (ref 135–145)
TCO2: 33 mmol/L — ABNORMAL HIGH (ref 22–32)
pCO2 arterial: 94.6 mmHg (ref 32–48)
pH, Arterial: 7.117 — CL (ref 7.35–7.45)
pO2, Arterial: 90 mmHg (ref 83–108)

## 2021-09-03 LAB — BASIC METABOLIC PANEL
Anion gap: 11 (ref 5–15)
Anion gap: 9 (ref 5–15)
BUN: 40 mg/dL — ABNORMAL HIGH (ref 8–23)
BUN: 47 mg/dL — ABNORMAL HIGH (ref 8–23)
CO2: 28 mmol/L (ref 22–32)
CO2: 27 mmol/L (ref 22–32)
Calcium: 8.2 mg/dL — ABNORMAL LOW (ref 8.9–10.3)
Calcium: 7.8 mg/dL — ABNORMAL LOW (ref 8.9–10.3)
Chloride: 101 mmol/L (ref 98–111)
Chloride: 103 mmol/L (ref 98–111)
Creatinine, Ser: 2.78 mg/dL — ABNORMAL HIGH (ref 0.61–1.24)
Creatinine, Ser: 4.02 mg/dL — ABNORMAL HIGH (ref 0.61–1.24)
GFR, Estimated: 22 mL/min — ABNORMAL LOW (ref >60)
GFR, Estimated: 14 mL/min — ABNORMAL LOW (ref >60)
Glucose, Bld: 208 mg/dL — ABNORMAL HIGH (ref 70–99)
Glucose, Bld: 183 mg/dL — ABNORMAL HIGH (ref 70–99)
Potassium: 5.8 mmol/L — ABNORMAL HIGH (ref 3.5–5.1)
Potassium: 6.1 mmol/L — ABNORMAL HIGH (ref 3.5–5.1)
Sodium: 140 mmol/L (ref 135–145)
Sodium: 139 mmol/L (ref 135–145)

## 2021-09-03 LAB — CBC
HCT: 33.8 % — ABNORMAL LOW (ref 39.0–52.0)
Hemoglobin: 10.4 g/dL — ABNORMAL LOW (ref 13.0–17.0)
MCH: 30.6 pg (ref 26.0–34.0)
MCHC: 30.8 g/dL (ref 30.0–36.0)
MCV: 99.4 fL (ref 80.0–100.0)
Platelets: 189 10*3/uL (ref 150–400)
RBC: 3.4 MIL/uL — ABNORMAL LOW (ref 4.22–5.81)
RDW: 16.6 % — ABNORMAL HIGH (ref 11.5–15.5)
WBC: 21.2 10*3/uL — ABNORMAL HIGH (ref 4.0–10.5)
nRBC: 0 % (ref 0.0–0.2)

## 2021-09-03 LAB — TRIGLYCERIDES: Triglycerides: 195 mg/dL — ABNORMAL HIGH (ref <150)

## 2021-09-03 LAB — POTASSIUM: Potassium: 5.9 mmol/L — ABNORMAL HIGH (ref 3.5–5.1)

## 2021-09-03 LAB — GLUCOSE, CAPILLARY
Glucose-Capillary: 196 mg/dL — ABNORMAL HIGH (ref 70–99)
Glucose-Capillary: 174 mg/dL — ABNORMAL HIGH (ref 70–99)
Glucose-Capillary: 161 mg/dL — ABNORMAL HIGH (ref 70–99)
Glucose-Capillary: 164 mg/dL — ABNORMAL HIGH (ref 70–99)
Glucose-Capillary: 138 mg/dL — ABNORMAL HIGH (ref 70–99)
Glucose-Capillary: 161 mg/dL — ABNORMAL HIGH (ref 70–99)

## 2021-09-03 LAB — EXPECTORATED SPUTUM ASSESSMENT W GRAM STAIN, RFLX TO RESP C

## 2021-09-03 LAB — MAGNESIUM: Magnesium: 2.5 mg/dL — ABNORMAL HIGH (ref 1.7–2.4)

## 2021-09-03 LAB — VANCOMYCIN, RANDOM: Vancomycin Rm: 11

## 2021-09-03 LAB — BRAIN NATRIURETIC PEPTIDE: B Natriuretic Peptide: 265.1 pg/mL — ABNORMAL HIGH (ref 0.0–100.0)

## 2021-09-03 LAB — SEDIMENTATION RATE: Sed Rate: 106 mm/hr — ABNORMAL HIGH (ref 0–16)

## 2021-09-03 MED ORDER — SODIUM ZIRCONIUM CYCLOSILICATE 10 G PO PACK
10.0000 g | PACK | Freq: Two times a day (BID) | ORAL | Status: DC
  Administered 2021-09-03: 10 g
  Filled 2021-09-03: qty 1

## 2021-09-03 MED ORDER — DEXTROSE 50 % IV SOLN
25.0000 g | Freq: Once | INTRAVENOUS | Status: AC
  Administered 2021-09-03: 25 g via INTRAVENOUS
  Filled 2021-09-03: qty 50

## 2021-09-03 MED ORDER — SODIUM ZIRCONIUM CYCLOSILICATE 10 G PO PACK
10.0000 g | PACK | Freq: Three times a day (TID) | ORAL | Status: DC
  Administered 2021-09-03 (×2): 10 g
  Filled 2021-09-03 (×2): qty 1

## 2021-09-03 MED ORDER — INSULIN ASPART 100 UNIT/ML IV SOLN
5.0000 [IU] | Freq: Once | INTRAVENOUS | Status: AC
  Administered 2021-09-03: 5 [IU] via INTRAVENOUS

## 2021-09-03 MED ORDER — VANCOMYCIN HCL 1250 MG/250ML IV SOLN
1250.0000 mg | INTRAVENOUS | Status: DC
  Administered 2021-09-03: 1250 mg via INTRAVENOUS
  Filled 2021-09-03 (×2): qty 250

## 2021-09-03 MED ORDER — CALCIUM GLUCONATE-NACL 1-0.675 GM/50ML-% IV SOLN
1.0000 g | Freq: Once | INTRAVENOUS | Status: AC
  Administered 2021-09-03: 1000 mg via INTRAVENOUS
  Filled 2021-09-03: qty 50

## 2021-09-03 MED ORDER — DEXTROSE 50 % IV SOLN
1.0000 | Freq: Once | INTRAVENOUS | Status: AC
  Administered 2021-09-03: 50 mL via INTRAVENOUS
  Filled 2021-09-03: qty 50

## 2021-09-03 MED ORDER — SODIUM ZIRCONIUM CYCLOSILICATE 10 G PO PACK: 10.0000 g | PACK | Freq: Once | ORAL | Status: DC

## 2021-09-03 MED ORDER — ENOXAPARIN SODIUM 30 MG/0.3ML IJ SOSY: 30.0000 mg | PREFILLED_SYRINGE | Freq: Every day | INTRAMUSCULAR | Status: DC

## 2021-09-03 MED ORDER — STERILE WATER FOR INJECTION IV SOLN
INTRAMUSCULAR | Status: DC
  Filled 2021-09-03: qty 150
  Filled 2021-09-03 (×2): qty 1000

## 2021-09-03 MED ORDER — SODIUM CHLORIDE 0.9 % IV SOLN: 2.0000 g | INTRAVENOUS | Status: DC

## 2021-09-03 MED ORDER — VASOPRESSIN 20 UNITS/100 ML INFUSION FOR SHOCK
0.0000 [IU]/min | INTRAVENOUS | Status: DC
  Administered 2021-09-03 – 2021-09-04 (×3): 0.03 [IU]/min via INTRAVENOUS
  Filled 2021-09-03 (×3): qty 100

## 2021-09-03 MED ORDER — INSULIN ASPART 100 UNIT/ML IV SOLN
10.0000 [IU] | Freq: Once | INTRAVENOUS | Status: AC
  Administered 2021-09-03: 10 [IU] via INTRAVENOUS

## 2021-09-03 MED ORDER — VITAL HIGH PROTEIN PO LIQD
1000.0000 mL | ORAL | Status: DC
  Administered 2021-09-03: 1000 mL

## 2021-09-03 NOTE — Progress Notes (Signed)
Pharmacy Antibiotic Note ? ?[name] is a 81 y.o. male admitted on 09/25/2021 with pneumonia.  Pharmacy has been consulted to vancomycin dosing and broaden with cefepime. Patient now intubated and being transferred to ICU.  ? ?Vancomycin 1250 mg IV Q 24 hrs. Goal AUC 400-550. ?Expected AUC: 462 ?SCr used: 1.15 ? ?Pt with increasing Scr today up to 2.78.  Checked vancomycin random level to assess clearance = 11. ? ? ?Plan: ?Continue vancomycin 1250 IV every 24 hours for now.  Goal trough 15-20 mcg/mL. ?Change cefepime to 2g IV q 24 hrs for reduced  renal clearance. ? ?Height: '5\' 8"'$  (172.7 cm) ?Weight: 85 kg (187 lb 6.3 oz) ?IBW/kg (Calculated) : 68.4 ? ?Temp (24hrs), Avg:98.8 ?F (37.1 ?C), Min:96.3 ?F (35.7 ?C), Max:100.8 ?F (38.2 ?C) ? ?Recent Labs  ?Lab 09/16/2021 ?0109 09/13/2021 ?0432 08/31/2021 ?0753 09/03/2021 ?6270 09/02/21 ?3500 09/02/21 ?1858 09/03/21 ?0413 09/03/21 ?1226  ?WBC 14.0* 14.1*  --   --  18.8*  --  21.2*  --   ?CREATININE 1.46* 1.47*  --   --  1.15  --  2.78*  --   ?LATICACIDVEN  --   --  4.5* 3.3*  --  1.4  --   --   ?VANCORANDOM  --   --   --   --   --   --   --  11  ? ?  ?Estimated Creatinine Clearance: 21.7 mL/min (A) (by C-G formula based on SCr of 2.78 mg/dL (H)).   ? ?Allergies  ?Allergen Reactions  ? Wilder Glade [Dapagliflozin] Other (See Comments)  ?  Frequent urination ?Difficulty breathing ?Rapid heartbeat  ? Ofev [Nintedanib] Diarrhea  ?  Severe diarrhea  ? ?Vanc 5/6>> ?Cefepime 5/7> (5/11) ?Ctx 5/6 ?Azith 5/6> ? ?5/8 - with AKI - checking VR = 11 - con't 1250 q 24 ? ?5/6 BCx x 2 >  ?5/7 Resp Cx >  ? ? ?Thank you for allowing pharmacy to be a part of this patient?s care. ? ?[place] Crane, Pharm D, BCPS, BCCP ?Clinical Pharmacist ? 09/03/2021 2:08 PM  ? ?[facility] pharmacy phone numbers are listed on [email or web address] ? ? ?

## 2021-09-03 NOTE — IPAL (Signed)
?  Interdisciplinary Goals of Care Family Meeting ? ? ?Date carried out: 09/03/2021 ? ?Location of the meeting: Bedside ? ?Member's involved: Physician, Bedside Registered Nurse, Family Member or next of kin, and Palliative care team member ? ?Durable Power of Tour manager: Wife [name] ? ?Discussion: We discussed goals of care for [facility] .  Discussed nature of multiorgan failure with [name]'s heart, kidneys now starting to fail despite antibiotics, mechanical ventilation, escalating pressors, steroids.  She understands and thinks its in god's hands.  She would prefer current level of care but allow natural death should his heart stop.  Appreciate palliative care help. ? ?Code status: Full DNR ? ?Disposition: Continue current acute care ? ?Time spent for the meeting: 10 mins ? ? ? ?Candee Furbish, MD ? ?09/03/2021, 10:35 AM ? ? ?

## 2021-09-03 NOTE — Telephone Encounter (Signed)
Per most recent OV note, patient became "deathly sick" from 3 days of Ofev. He is not open to retrying at lower dose. ? ?Knox Saliva, PharmD, MPH, BCPS, CPP ?Clinical Pharmacist (Rheumatology and Pulmonology) ?

## 2021-09-03 NOTE — Progress Notes (Signed)
This chaplain responded to PMT consult for the Pt. wife request for support and prayer.  The Pt. was participating in Pt. care at the time of the visit.  ? ?The chaplain met the Pt. family in the waiting area. The Pt. daughter-Jamilah (preferred name Keisha) was the spokesperson for the group (sister & brother in law, grandchildren, cousin). The chaplain understands The Pt. wife-[name] stepped away from the group and Keisha's husband is picking up the Pt. son.   ? ?The chaplain educated and offered the family an extra layer of spiritual care outside of their faith community and family as needed. ? ?Chaplain   ?[phone] ?

## 2021-09-03 NOTE — Progress Notes (Signed)
? ?  NAME:  [name], MRN:  [id number], DOB:  10-04-1940, LOS: 2 ?ADMISSION DATE:  08/28/2021, CONSULTATION DATE:  08/29/2021 ?REFERRING MD:  Florene Glen, CHIEF COMPLAINT:  SOB  ? ?History of Present Illness:  ?Very pleasant 81 year old man well known to our service with chronic hypoxemia due to RA associated ILD.  He was recently admitted from 4/14-4/18 with ILD exacerbation requiring prolonged steroid taper.  It seems once he finished this taper his DOE again got worse and now needed 6L at all times at home.   In ER required BIPAP through night and into this AM.  PCCM consulted to assist with further management. ? ?Pertinent  Medical History  ?CKD ?CTD-ILD with significant fibrosis ?GERD ?OSA on CPAP ?Prior esophageal strictures ?Prior R sided PTX ? ?Significant Hospital Events: ?Including procedures, antibiotic start and stop dates in addition to other pertinent events   ?5/5 admitted ?5/6 PCCM consult ?5/7 intubation, NMB ? ?Interim History / Subjective:  ?Tough to ventilate ?Renal function worse ? ?Objective   ?Blood pressure (!) 105/51, pulse (!) 101, temperature 97.7 ?F (36.5 ?C), resp. rate (!) 30, height '5\' 8"'$  (1.727 m), weight 85 kg, SpO2 (!) 86 %. ?CVP:  [9 mmHg] 9 mmHg  ?Vent Mode: PRVC ?FiO2 (%):  [40 %-100 %] 60 % ?Set Rate:  [30 bmp] 30 bmp ?Vt Set:  [540 mL] 540 mL ?PEEP:  [8 cmH20-10 cmH20] 10 cmH20 ?Plateau Pressure:  [27 cmH20-30 cmH20] 30 cmH20  ? ?Intake/Output Summary (Last 24 hours) at 09/03/2021 0802 ?Last data filed at 09/03/2021 0600 ?Gross per 24 hour  ?Intake 1742.27 ml  ?Output 1865 ml  ?Net -122.73 ml  ? ? ?Filed Weights  ? 09/18/2021 0800 09/02/21 0541  ?Weight: 88 kg 85 kg  ? ? ?Examination: ?Sedated and paralyzed on vent ?BIS in 30s ?Severely diminished breath sounds ?Driving pressure 23 on vent (PEEP 10, Plat 33) ?Ext without edema ?On pressors ?Foley dark output ? ?ABG remains acidemic ?CXR continued severe bilateral airspace disease ? ?Resolved Hospital Problem list   ?N/A ? ?Assessment & Plan:   ?Acute on chronic hypoxemic respiratory failure- CTA with acute inflammatory changes on top of chronic RA- associated fibrotic ILD, no PE.  Progressive respiratory failure this AM. ?Acute renal injury with hyperkalemia- after diuretic challenge and hypotension ?- vanc/cefepime/azithromycin ?- DC diuretics, start bicarb drip to try to salvage kidneys and reduce acidemia, no room to increase ventilation ?- Hold imuran/ofev, continue steroids ?- Palliative consult, will have family come in and discuss EOL ? ?Best Practice (right click and "Reselect all SmartList Selections" daily)  ? ?Best practice:  ?Diet: Start trickle ?Pain/Anxiety/Delirium protocol (if indicated): PAD in place ?VAP protocol (if indicated): in place ?DVT prophylaxis: lovenox ?GI prophylaxis: PPI ?Glucose control: SSI ?Mobility: BR ?Code Status: full (confirmed at time of consult yesterday with patient and family) ?Family Communication: updated at bedside ?Disposition: ICU ? ?35 min cc time independent of procedures ?Erskine Emery MD PCCM ? ? ? ?

## 2021-09-03 NOTE — Progress Notes (Signed)
eLink Physician-Brief Progress Note ?Patient Name: [name] ?DOB: 06-Jun-1940 ?MRN: [id number] ? ? ?Date of Service ? 09/03/2021  ?HPI/Events of Note ? AM labs with K+ 5.8, pt is scheduled for lasix this Am but with little UO tonight,    AM  ABG's in epic- On Vent 60/10/30/540-   On Nimbex with sedation and levo. ? ?Discussed with RN. ?  ?eICU Interventions ? - decreas fio2 from 60 % to 40% to keep sats on monitor between 88 to 94% only, follow ABG and K level at 7.30 AM. ?D50% and 5 units IV insuline stat ordered for K level 5.8, and give AM lasix IV now.   ? ? ? ?Intervention Category ?Intermediate Interventions: Electrolyte abnormality - evaluation and management ? ?Elmer Sow ?09/03/2021, 5:53 AM ?

## 2021-09-03 NOTE — Consult Note (Signed)
? ?                                                                                ?Consultation Note ?Date: 09/03/2021  ? ?Patient Name: [name]  ?DOB: 08/24/1940  MRN: [id number]  Age / Sex: 81 y.o., male  ?PCP: [name], MD ?Referring Physician: Candee Furbish, MD ? ?Reason for Consultation: Establishing goals of care ? ?HPI/Patient Profile: 81 y.o. male  with past medical history of chronic respiratory failure, ILD, rheumatoid arthritis, HTN, chronic diastolic heart failure, and recent hospitalization for respiratory failure (4/14 - 4/18) admitted on 08/31/2021 with chronic hypoxic respiratory failure in setting of acute exacerbation of interstitial lung disease. ? ?Patient was intubated on 5/7.  He remains on max pressors and full mechanical ventilation support.  ? ?[name] of medicine team was consulted to discuss goals of care. ?Clinical Assessment and Goals of Care: ?I have reviewed medical records including EPIC notes, labs and imaging, assessed the patient and then met with Dr. Tamala Julian and wife [name] at bedside to discuss diagnosis prognosis, GOC, EOL wishes, disposition and options. ? ?I introduced Palliative Medicine as specialized medical care for people living with serious illness. It focuses on providing relief from the symptoms and stress of a serious illness. The goal is to improve quality of life for both the patient and the family. ? ?We discussed a brief life review of the patient.  Patient and wife have been married for 61 years.  They were high school sweetheart's.  Patient worked as a Radio broadcast assistant.  Wife describes him as a man that everyone loved.  She relies heavily on her faith and speaks often of her church. ? ?We discussed patient's current illness and what it means in the larger context of patient's on-going co-morbidities.  Natural disease trajectory and expectations at EOL were discussed.  Dr. Tamala Julian outlined that despite all appropriate and available medical interventions being given  patient is still at high risk for hospital death.  ? ?I attempted to elicit values and goals of care important to the patient.  [name] shares that her husband is a man of God and that she listens to the [facility] to help guide her.  She says he is in God's hands now.  She also highlights that she does not want him to be in pain and to allow God's will. ? ?The difference between aggressive medical intervention and comfort care was considered in light of the patient's goals of care.  [name] wants to continue current treatments because she is waiting for her son to fly in from [place]. ? ?Therapeutic silence and active listening offered for [name] to share her thoughts and emotions regarding her husband's current health status.  She was appropriately tearful but has a realistic understanding that patient has reached end-of-life. ? ?Education offered regarding concept specific to human mortality and the limitations of medical interventions to prolong life when the body begins to fail to thrive. ?  ?Discussed with [name] the importance of continued conversation with family and the medical providers regarding overall plan of care and treatment options, ensuring decisions are within the context of the patient?s values and GOCs.   ? ?  Questions and concerns were addressed. Agreed with Dr. Tamala Julian to reconvene with [name] and patient's son when he arrives today, 5/8. ? ?Primary Decision Maker ?NEXT OF KIN ? ?Code Status/Advance Care Planning: ?DNR ? ?Prognosis:   ?Hours - Days ? ?Discharge Planning: Anticipated Hospital Death ? ?Primary Diagnoses: ?Present on Admission: ? Acute on chronic respiratory failure with hypoxia ([place]) ? GERD ? Rheumatoid arthritis ([place]) ? Essential hypertension ? Chronic diastolic CHF (congestive heart failure) ([place]) ? CKD stage G3b/A1, GFR 30-44 and albumin creatinine ratio <30 mg/g ([facility]) ? ? ?Physical Exam ?Vitals reviewed.  ?Constitutional:   ?   General: He is not in acute distress. ?   Appearance: He  is ill-appearing.  ?HENT:  ?   Head: Normocephalic and atraumatic.  ?   Mouth/Throat:  ?   Mouth: Mucous membranes are moist.  ?Cardiovascular:  ?   Rate and Rhythm: Normal rate.  ?   Pulses: Normal pulses.  ?Pulmonary:  ?   Comments: MV ?Musculoskeletal:  ?   Comments: Sedated and paralyzed  ?Skin: ?   General: Skin is warm.  ?   Coloration: Skin is pale.  ? ? ?Palliative Assessment/Data: 10% ? ? ? ? ?I discussed this patient's plan of care with Dr. Tamala Julian, nursing, patient's wife [name], Dr. Candie Chroman. ? ?Thank you for this consult. Palliative medicine will continue to follow and assist holistically.  ? ?Time Total: 75 minutes ?Greater than 50%  of this time was spent counseling and coordinating care related to the above assessment and plan. ? ?Signed by: ?Jordan Hawks, DNP, FNP-BC ?Palliative Medicine ? ?  ?Please contact Palliative Medicine Team phone at [phone] for questions and concerns.  ?For individual provider: See Amion ? ? ? ? ? ? ? ? ? ? ? ? ?  ?

## 2021-09-03 NOTE — Progress Notes (Signed)
Dr Tamala Julian made aware of inability to capture twitches with TOF. MD stated to pause Nimbex per protocol and restart as needed.  ?

## 2021-09-03 NOTE — Progress Notes (Signed)
Met with daughter who states she has POA paperwork.  Went over patient's terminal decline.  She is upset that [name] did not tell her how poorly he was doing.  She will get more family together so we can do a combined update. Agrees that we should continue aggressive care but allow natural death should heart stop. ? ?Erskine Emery MD PCCM ?

## 2021-09-03 NOTE — Progress Notes (Signed)
Continued renal decline, K rising. ?Attempting to treat with lokelma standing and another round of insulin/dextrose. ? ?

## 2021-09-04 DIAGNOSIS — J9621 Acute and chronic respiratory failure with hypoxia: Secondary | ICD-10-CM | POA: Diagnosis not present

## 2021-09-04 LAB — MAGNESIUM: Magnesium: 2.5 mg/dL — ABNORMAL HIGH (ref 1.7–2.4)

## 2021-09-04 LAB — CBC
HCT: 31.2 % — ABNORMAL LOW (ref 39.0–52.0)
Hemoglobin: 9.8 g/dL — ABNORMAL LOW (ref 13.0–17.0)
MCH: 31.2 pg (ref 26.0–34.0)
MCHC: 31.4 g/dL (ref 30.0–36.0)
MCV: 99.4 fL (ref 80.0–100.0)
Platelets: 170 10*3/uL (ref 150–400)
RBC: 3.14 MIL/uL — ABNORMAL LOW (ref 4.22–5.81)
RDW: 16.8 % — ABNORMAL HIGH (ref 11.5–15.5)
WBC: 16.7 10*3/uL — ABNORMAL HIGH (ref 4.0–10.5)
nRBC: 0.2 % (ref 0.0–0.2)

## 2021-09-04 LAB — LEGIONELLA PNEUMOPHILA SEROGP 1 UR AG: L. pneumophila Serogp 1 Ur Ag: NEGATIVE

## 2021-09-04 LAB — BASIC METABOLIC PANEL
Anion gap: 14 (ref 5–15)
BUN: 57 mg/dL — ABNORMAL HIGH (ref 8–23)
CO2: 28 mmol/L (ref 22–32)
Calcium: 7.5 mg/dL — ABNORMAL LOW (ref 8.9–10.3)
Chloride: 95 mmol/L — ABNORMAL LOW (ref 98–111)
Creatinine, Ser: 5.58 mg/dL — ABNORMAL HIGH (ref 0.61–1.24)
GFR, Estimated: 10 mL/min — ABNORMAL LOW (ref >60)
Glucose, Bld: 205 mg/dL — ABNORMAL HIGH (ref 70–99)
Potassium: 5.7 mmol/L — ABNORMAL HIGH (ref 3.5–5.1)
Sodium: 137 mmol/L (ref 135–145)

## 2021-09-04 LAB — PNEUMOCYSTIS JIROVECI SMEAR BY DFA

## 2021-09-04 LAB — CULTURE, RESPIRATORY W GRAM STAIN
Culture: NORMAL
Gram Stain: NONE SEEN

## 2021-09-04 LAB — GLUCOSE, CAPILLARY: Glucose-Capillary: 186 mg/dL — ABNORMAL HIGH (ref 70–99)

## 2021-09-04 LAB — SEDIMENTATION RATE: Sed Rate: 94 mm/hr — ABNORMAL HIGH (ref 0–16)

## 2021-09-04 MED ORDER — SODIUM POLYSTYRENE SULFONATE 15 GM/60ML PO SUSP
15.0000 g | Freq: Once | ORAL | Status: AC
  Administered 2021-09-04: 15 g
  Filled 2021-09-04: qty 60

## 2021-09-04 MED ORDER — ALBUTEROL SULFATE (2.5 MG/3ML) 0.083% IN NEBU: 10.0000 mg | INHALATION_SOLUTION | Freq: Once | RESPIRATORY_TRACT | Status: DC

## 2021-09-04 MED ORDER — SODIUM ZIRCONIUM CYCLOSILICATE 10 G PO PACK: 10.0000 g | PACK | Freq: Two times a day (BID) | ORAL | Status: DC

## 2021-09-04 MED ORDER — CHLORHEXIDINE GLUCONATE 0.12 % MT SOLN
OROMUCOSAL | Status: AC
  Filled 2021-09-04: qty 15

## 2021-09-06 LAB — CULTURE, BLOOD (ROUTINE X 2)
Culture: NO GROWTH
Culture: NO GROWTH

## 2021-09-25 ENCOUNTER — Ambulatory Visit: Payer: Medicare Other | Admitting: Internal Medicine

## 2021-09-27 IMAGING — DX DG CHEST 1V PORT
1 series · 1 of 1 positions shown · non-contrast
Comparison: Earlier today

CLINICAL DATA: Chest tube repositioning

EXAM:
PORTABLE CHEST 1 VIEW

[chest ap]
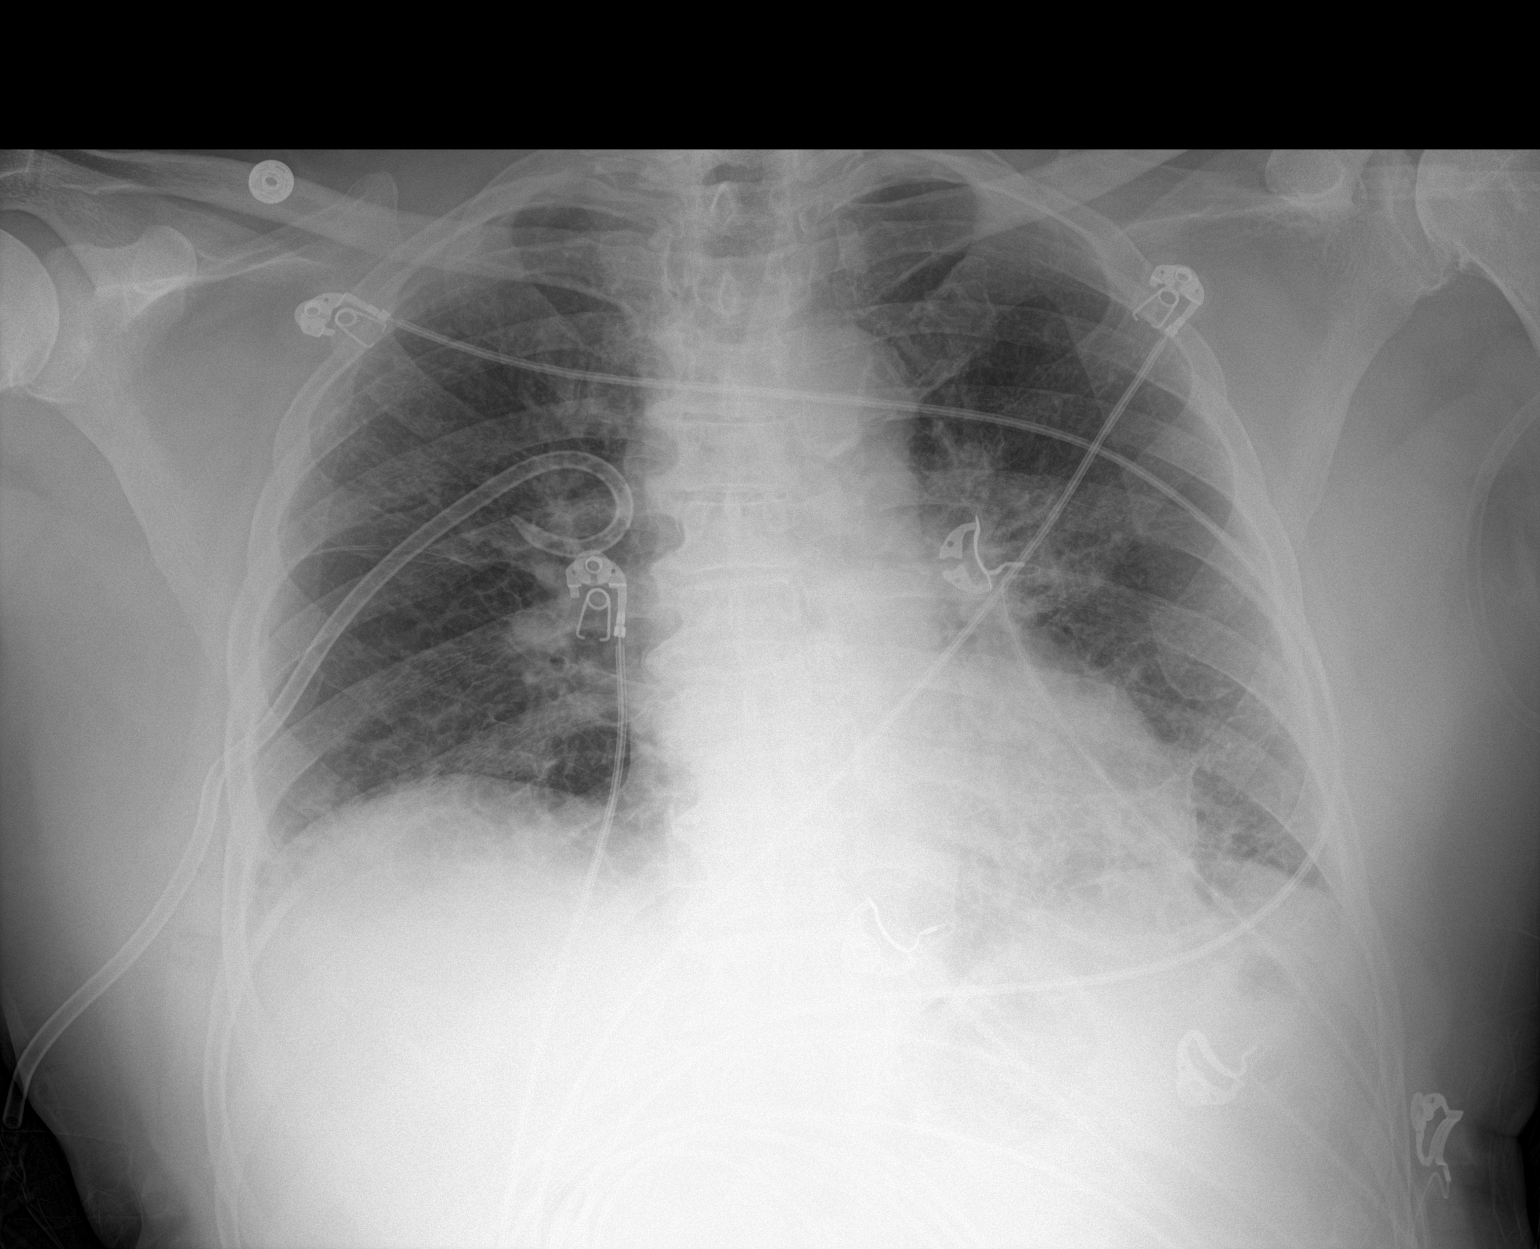

[1 of 1 positions shown; findings below may reference images not displayed]

FINDINGS: Retracted chest tube with retention loop now over the mid chest.
Right pneumothorax is significantly decreased, now trace.

Patchy bilateral pulmonary infiltrate.

Normal heart size.
IMPRESSION: Nearly resolved right pneumothorax after chest tube manipulation.

## 2021-09-27 NOTE — Progress Notes (Addendum)
? ?  NAME:  [name], MRN:  [id number], DOB:  [birth year], LOS: 3 ?ADMISSION DATE:  09/07/2021, CONSULTATION DATE:  09/03/2021 ?REFERRING MD:  Florene Glen, CHIEF COMPLAINT:  SOB  ? ?History of Present Illness:  ?Very pleasant 81 year old man well known to our service with chronic hypoxemia due to RA associated ILD.  He was recently admitted from 4/14-4/18 with ILD exacerbation requiring prolonged steroid taper.  It seems once he finished this taper his DOE again got worse and now needed 6L at all times at home.   In ER required BIPAP through night and into this AM.  PCCM consulted to assist with further management. ? ?Pertinent  Medical History  ?CKD ?CTD-ILD with significant fibrosis ?GERD ?OSA on CPAP ?Prior esophageal strictures ?Prior R sided PTX ? ?Significant Hospital Events: ?Including procedures, antibiotic start and stop dates in addition to other pertinent events   ?5/5 admitted ?5/6 PCCM consult ?5/7 intubation, NMB ? ?Interim History / Subjective:  ?Continues to be anuric with high pressor needs.  Wife and son at bedside. ? ?Objective   ?Blood pressure (!) 77/48, pulse 97, temperature (!) 100.6 ?F (38.1 ?C), resp. rate (!) 27, height '5\' 8"'$  (1.727 m), weight 89.3 kg, SpO2 (!) 87 %. ?   ?Vent Mode: PRVC ?FiO2 (%):  [50 %-60 %] 50 % ?Set Rate:  [30 bmp] 30 bmp ?Vt Set:  [540 mL] 540 mL ?PEEP:  [10 cmH20] 10 cmH20 ?Plateau Pressure:  [27 cmH20-31 cmH20] 27 cmH20  ? ?Intake/Output Summary (Last 24 hours) at Sep 16, 2021 0954 ?Last data filed at 2021-09-16 0800 ?Gross per 24 hour  ?Intake 5391.69 ml  ?Output 60 ml  ?Net 5331.69 ml  ? ? ?Filed Weights  ? 08/31/2021 0800 09/02/21 0541 09-16-2021 0500  ?Weight: 88 kg 85 kg 89.3 kg  ? ? ?Examination: ?Sedated and paralyzed on vent ?BIS in 30s ?Severely diminished breath sounds ?Ext without edema ?On pressors ?Foley no output ? ?K remains high ?Cr continues to rise ?WBC improved ?No new imaging ? ?Resolved Hospital Problem list   ?N/A ? ?Assessment & Plan:  ?Acute on chronic hypoxemic  respiratory failure- CTA with acute inflammatory changes on top of chronic RA- associated fibrotic ILD, no PE.  Progressive respiratory failure this AM. ?Acute renal injury with hyperkalemia- after diuretic challenge and hypotension; not HD candidate ?- vanc/cefepime/azithromycin ?- vent support ? ?Long talk again with family today, plan is to transition to comfort care once daughter arrives. DC unnecessary lab draws and meds in interim. ? ?- No further lab checks ?- DC fingersticks ?- DC bicarb drip ?- Wean off nimbex ? ?DNR, in hospital death expected ? ?Best Practice (right click and "Reselect all SmartList Selections" daily)  ? ?Best practice:  ?Diet: Start trickle ?Pain/Anxiety/Delirium protocol (if indicated): PAD in place ?VAP protocol (if indicated): in place ?DVT prophylaxis: DC ?GI prophylaxis: PPI ?Glucose control: DC ?Mobility: BR ?Code Status: full (confirmed at time of consult yesterday with patient and family) ?Family Communication: updated at bedside ?Disposition: ICU ? ?33 min cc time independent of procedures ?Erskine Emery MD PCCM ? ? ? ?

## 2021-09-27 NOTE — Progress Notes (Signed)
During rounds MD spoke with family and decision made for no escalation of ventilator or pressors at this time. When daughter arrives full comfort measures will be placed.  ?

## 2021-09-27 NOTE — Procedures (Signed)
Extubation Procedure Note ? ?Patient Details:   ?Name: [name] ?DOB: 1941/03/18 ?MRN: [id number] ?  ?Airway Documentation:  ?  ?Vent end date: 09/17/21 Vent end time: 1317  ? ?Evaluation ? O2 sats: stable throughout ?Complications: No apparent complications ?Patient did tolerate procedure well. ?Bilateral Breath Sounds: Clear, Diminished ?  ?No ? ?Patient was extubated to RA per comfort care order. RN and wife at the bedside with RT during extubation.  ? ?[name] ?Sep 17, 2021, 1:22 PM ? ?

## 2021-09-27 NOTE — Progress Notes (Addendum)
Nutrition Brief Note ? ?Chart reviewed.Pt on vent with trickle TF order in place by MD.  ?Pt now transitioning to comfort care.  ?No further nutrition interventions planned at this time.  ?Please re-consult as needed.  ? ?Kerman Passey MS, RDN, LDN, CNSC ?Registered Dietitian III ?Clinical Nutrition ?RD Pager and On-Call Pager Number Located in [place]  ? ? ?

## 2021-09-27 NOTE — Progress Notes (Addendum)
This chaplain responded to the unit page and consult from Dr. Tamala Julian for EOL spiritual care and prayer.  The chaplain joined the Pt. RN-Olivia, Pt. son-Shannon, and Pt. wife-[name].   ? ?The chaplain listened reflectively as [name] participated in story telling. The chaplain understands God shares his love abundantly with the Pt. and family. [name] speaks confidently of Jlyn's desire to be free from life prolonging measures. ? ?The chaplain understands the Pt. daughter-Keisha is expected soon. This chaplain will return after the family has the opportunity to spend time together. ? ?Chaplain Sallyanne Kuster ?[phone] ?

## 2021-09-27 NOTE — Progress Notes (Signed)
eLink Physician-Brief Progress Note ?Patient Name: [name] ?DOB: 1940/09/09 ?MRN: [id number] ? ? ?Date of Service ? Sep 19, 2021  ?HPI/Events of Note ? K+ 5.7  ?eICU Interventions ? Hyperkalemia protocol ordered.  ? ? ? ?  ? ?Kerry Kass Kaivon Livesey ?19-Sep-2021, 6:55 AM ?

## 2021-09-27 NOTE — Progress Notes (Signed)
Patient placed on comfort measures. Family informed this RN of readiness for terminal extubation. RT notified. Patient extubated at 1317. Time of death August 04, 1328 verified with Eloise Harman RN.  ?

## 2021-09-27 NOTE — Death Summary Note (Signed)
?DEATH SUMMARY  ? ?Patient Details  ?Name: [name] ?MRN: [id number] ?DOB: Feb 12, 1941 ? ?Admission/Discharge Information  ? ?Admit Date:  Sep 25, 2021  ?Date of Death: Date of Death: Sep 28, 2021  ?Time of Death: Time of Death: 1330  ?Length of Stay: 3  ?Referring Physician: [name], MD  ? ?Reason(s) for Hospitalization  ?Severe hypoxemic respiratory failure due to re-exacerbation of underlying interstitial lung disease. ? ?Acute renal failure and shock due to AE-ILD. ? 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  He was recently admitted from 4/14-4/18 with ILD exacerbation requiring prolonged steroid taper.  It seems once he finished this taper his DOE again got worse and now needed 6L at all times at home.   In ER required BIPAP through night and into this AM.  PCCM consulted to assist with further management. ? ?Unfortunately despite broad spectrum antibiotics, diuresis, and steroids, patient's respiratory status continued to decline and required intubation.  Subsequently went into multiorgan failure and family allowed him to pass naturally. ? ?Pertinent Labs and Studies  ?Significant Diagnostic Studies ?DG Abd 1 View ? ?Result Date: 09/02/2021 ?CLINICAL DATA:  81 year old male with pulmonary fibrosis, respiratory failure. Enteric tube placement. EXAM: ABDOMEN - 1 VIEW COMPARISON:  KUB 06/01/2020. FINDINGS: Portable AP semi upright view at 0810 hours. Enteric tube placed within the stomach. The tube loops in the gastric body region, terminating at the fundus. Non obstructed bowel gas pattern, but increased gas containing small bowel loops compared to last year. Abnormal lung bases, see portable chest reported separately. No acute osseous abnormality identified. IMPRESSION: Satisfactory enteric tube placement within the stomach.  Increased small bowel gas compared to last year, consider ileus. Electronically Signed   By: Genevie Ann M.D.   On: 09/02/2021 08:48  ? ?CT Angio Chest Pulmonary Embolism (PE) W or WO Contrast ? ?Result Date: 09/25/2021 ?CLINICAL DATA:  Pleuritic chest pain. Shortness of breath. Usual interstitial pneumonia. Clinical suspicion for pulmonary embolism. EXAM: CT ANGIOGRAPHY CHEST WITH CONTRAST TECHNIQUE: Multidetector CT imaging of the chest was performed using the standard protocol during bolus administration of intravenous contrast. Multiplanar CT image reconstructions and MIPs were obtained to evaluate the vascular anatomy. RADIATION DOSE REDUCTION: This exam was performed according to the departmental dose-optimization program which includes automated exposure control, adjustment of the mA and/or kV according to patient size and/or use of iterative reconstruction technique. CONTRAST:  2m OMNIPAQUE IOHEXOL 350 MG/ML SOLN COMPARISON:  07/30/2021 FINDINGS: Cardiovascular: No acute findings. Aortic and coronary atherosclerotic calcification noted. Mediastinum/Nodes: 1.2 cm right paratracheal lymph node, without significant change. No other pathologically enlarged lymph nodes or masses identified. Lungs/Pleura: Severe pulmonary fibrosis with large areas of honeycombing are again seen throughout both lungs. Superimposed diffuse bilateral pulmonary airspace disease is seen with significant worsening since previous study. This may be due to edema, infection, or ARDS. No evidence of pleural effusion. Upper abdomen: Stable small hiatal hernia. Musculoskeletal: No suspicious bone lesions identified. Review of the MIP images confirms the above findings. IMPRESSION: Severe pulmonary fibrosis with large areas of honeycombing. Superimposed diffuse bilateral pulmonary airspace disease, significantly increased since previous study. Differential diagnosis includes edema, infection, and ARDS. Stable mildly enlarged right paratracheal  lymph node, likely reactive in etiology. Electronically Signed   By: JMarlaine HindM.D.   On: 005-30-202315:[id number]  ? ?DG Chest Port 1 View ? ?  Result Date: 09/03/2021 ?CLINICAL DATA:  History of ARDS Hx of htn. Hx of VATS and pleuradesis right 2021. EXAM: PORTABLE CHEST 1 VIEW COMPARISON:  Chest x-ray Sep 02, 2021. FINDINGS: Endotracheal tube tip just below the level of the clavicular heads. Left IJ approach central venous catheter in similar position, with tip projecting at the superior right atrium. Gastric tube courses below the diaphragm in outside the field of view. A low lung volumes. Similar versus mildly progressed extensive patchy peripheral predominant interstitial airspace opacities throughout both lungs. Emphysema. Cardiomediastinal silhouette is within normal limits. No pathologic fracture. IMPRESSION: Similar versus mildly progressed extensive patchy peripheral predominant interstitial and airspace opacities throughout both lungs. Electronically Signed   By: Margaretha Sheffield M.D.   On: 09/03/2021 08:07  ? ?DG CHEST PORT 1 VIEW ? ?Result Date: 09/02/2021 ?CLINICAL DATA:  81 year old male with history of acute on chronic respiratory failure. Status post intubation and nasogastric tube placement. EXAM: PORTABLE CHEST 1 VIEW COMPARISON:  Chest x-ray 09/02/2021. FINDINGS: Patient has been intubated, with the tip of the endotracheal tube 5 cm above the carina. There is a left-sided internal jugular central venous catheter with tip terminating in the superior cavoatrial junction. A nasogastric tube is seen extending into the stomach, however, the tip of the nasogastric tube extends below the lower margin of the image. Patchy multifocal areas of interstitial prominence an ill-defined airspace disease noted throughout all aspects of the lungs bilaterally, similar to the recent prior study. Trace bilateral pleural effusions. No pneumothorax. Emphysematous changes are noted in the lungs. No dilatation of the pulmonary  vasculature. Heart size is normal. Upper mediastinal contours are within normal limits. Atherosclerotic calcifications in the thoracic aorta. IMPRESSION: 1. Support apparatus, as above. 2. The appearance the chest is most suggestive of severe multilobar bilateral pneumonia. 3. Aortic atherosclerosis. 4. Emphysema. Electronically Signed   By: Vinnie Langton M.D.   On: 09/02/2021 08:52  ? ?DG CHEST PORT 1 VIEW ? ?Result Date: 09/02/2021 ?CLINICAL DATA:  81 year old male with respiratory distress. EXAM: PORTABLE CHEST 1 VIEW COMPARISON:  Chest CTA 09/24/2021 and earlier. FINDINGS: Portable AP semi upright view at 0522 hours. Severe chronic lung disease with fibrosis and honeycombing as demonstrated by CTA yesterday. However, since 08/10/2021 increasing bilateral vague and confluent pulmonary opacity, also confirmed by CT. No superimposed pneumothorax or pleural effusion. Ventilation has not significantly changed from the scout view yesterday. Stable cardiac size and mediastinal contours. No acute osseous abnormality identified. Gas-filled stomach in the left upper quadrant. IMPRESSION: 1. Severe chronic lung disease with superimposed acute bilateral pulmonary opacity, stable from the CTA yesterday. Differential considerations remain bilateral pneumonia, asymmetric edema, ARDS. 2. No new cardiopulmonary abnormality identified. Electronically Signed   By: Genevie Ann M.D.   On: 09/02/2021 05:37  ? ?DG Chest Port 1 View ? ?Result Date: 09/18/2021 ?CLINICAL DATA:  Shortness of breath. EXAM: PORTABLE CHEST 1 VIEW COMPARISON:  Chest radiograph dated 08/10/2021. FINDINGS: Diffuse interstitial prominence and fibrosis. Faint increased density in the subpleural right lung may be artifactual. Developing infiltrate is not excluded. No new consolidative changes. There is no pleural effusion pneumothorax. The cardiac silhouette is within limits. Atherosclerotic calcification of the aorta. No acute osseous pathology. IMPRESSION: Diffuse  interstitial prominence and fibrosis. No consolidative changes. Electronically Signed   By: Anner Crete M.D.   On: 09/16/2021 01:10  ? ?DG Swallowing Func-Speech Pathology ? ?Result Date: 08/13/2021 ?Table

## 2021-09-27 DEATH — deceased

## 2021-10-01 IMAGING — DX DG CHEST 1V PORT
1 series · 1 of 1 positions shown · non-contrast
Comparison: 05/31/2019

CLINICAL DATA: Follow-up pneumothorax.

EXAM:
PORTABLE CHEST 1 VIEW

[chest ap]
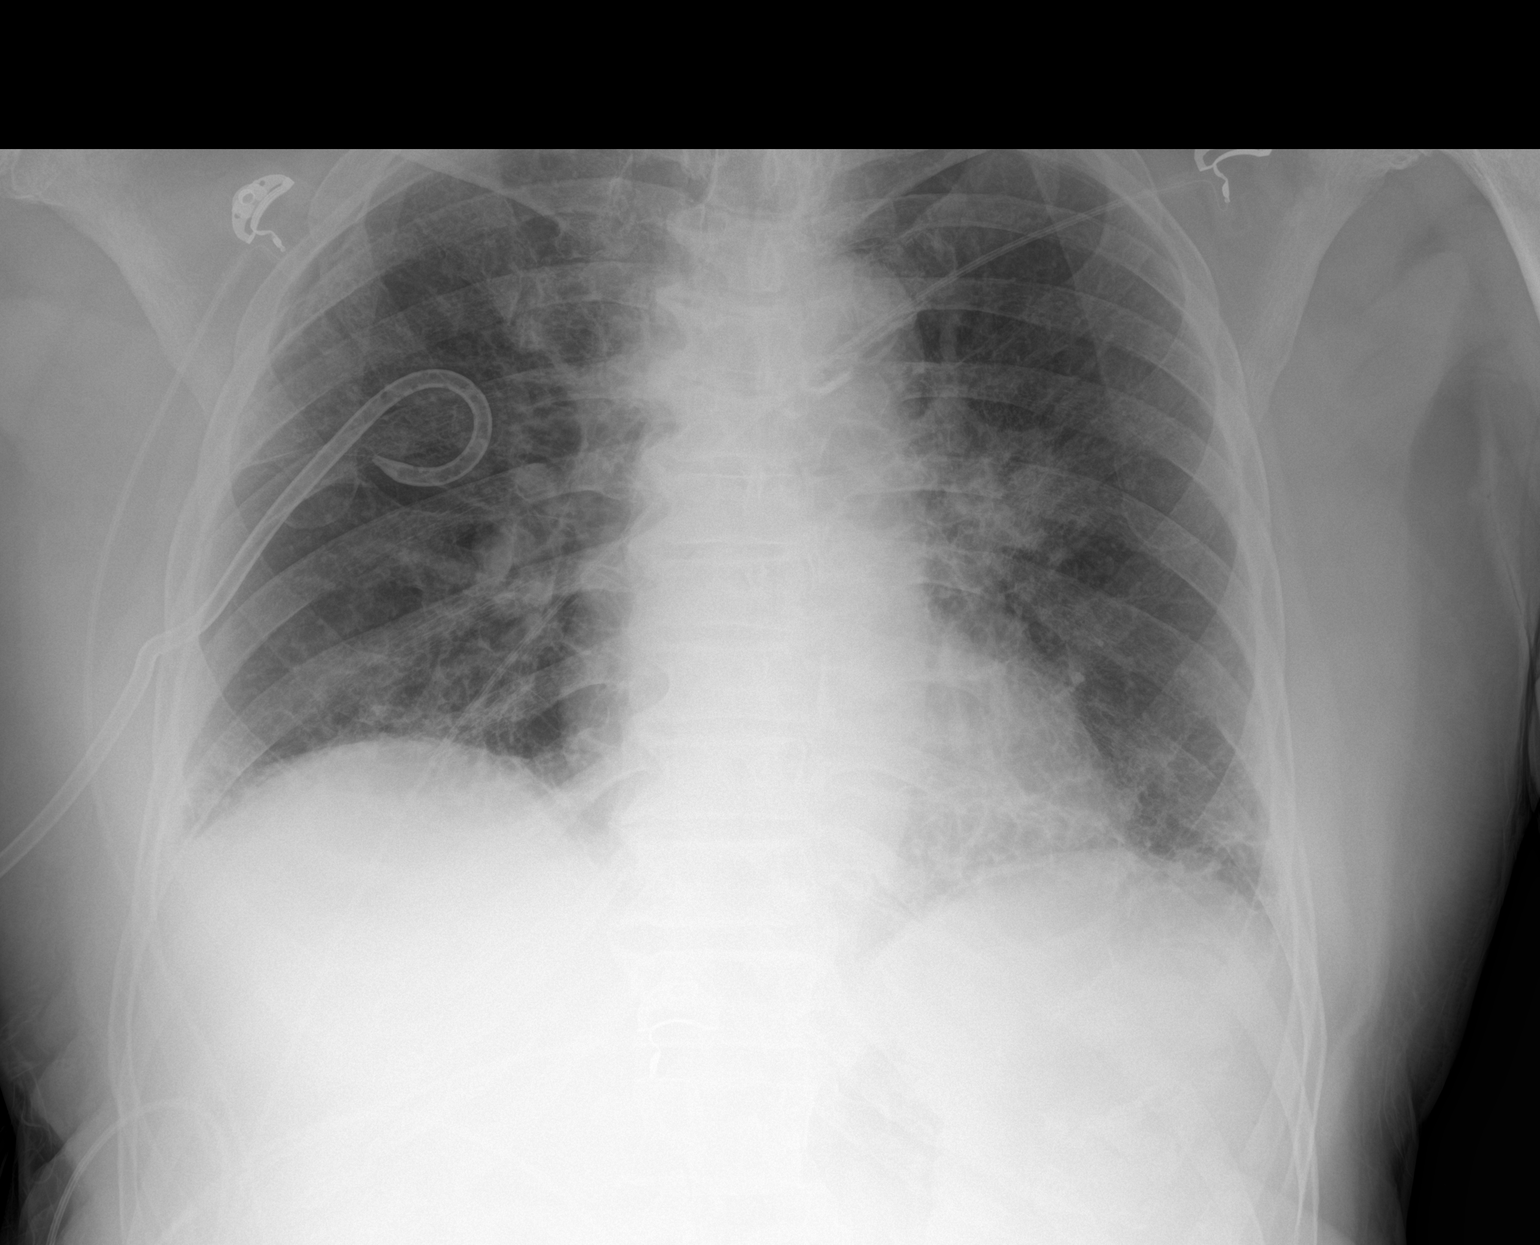

[1 of 1 positions shown; findings below may reference images not displayed]

FINDINGS: The right chest tube is stable. Small residual lateral pneumothorax.

Chronic underlying emphysema and pulmonary scarring.
IMPRESSION: Stable right chest tube with small residual lateral pneumothorax.

## 2021-10-02 IMAGING — DX DG CHEST 1V PORT
1 series · 1 of 1 positions shown · non-contrast
Comparison: Same day study

CLINICAL DATA: Follow-up pneumothorax

EXAM:
PORTABLE CHEST 1 VIEW

[chest ap]
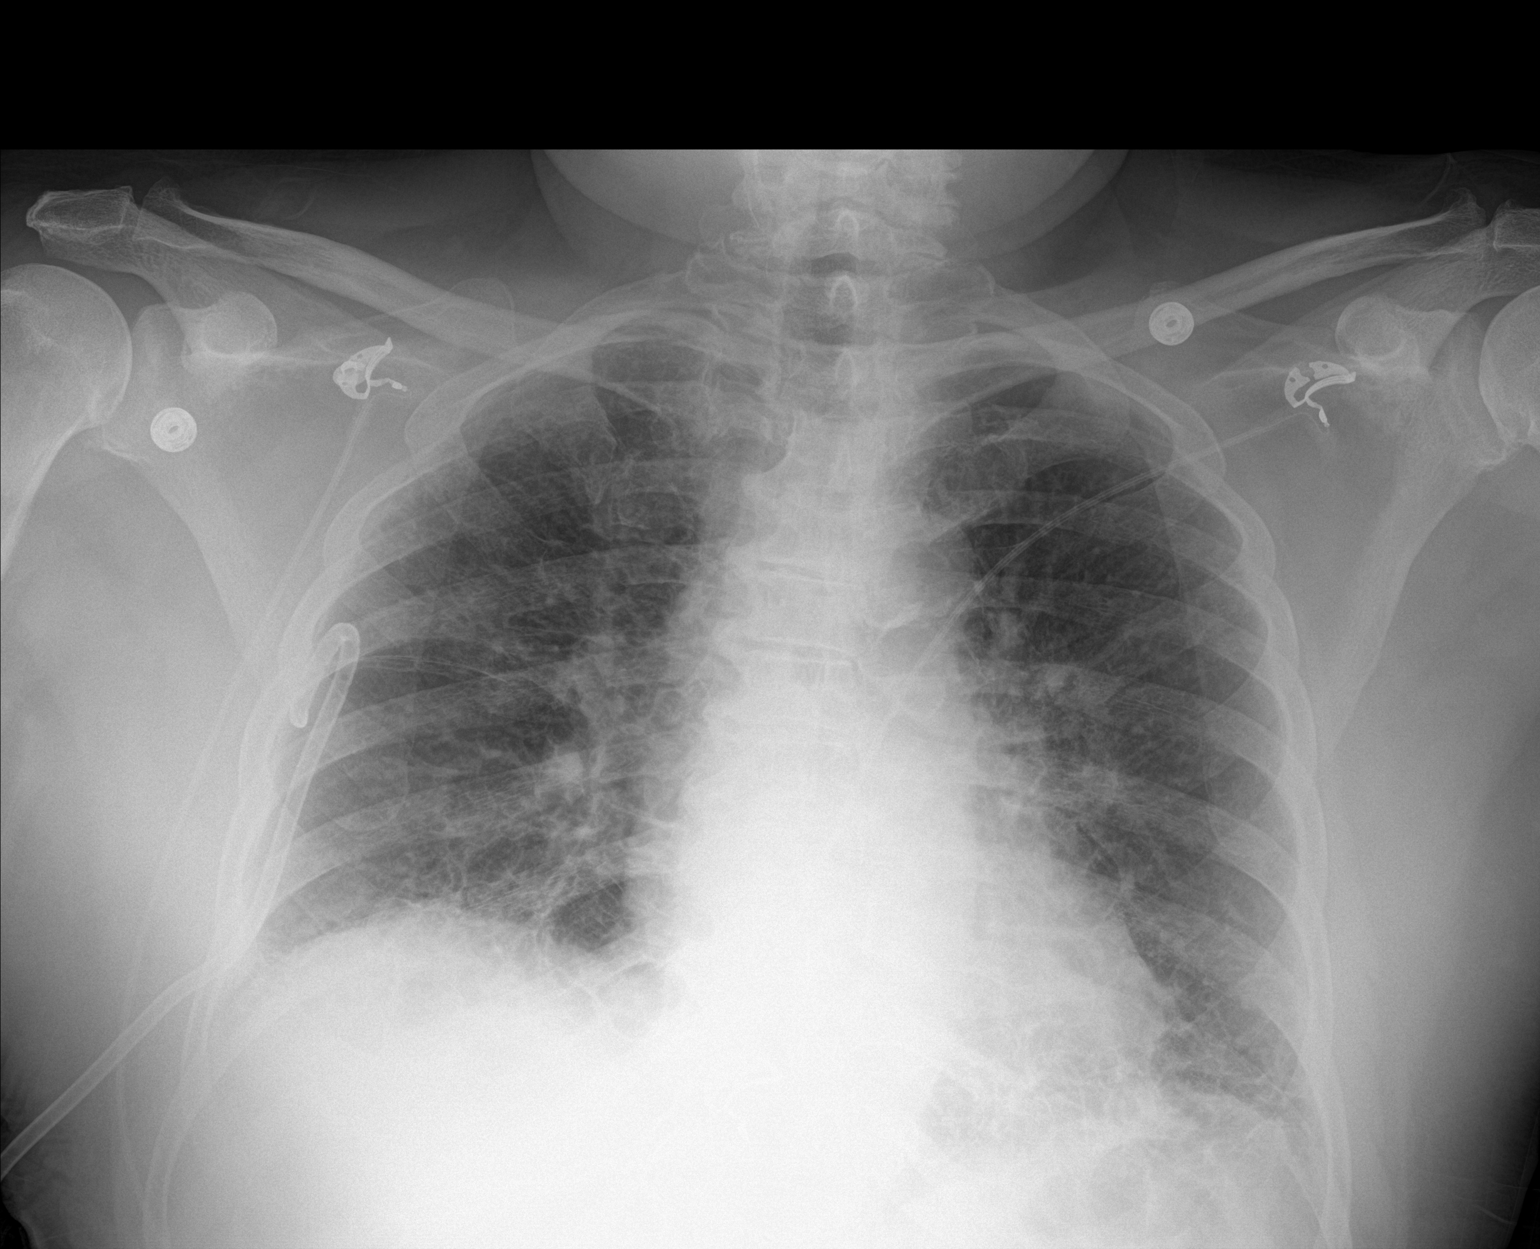

[1 of 1 positions shown; findings below may reference images not displayed]

FINDINGS: Interval placement of right-sided pigtail pleural drainage catheter
with near complete resolution of previously seen right-sided
pneumothorax. A tiny right apical pneumothorax component persists
involving far less than 5% of the lung volume. No left-sided
pneumothorax. Stable heart size. No shift of the mediastinal
structures. Chronic diffuse interstitial prominence bilaterally.
IMPRESSION: Interval placement of right-sided pigtail pleural drainage catheter
with near complete resolution of previously seen right-sided
pneumothorax. A tiny apical component of right-sided pneumothorax
persists, measuring less than 5%.

## 2021-10-03 IMAGING — DX DG CHEST 1V PORT
1 series · 1 of 1 positions shown · non-contrast
Comparison: 06/02/2019

CLINICAL DATA: Follow-up pneumothorax.

EXAM:
PORTABLE CHEST 1 VIEW

[chest ap]
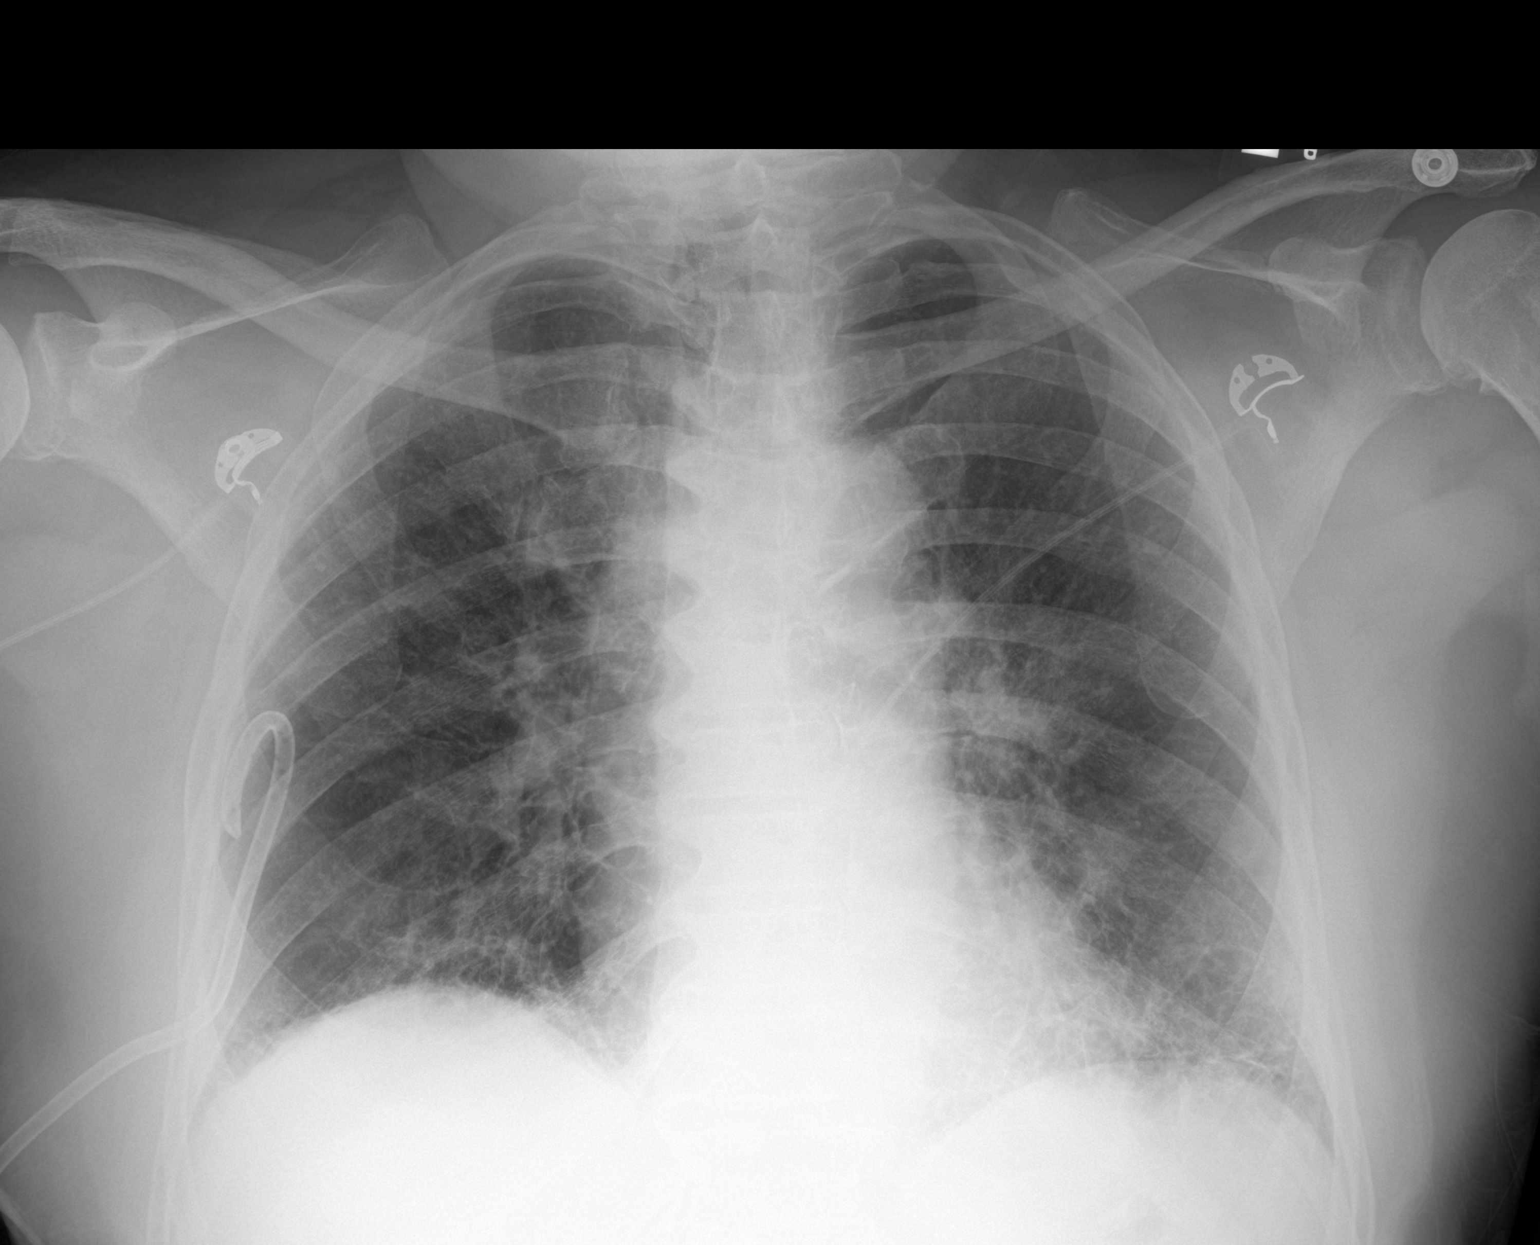

[1 of 1 positions shown; findings below may reference images not displayed]

FINDINGS: The right-sided chest tube is stable. No definite right-sided
pneumothorax.

Stable underlying emphysematous changes and pulmonary scarring. No
definite infiltrates or effusions.
IMPRESSION: Stable right-sided chest tube without definite pneumothorax.

## 2021-10-31 ENCOUNTER — Telehealth: Payer: Self-pay | Admitting: Internal Medicine

## 2021-12-10 NOTE — Telephone Encounter (Signed)
Encounter opened in error

## 2022-10-02 IMAGING — CR DG PELVIS 1-2V
1 series · 1 of 1 positions shown · non-contrast
Comparison: None.

CLINICAL DATA: Abdominal pain, LLQ x 2 weeks

EXAM:
PELVIS - 1-2 VIEW

[t pelvis a.p.]
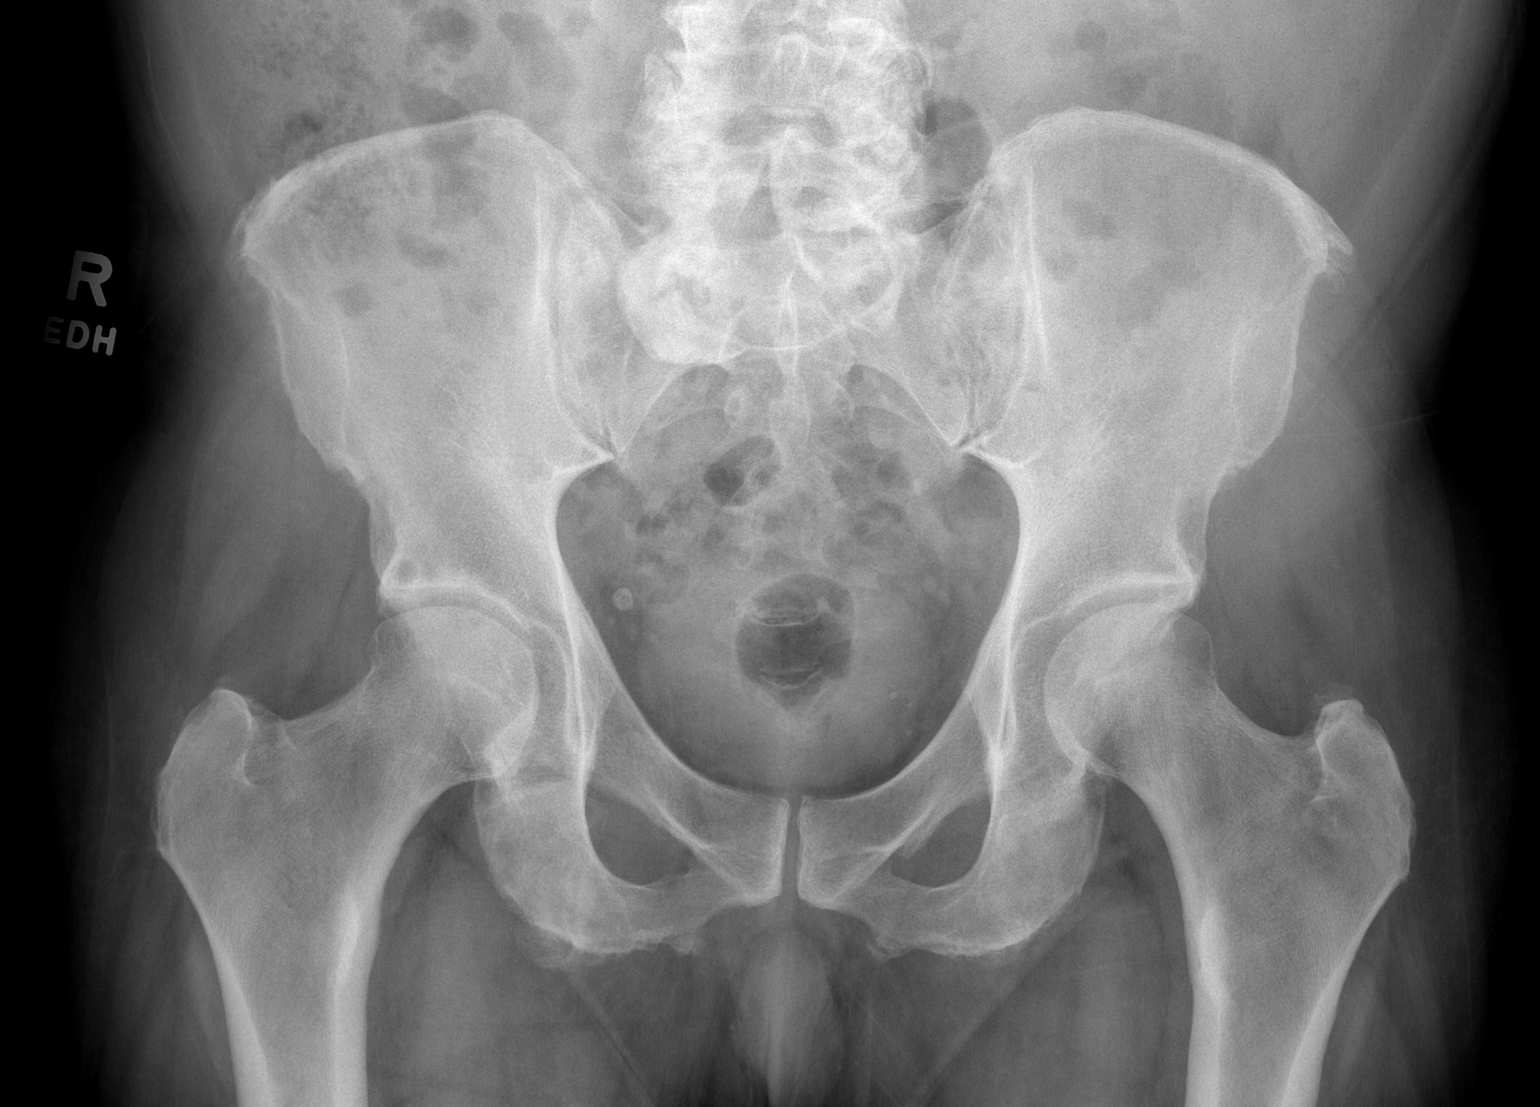

[1 of 1 positions shown; findings below may reference images not displayed]

FINDINGS: Advanced degenerative changes of the visualized lower lumbar spine.
No dilated loops of bowel identified within the visualized pelvis.
Right pelvic calcifications likely due to phleboliths.
IMPRESSION: No acute abnormality of the pelvis.
